# Patient Record
Sex: Female | Born: 1970 | Race: White | Hispanic: No | Smoking: Former smoker
Health system: Southern US, Community
[De-identification: ages and names within clinical notes are randomized; demographics above are authoritative.]

## PROBLEM LIST (undated history)

## (undated) DIAGNOSIS — E785 Hyperlipidemia, unspecified: Secondary | ICD-10-CM

## (undated) DIAGNOSIS — E119 Type 2 diabetes mellitus without complications: Secondary | ICD-10-CM

## (undated) HISTORY — DX: Hyperlipidemia, unspecified: E78.5

## (undated) HISTORY — PX: ABDOMINAL HYSTERECTOMY: SHX81

## (undated) HISTORY — DX: Type 2 diabetes mellitus without complications: E11.9

## (undated) HISTORY — PX: CHOLECYSTECTOMY: SHX55

---

## 1996-10-11 HISTORY — PX: HERNIA REPAIR: SHX51

## 2008-08-02 ENCOUNTER — Emergency Department: Payer: Self-pay | Admitting: Emergency Medicine

## 2010-03-20 ENCOUNTER — Emergency Department: Payer: Self-pay | Admitting: Emergency Medicine

## 2010-05-23 ENCOUNTER — Emergency Department: Payer: Self-pay | Admitting: Emergency Medicine

## 2010-06-12 ENCOUNTER — Emergency Department: Payer: Self-pay | Admitting: Emergency Medicine

## 2013-07-03 ENCOUNTER — Emergency Department: Payer: Self-pay | Admitting: Emergency Medicine

## 2013-07-03 LAB — URINALYSIS, COMPLETE
Bacteria: NONE SEEN
Bilirubin,UR: NEGATIVE
Glucose,UR: NEGATIVE mg/dL (ref 0–75)
Ketone: NEGATIVE
Leukocyte Esterase: NEGATIVE
Nitrite: NEGATIVE
Ph: 6 (ref 4.5–8.0)
Protein: NEGATIVE
RBC,UR: 2 /HPF (ref 0–5)
Specific Gravity: 1.011 (ref 1.003–1.030)
Squamous Epithelial: 1
WBC UR: 2 /HPF (ref 0–5)

## 2013-07-03 LAB — CBC
HCT: 36.2 % (ref 35.0–47.0)
HGB: 12.1 g/dL (ref 12.0–16.0)
MCH: 26.7 pg (ref 26.0–34.0)
MCHC: 33.5 g/dL (ref 32.0–36.0)
MCV: 80 fL (ref 80–100)
Platelet: 347 10*3/uL (ref 150–440)
RBC: 4.53 10*6/uL (ref 3.80–5.20)
RDW: 16.1 % — ABNORMAL HIGH (ref 11.5–14.5)
WBC: 12.5 10*3/uL — ABNORMAL HIGH (ref 3.6–11.0)

## 2013-07-03 LAB — COMPREHENSIVE METABOLIC PANEL
Albumin: 3.4 g/dL (ref 3.4–5.0)
Alkaline Phosphatase: 123 U/L (ref 50–136)
Anion Gap: 6 — ABNORMAL LOW (ref 7–16)
BUN: 11 mg/dL (ref 7–18)
Bilirubin,Total: 0.5 mg/dL (ref 0.2–1.0)
Calcium, Total: 9.2 mg/dL (ref 8.5–10.1)
Chloride: 103 mmol/L (ref 98–107)
Co2: 27 mmol/L (ref 21–32)
Creatinine: 1 mg/dL (ref 0.60–1.30)
EGFR (African American): >60
EGFR (Non-African Amer.): >60
Glucose: 153 mg/dL — ABNORMAL HIGH (ref 65–99)
Osmolality: 274 (ref 275–301)
Potassium: 3.6 mmol/L (ref 3.5–5.1)
SGOT(AST): 17 U/L (ref 15–37)
SGPT (ALT): 21 U/L (ref 12–78)
Sodium: 136 mmol/L (ref 136–145)
Total Protein: 8 g/dL (ref 6.4–8.2)

## 2014-03-03 ENCOUNTER — Emergency Department: Payer: Self-pay | Admitting: Emergency Medicine

## 2014-03-03 LAB — BASIC METABOLIC PANEL
Anion Gap: 8 (ref 7–16)
BUN: 10 mg/dL (ref 7–18)
Calcium, Total: 8.9 mg/dL (ref 8.5–10.1)
Chloride: 104 mmol/L (ref 98–107)
Co2: 27 mmol/L (ref 21–32)
Creatinine: 0.77 mg/dL (ref 0.60–1.30)
EGFR (African American): >60
EGFR (Non-African Amer.): >60
Glucose: 113 mg/dL — ABNORMAL HIGH (ref 65–99)
Osmolality: 277 (ref 275–301)
Potassium: 3.7 mmol/L (ref 3.5–5.1)
Sodium: 139 mmol/L (ref 136–145)

## 2014-03-03 LAB — CBC
HCT: 37.1 % (ref 35.0–47.0)
HGB: 11.9 g/dL — ABNORMAL LOW (ref 12.0–16.0)
MCH: 26.1 pg (ref 26.0–34.0)
MCHC: 32.1 g/dL (ref 32.0–36.0)
MCV: 81 fL (ref 80–100)
Platelet: 310 10*3/uL (ref 150–440)
RBC: 4.56 10*6/uL (ref 3.80–5.20)
RDW: 17 % — ABNORMAL HIGH (ref 11.5–14.5)
WBC: 7.5 10*3/uL (ref 3.6–11.0)

## 2014-03-03 LAB — TROPONIN I: Troponin-I: <0.02 ng/mL

## 2014-05-15 ENCOUNTER — Emergency Department: Payer: Self-pay | Admitting: Emergency Medicine

## 2014-05-15 LAB — URINALYSIS, COMPLETE
Bilirubin,UR: NEGATIVE
Glucose,UR: NEGATIVE mg/dL (ref 0–75)
Ketone: NEGATIVE
Nitrite: POSITIVE
Ph: 5 (ref 4.5–8.0)
Protein: 100
RBC,UR: 31 /HPF (ref 0–5)
Specific Gravity: 1.013 (ref 1.003–1.030)
Squamous Epithelial: 10
WBC UR: 632 /HPF (ref 0–5)

## 2014-05-18 LAB — URINE CULTURE

## 2015-02-25 DIAGNOSIS — R519 Headache, unspecified: Secondary | ICD-10-CM | POA: Insufficient documentation

## 2015-09-18 DIAGNOSIS — G44229 Chronic tension-type headache, not intractable: Secondary | ICD-10-CM | POA: Insufficient documentation

## 2016-10-17 ENCOUNTER — Emergency Department
Admission: EM | Admit: 2016-10-17 | Discharge: 2016-10-17 | Disposition: A | Discharge status: Home / Self Care | Payer: Self-pay | Attending: Emergency Medicine | Admitting: Emergency Medicine

## 2016-10-17 DIAGNOSIS — B9789 Other viral agents as the cause of diseases classified elsewhere: Secondary | ICD-10-CM

## 2016-10-17 DIAGNOSIS — J069 Acute upper respiratory infection, unspecified: Secondary | ICD-10-CM | POA: Insufficient documentation

## 2016-10-17 MED ORDER — FEXOFENADINE-PSEUDOEPHED ER 60-120 MG PO TB12: 1.0000 | ORAL_TABLET | Freq: Two times a day (BID) | ORAL | 0 refills | Status: DC

## 2016-10-17 MED ORDER — BENZONATATE 100 MG PO CAPS: 200.0000 mg | ORAL_CAPSULE | Freq: Three times a day (TID) | ORAL | 0 refills | Status: DC

## 2016-10-17 NOTE — ED Notes (Signed)
See triage note states she has been taking care of grandchild which was dx'd with URI this week  Now has been having some sinus pressure congestion and cough

## 2016-10-17 NOTE — ED Triage Notes (Signed)
Pt reports having nasal congestion and drainage since Thursday - non-productive cough - pt reports that both her grandchildren have had the same illness - appears in no acute distress at this time

## 2016-10-17 NOTE — ED Provider Notes (Signed)
[facility] Emergency Department Provider Note   ____________________________________________   First MD Initiated Contact with Patient 10/17/16 1403     (approximate)  I have reviewed the triage vital signs and the nursing notes.   HISTORY  Chief Complaint Cough and Nasal Congestion    HPI Lisa Howell is a 46 y.o. female patient complain of nasal congestion, postnasal drainage, and cough for 3 days. Patient denies nausea vomiting diarrhea. Patient states she's been exposed to her grandchildren who had the same illness. No palliative measures taken for complaint. Patient denies pain with this complaint.  History reviewed. No pertinent past medical history.  There are no active problems to display for this patient.   Past Surgical History:  Procedure Laterality Date  . CHOLECYSTECTOMY      Prior to Admission medications   Medication Sig Start Date End Date Taking? Authorizing Provider  benzonatate (TESSALON PERLES) 100 MG capsule Take 2 capsules (200 mg total) by mouth 3 (three) times daily. 10/17/16   Sable Feil, PA-C  fexofenadine-pseudoephedrine (ALLEGRA-D) 60-120 MG 12 hr tablet Take 1 tablet by mouth 2 (two) times daily. 10/17/16   Sable Feil, PA-C    Allergies Patient has no known allergies.  No family history on file.  Social History Social History  Substance Use Topics  . Smoking status: Never Smoker  . Smokeless tobacco: Never Used  . Alcohol use No    Review of Systems Constitutional: No fever/chills Eyes: No visual changes. ENT: Nasal congestion  Cardiovascular: Denies chest pain. Respiratory: Denies shortness of breath. Nonproductive cough Gastrointestinal: No abdominal pain.  No nausea, no vomiting.  No diarrhea.  No constipation. Genitourinary: Negative for dysuria. Musculoskeletal: Negative for back pain. Skin: Negative for rash. Neurological: Negative for headaches, focal weakness or  numbness.   ____________________________________________   PHYSICAL EXAM:  VITAL SIGNS: ED Triage Vitals [10/17/16 1335]  Enc Vitals Group     BP (!) 146/91     Pulse Rate 93     Resp 16     Temp 98.1 F (36.7 C)     Temp Source Oral     SpO2 97 %     Weight 235 lb (106.6 kg)     Height 5\' 1"  (1.549 m)     Head Circumference      Peak Flow      Pain Score 0     Pain Loc      Pain Edu?      Excl. in [place]?     Constitutional: Alert and oriented. Well appearing and in no acute distress. Eyes: Conjunctivae are normal. PERRL. EOMI. Head: Atraumatic. Nose:Bilateral maxillary guarding. Edematous nasal turbinates. Clear rhinorrhea. Mouth/Throat: Mucous membranes are moist.  Oropharynx non-erythematous. Postnasal drainage. Neck: No stridor.  No cervical spine tenderness to palpation. Hematological/Lymphatic/Immunilogical: No cervical lymphadenopathy. Cardiovascular: Normal rate, regular rhythm. Grossly normal heart sounds.  Good peripheral circulation. Respiratory: Normal respiratory effort.  No retractions. Lungs CTAB. Nonproductive cough with deep inspirations. Gastrointestinal: Soft and nontender. No distention. No abdominal bruits. No CVA tenderness. Musculoskeletal: No lower extremity tenderness nor edema.  No joint effusions. Neurologic:  Normal speech and language. No gross focal neurologic deficits are appreciated. No gait instability. Skin:  Skin is warm, dry and intact. No rash noted. Psychiatric: Mood and affect are normal. Speech and behavior are normal.  ____________________________________________   LABS (all labs ordered are listed, but only abnormal results are displayed)  Labs Reviewed - No data to display ____________________________________________  EKG   ____________________________________________  RADIOLOGY   ____________________________________________   PROCEDURES  Procedure(s) performed: None  Procedures  Critical Care performed:  No  ____________________________________________   INITIAL IMPRESSION / ASSESSMENT AND PLAN /  Patient given a work note. [facility].ED COURSE  Pertinent labs & imaging results that were available during my care of the patient were reviewed by me and considered in my medical decision making (see chart for details).  Upper respiratory infection with cough. Patient given discharge care instructions. Patient given a prescription for Sudafed and Tessalon Perles.  Clinical Course      ____________________________________________   FINAL CLINICAL IMPRESSION(S) / ED DIAGNOSES  Final diagnoses:  Viral URI with cough      NEW MEDICATIONS STARTED DURING THIS VISIT:  New Prescriptions   BENZONATATE (TESSALON PERLES) 100 MG CAPSULE    Take 2 capsules (200 mg total) by mouth 3 (three) times daily.   FEXOFENADINE-PSEUDOEPHEDRINE (ALLEGRA-D) 60-120 MG 12 HR TABLET    Take 1 tablet by mouth 2 (two) times daily.     Note:  This document was prepared using Dragon voice recognition software and may include unintentional dictation errors.    Sable Feil, PA-C 10/17/16 1415    Eula Listen, MD 10/17/16 [phone]

## 2016-11-07 DIAGNOSIS — X501XXA Overexertion from prolonged static or awkward postures, initial encounter: Secondary | ICD-10-CM | POA: Insufficient documentation

## 2016-11-07 DIAGNOSIS — Y999 Unspecified external cause status: Secondary | ICD-10-CM | POA: Insufficient documentation

## 2016-11-07 DIAGNOSIS — J069 Acute upper respiratory infection, unspecified: Secondary | ICD-10-CM | POA: Insufficient documentation

## 2016-11-07 DIAGNOSIS — S29012A Strain of muscle and tendon of back wall of thorax, initial encounter: Secondary | ICD-10-CM | POA: Insufficient documentation

## 2016-11-07 DIAGNOSIS — Y929 Unspecified place or not applicable: Secondary | ICD-10-CM | POA: Insufficient documentation

## 2016-11-07 DIAGNOSIS — Y9389 Activity, other specified: Secondary | ICD-10-CM | POA: Insufficient documentation

## 2016-11-08 ENCOUNTER — Encounter: Payer: Self-pay | Admitting: Emergency Medicine

## 2016-11-08 ENCOUNTER — Emergency Department: Payer: Self-pay

## 2016-11-08 ENCOUNTER — Emergency Department
Admission: EM | Admit: 2016-11-08 | Discharge: 2016-11-08 | Disposition: A | Discharge status: Home / Self Care | Payer: Self-pay | Attending: Emergency Medicine | Admitting: Emergency Medicine

## 2016-11-08 DIAGNOSIS — B9789 Other viral agents as the cause of diseases classified elsewhere: Secondary | ICD-10-CM

## 2016-11-08 DIAGNOSIS — J069 Acute upper respiratory infection, unspecified: Secondary | ICD-10-CM

## 2016-11-08 DIAGNOSIS — S29019A Strain of muscle and tendon of unspecified wall of thorax, initial encounter: Secondary | ICD-10-CM

## 2016-11-08 MED ORDER — GUAIFENESIN-CODEINE 100-10 MG/5ML PO SOLN: 5.0000 mL | ORAL | 0 refills | Status: DC | PRN

## 2016-11-08 MED ORDER — IBUPROFEN 600 MG PO TABS: 600.0000 mg | ORAL_TABLET | Freq: Three times a day (TID) | ORAL | 0 refills | Status: DC | PRN

## 2016-11-08 MED ORDER — DIAZEPAM 2 MG PO TABS: 2.0000 mg | ORAL_TABLET | Freq: Three times a day (TID) | ORAL | 0 refills | Status: DC | PRN

## 2016-11-08 NOTE — ED Triage Notes (Addendum)
Pt c/o sinus congestion/cough for 4 weeks; denies fever; several days ago she felt popping sensation to right mid back during coughing spell; now painful to move; short of breath with activity; lungs clear in triage; pt seen here 3 weeks ago and prescribed tessalon pearles and allegra D; has taken with no relief; has not followed up with her doctor

## 2016-11-08 NOTE — Discharge Instructions (Signed)
Follow-up with [facility] or your primary care if any continued problems. Use ice or heat to your back as needed for comfort. Begin taking diazepam 2 mg 1 tablet 3 times a day for the next 3 days. Ibuprofen with food as needed for pain and inflammation. Robitussin-AC for cough. This medication has a narcotic in it and should not be taken while driving or operating machinery.

## 2016-11-08 NOTE — ED Provider Notes (Signed)
[facility] Emergency Department Provider Note   ____________________________________________   First MD Initiated Contact with Patient 11/08/16 [phone]     (approximate)  I have reviewed the triage vital signs and the nursing notes.   HISTORY  Chief Complaint Cough; Shortness of Breath; Back Pain; and Nasal Congestion   HPI Lisa Howell is a 46 y.o. female is here complaining of cough and congestion for approximately 4 weeks. Patient states that she also felt a popping sensation in her right mid back during a coughing spell recently and now it is painful for her to move. Patient states she has some shortness of breath with activity. She was seen here 3 weeks ago for upper respiratory infection at which time she was given Allegra-D and Tessalon Perles. Patient states that the Allegra-D help with her symptoms. She cannot see any improvement with the Tessalon as far as controlling her cough. She denies any continued fever or chills. Patient is a former smoker that discontinued smoking approximately 19 years ago. She denies any previous injury to her back and states that it only started with coughing. Patient rates her pain as a 10 over 10.   History reviewed. No pertinent past medical history.  There are no active problems to display for this patient.   Past Surgical History:  Procedure Laterality Date  . ABDOMINAL HYSTERECTOMY    . CESAREAN SECTION    . CHOLECYSTECTOMY      Prior to Admission medications   Medication Sig Start Date End Date Taking? Authorizing Provider  diazepam (VALIUM) 2 MG tablet Take 1 tablet (2 mg total) by mouth every 8 (eight) hours as needed for muscle spasms. 11/08/16   Johnn Hai, PA-C  guaiFENesin-codeine 100-10 MG/5ML syrup Take 5 mLs by mouth every 4 (four) hours as needed. 11/08/16   Johnn Hai, PA-C  ibuprofen (ADVIL,MOTRIN) 600 MG tablet Take 1 tablet (600 mg total) by mouth every 8 (eight) hours as needed.  11/08/16   Johnn Hai, PA-C    Allergies Patient has no known allergies.  History reviewed. No pertinent family history.  Social History Social History  Substance Use Topics  . Smoking status: Never Smoker  . Smokeless tobacco: Never Used  . Alcohol use No    Review of Systems Constitutional: No fever/chills Eyes: No visual changes. ENT: No sore throat. Cardiovascular: Denies chest pain. Respiratory: Denies shortness of breath. Positive for cough. Gastrointestinal: No abdominal pain.  No nausea, no vomiting.   Genitourinary: Negative for dysuria. Musculoskeletal: Positive for  back pain. Skin: Negative for rash. Neurological: Negative for headaches, focal weakness or numbness.  10-point ROS otherwise negative.  ____________________________________________   PHYSICAL EXAM:  VITAL SIGNS: ED Triage Vitals  Enc Vitals Group     BP 11/08/16 0008 (!) 145/89     Pulse Rate 11/08/16 0008 91     Resp 11/08/16 0008 17     Temp 11/08/16 0008 98.3 F (36.8 C)     Temp Source 11/08/16 0008 Oral     SpO2 11/08/16 0008 97 %     Weight 11/08/16 0008 235 lb (106.6 kg)     Height 11/08/16 0008 5\' 1"  (1.549 m)     Head Circumference --      Peak Flow --      Pain Score 11/08/16 0019 10     Pain Loc --      Pain Edu? --      Excl. in [place]? --  Constitutional: Alert and oriented. Well appearing and in no acute distress. Eyes: Conjunctivae are normal. PERRL. EOMI. Head: Atraumatic. Nose: No congestion/rhinnorhea. Mouth/Throat: Mucous membranes are moist.  Oropharynx non-erythematous. Neck: No stridor.   Hematological/Lymphatic/Immunilogical: No cervical lymphadenopathy. Cardiovascular: Normal rate, regular rhythm. Grossly normal heart sounds.  Good peripheral circulation. Respiratory: Normal respiratory effort.  No retractions. Lungs CTAB.Case COUGH is heard. Gastrointestinal: Soft and nontender. No distention.  Musculoskeletal: On examination of the back there is no  gross deformity noted. There is some tenderness of the paravertebral muscles bilaterally. There is no active muscle spasm seen. There is some difficulty with range of motion secondary to pain and patient does look uncomfortable sitting in the exam room. Patient is still Able to move upper and lower extremities without any difficulty and normal gait was noted. Neurologic:  Normal speech and language. No gross focal neurologic deficits are appreciated. No gait instability. Skin:  Skin is warm, dry and intact. No rash noted. Psychiatric: Mood and affect are normal. Speech and behavior are normal.  ____________________________________________   LABS (all labs ordered are listed, but only abnormal results are displayed)  Labs Reviewed - No data to display  RADIOLOGY X-ray per radiologist shows no active cardiopulmonary disease. I, Johnn Hai, personally viewed and evaluated these images (plain radiographs) as part of my medical decision making, as well as reviewing the written report by the radiologist.  ____________________________________________   PROCEDURES  Procedure(s) performed: None  Procedures  Critical Care performed: No  ____________________________________________   INITIAL IMPRESSION / ASSESSMENT AND PLAN / ED COURSE  Pertinent labs & imaging results that were available during my care of the patient were reviewed by me and considered in my medical decision making (see chart for details).  Patient was reassured that her chest x-ray did not show any signs of pneumonia. We discussed the probability that she did pull muscles with all the forceful coughing that she has been doing. Patient was given a prescription for Valium 2 mg one every 8 hours as needed for muscle spasms for the next 3 days. She is also given a prescription for guaifenesin with codeine as needed for cough and congestion. She will also take over-the-counter ibuprofen as needed for pain and inflammation.  She is to follow-up with [facility] or her primary care doctor if any continued problems. She is encouraged to use ice or heat to her back as needed for comfort.      ____________________________________________   FINAL CLINICAL IMPRESSION(S) / ED DIAGNOSES  Final diagnoses:  Acute thoracic myofascial strain, initial encounter  Viral URI with cough      NEW MEDICATIONS STARTED DURING THIS VISIT:  Discharge Medication List as of 11/08/2016  7:25 AM    START taking these medications   Details  diazepam (VALIUM) 2 MG tablet Take 1 tablet (2 mg total) by mouth every 8 (eight) hours as needed for muscle spasms., Starting Mon 11/08/2016, Print    guaiFENesin-codeine 100-10 MG/5ML syrup Take 5 mLs by mouth every 4 (four) hours as needed., Starting Mon 11/08/2016, Print    ibuprofen (ADVIL,MOTRIN) 600 MG tablet Take 1 tablet (600 mg total) by mouth every 8 (eight) hours as needed., Starting Mon 11/08/2016, Print         Note:  This document was prepared using Dragon voice recognition software and may include unintentional dictation errors.    Johnn Hai, PA-C 11/08/16 1355    Lavonia Drafts, MD 11/08/16 [phone]

## 2016-11-08 NOTE — ED Notes (Signed)
See triage note  States she was seen couple of weeks ago states she has not had any fever or other sx's   But is not able to get rid of cough  States cough is non prod  Felt a pop to mid back  Increased pain with movement  Lungs clear

## 2017-08-16 ENCOUNTER — Encounter: Payer: Self-pay | Admitting: Emergency Medicine

## 2017-08-16 ENCOUNTER — Emergency Department
Admission: EM | Admit: 2017-08-16 | Discharge: 2017-08-16 | Disposition: A | Discharge status: Home / Self Care | Payer: Self-pay | Attending: Emergency Medicine | Admitting: Emergency Medicine

## 2017-08-16 ENCOUNTER — Emergency Department: Payer: Self-pay

## 2017-08-16 DIAGNOSIS — J069 Acute upper respiratory infection, unspecified: Secondary | ICD-10-CM | POA: Insufficient documentation

## 2017-08-16 MED ORDER — GUAIFENESIN-CODEINE 100-10 MG/5ML PO SOLN: 10.0000 mL | Freq: Four times a day (QID) | ORAL | 0 refills | Status: AC | PRN

## 2017-08-16 NOTE — ED Triage Notes (Signed)
Patient presents to ED via POV from home with c/o cough, nasal drainage and headache since Sunday. Even and non labored respirations noted.

## 2017-08-16 NOTE — ED Provider Notes (Signed)
[facility] Emergency Department Provider Note  ____________________________________________  Time seen: Approximately 6:44 PM  I have reviewed the triage vital signs and the nursing notes.   HISTORY  Chief Complaint Cough   HPI Lisa Howell is a 46 y.o. female who presents to the emergency department for treatment and evaluation ofcough, congestion, headache, and low-grade intermittent fever for the past 2 days. 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 She denies a significant social history of smoking and denies medical history of diabetes or other chronic illness. She takes ibuprofen on a routine basis secondary to frequent headaches but otherwise has no daily medications. She reports no known drug allergies.   History reviewed. No pertinent past medical history.  There are no active problems to display for this patient.   Past Surgical History:  Procedure Laterality Date  . ABDOMINAL HYSTERECTOMY    . CESAREAN SECTION    . CHOLECYSTECTOMY      Prior to Admission medications   Medication Sig Start Date End Date Taking? Authorizing Provider  diazepam (VALIUM) 2 MG tablet Take 1 tablet (2 mg total) by mouth every 8 (eight) hours as needed for muscle spasms. 11/08/16   Johnn Hai, PA-C  guaiFENesin-codeine 100-10 MG/5ML syrup Take 10 mLs every 6 (six) hours as needed for up to 3 days by mouth for cough. 08/16/17 08/19/17  Lonzo Saulter, Dessa Phi, FNP  ibuprofen (ADVIL,MOTRIN) 600 MG tablet Take 1 tablet (600 mg total) by mouth every 8 (eight) hours as needed. 11/08/16   Johnn Hai, PA-C    Allergies Patient has no known allergies.  No family history on file.  Social History Social History   Tobacco Use  . Smoking status: Never Smoker  . Smokeless tobacco: Never Used  Substance Use Topics  . Alcohol use: No  . Drug use: No     Review of Systems Constitutional: Positive for fever/chills ENT: Negative for sore throat. Cardiovascular: Denies chest pain. Respiratory: Negative for shortness of breath. Positive for cough. Gastrointestinal: Negative for nausea,  no vomiting.  No diarrhea.  Musculoskeletal: Positive for body aches Skin: Negative for rash. Neurological: Positive for headaches ____________________________________________   PHYSICAL EXAM:  VITAL SIGNS: ED Triage Vitals  Enc Vitals Group     BP 08/16/17 1730 98/72     Pulse Rate 08/16/17 1730 (!) 106     Resp 08/16/17 1730 16     Temp 08/16/17 1730 98.3 F (36.8 C)     Temp Source 08/16/17 1730 Oral     SpO2 08/16/17 1730 96 %     Weight 08/16/17 1732 225 lb (102.1 kg)     Height 08/16/17 1732 5\' 2"  (1.575 m)     Head Circumference --      Peak Flow --      Pain Score --      Pain Loc --      Pain Edu? --      Excl. in [place]? --     Constitutional: Alert and oriented. Acutely ill appearing and in no acute distress. Eyes: Conjunctivae are normal. EOMI. Ears: Bilateral tympanic membranes. Normal Nose: Pansinus congestion noted; clear rhinnorhea. Mouth/Throat: Mucous membranes are moist.  Oropharynx mildly erythematous. Tonsils not visualized. Neck: No stridor.  Lymphatic: No cervical lymphadenopathy. Cardiovascular: Normal rate, regular rhythm. Good peripheral circulation. Respiratory: Normal respiratory effort.  No retractions. Rhonchi noted in the  right upper lobe with faint expiratory wheezes, otherwise clear. Gastrointestinal: Soft and nontender.  Musculoskeletal: FROM x 4 extremities.  Neurologic:  Normal speech and language.  Skin:  Skin is warm, dry and intact. No rash noted. Psychiatric: Mood and affect are normal. Speech and behavior are normal.  ____________________________________________   LABS (all labs ordered are listed, but only abnormal results are displayed)  Labs Reviewed - No data to  display ____________________________________________  EKG  Not indicated ____________________________________________  RADIOLOGY  Chest x-ray negative for acute cardiopulmonary abnormality per radiology. ____________________________________________   PROCEDURES  Procedure(s) performed: None  Critical Care performed: No ____________________________________________   INITIAL IMPRESSION / ASSESSMENT AND PLAN / ED COURSE  46 year old female presenting to the emergency department for evaluation and treatment of symptoms consistent with a viral upper respiratory infection for the past 2-3 days. She has had significant exposure to the same. Chest x-ray does not show any concern for pneumonia. She'll be given a prescription for guaifenesin with codeine and encouraged to continue taking her ibuprofen for headache. She was instructed to follow-up with the primary care provider of her choice for symptoms that are not improving over the next few days or return to the emergency department for symptoms that change or worsen.  Pertinent labs & imaging results that were available during my care of the patient were reviewed by me and considered in my medical decision making (see chart for details).  If controlled substance prescribed during this visit, 12 month history viewed on the [place] prior to issuing an initial prescription for Schedule II or III opiod. ____________________________________________   FINAL CLINICAL IMPRESSION(S) / ED DIAGNOSES  Final diagnoses:  Acute upper respiratory infection    Note:  This document was prepared using Dragon voice recognition software and may include unintentional dictation errors.     Victorino Dike, FNP 08/16/17 2025    Eula Listen, MD 08/16/17 [phone]

## 2017-08-16 NOTE — ED Notes (Signed)
Cough, congestion, headache xfew days, pt works at daycare center. Pt low grade fevers intermit.

## 2017-09-16 ENCOUNTER — Emergency Department: Payer: Self-pay

## 2017-09-16 ENCOUNTER — Emergency Department
Admission: EM | Admit: 2017-09-16 | Discharge: 2017-09-16 | Disposition: A | Discharge status: Home / Self Care | Payer: Self-pay | Attending: Emergency Medicine | Admitting: Emergency Medicine

## 2017-09-16 DIAGNOSIS — R059 Cough, unspecified: Secondary | ICD-10-CM

## 2017-09-16 DIAGNOSIS — R0602 Shortness of breath: Secondary | ICD-10-CM

## 2017-09-16 DIAGNOSIS — J988 Other specified respiratory disorders: Secondary | ICD-10-CM | POA: Insufficient documentation

## 2017-09-16 DIAGNOSIS — R05 Cough: Secondary | ICD-10-CM | POA: Insufficient documentation

## 2017-09-16 DIAGNOSIS — J3489 Other specified disorders of nose and nasal sinuses: Secondary | ICD-10-CM | POA: Insufficient documentation

## 2017-09-16 MED ORDER — PREDNISONE 20 MG PO TABS: 60.0000 mg | ORAL_TABLET | Freq: Every day | ORAL | 0 refills | Status: DC

## 2017-09-16 MED ORDER — GUAIFENESIN ER 600 MG PO TB12: 600.0000 mg | ORAL_TABLET | Freq: Two times a day (BID) | ORAL | 0 refills | Status: DC | PRN

## 2017-09-16 MED ORDER — BENZONATATE 100 MG PO CAPS: 100.0000 mg | ORAL_CAPSULE | Freq: Four times a day (QID) | ORAL | 0 refills | Status: DC | PRN

## 2017-09-16 MED ORDER — AZITHROMYCIN 250 MG PO TABS: ORAL_TABLET | ORAL | 0 refills | Status: AC

## 2017-09-16 MED ORDER — ALBUTEROL SULFATE HFA 108 (90 BASE) MCG/ACT IN AERS: 2.0000 | INHALATION_SPRAY | Freq: Four times a day (QID) | RESPIRATORY_TRACT | 0 refills | Status: DC | PRN

## 2017-09-16 NOTE — Discharge Instructions (Signed)
Please return to the emergency department for severe pain, shortness of breath, lightheadedness or fainting, fever, or any other symptoms concerning to you.

## 2017-09-16 NOTE — ED Provider Notes (Signed)
[name] Emergency Department Provider Note  ____________________________________________  Time seen: Approximately 9:55 PM  I have reviewed the triage vital signs and the nursing notes.   HISTORY  Chief Complaint Shortness of Breath    HPI Lisa Howell is a 46 y.o. female , nonsmoker, presenting w/ 1 month of cough, now w/ congestion, rhinorrhea and positional sob.  The she reports that she has had dry nonproductive cough for the past month.  She has been treated with cough medicine with codeine, which did not help.  Over the last 2 or 3 days, she has noted congestion and clear rhinorrhea.  At night, when she lays down, her congestion pulls and she feels short of breath but if she sits up and bangs on her chest, the symptoms resolve completely.  She has not had any fever or chills, sore throat, ear pain, lightheadedness or syncope.  She has no hx of seasonal allergies or GERD.  No past medical history on file.  There are no active problems to display for this patient.   Past Surgical History:  Procedure Laterality Date  . ABDOMINAL HYSTERECTOMY    . CESAREAN SECTION    . CHOLECYSTECTOMY      Current Outpatient Rx  . Order #: [id number] Class: Print  . Order #: [id number] Class: Print  . Order #: [id number] Class: Print  . Order #: [id number] Class: Print  . Order #: [id number] Class: Print  . Order #: [id number] Class: Print  . Order #: [id number] Class: Print    Allergies Patient has no known allergies.  No family history on file.  Social History Social History   Tobacco Use  . Smoking status: Never Smoker  . Smokeless tobacco: Never Used  Substance Use Topics  . Alcohol use: No  . Drug use: No    Review of Systems Constitutional: No fever/chills. No lightheadedness or syncope. Eyes: No visual changes. No eye discharge. ENT: No sore throat. +congestion and rhinorrhea. No ear pain. Cardiovascular: Denies chest pain. Denies  palpitations. Respiratory: + positional shortness of breath.  No cough. Gastrointestinal: No abdominal pain.  No nausea, no vomiting.  No diarrhea.  No constipation. Genitourinary: Negative for dysuria. Musculoskeletal: Negative for back pain. Skin: Negative for rash. Neurological: Negative for headaches. No focal numbness, tingling or weakness.     ____________________________________________   PHYSICAL EXAM:  VITAL SIGNS: ED Triage Vitals  Enc Vitals Group     BP 09/16/17 2054 137/73     Pulse Rate 09/16/17 2054 100     Resp 09/16/17 2054 (!) 22     Temp 09/16/17 2054 99.6 F (37.6 C)     Temp Source 09/16/17 2054 Oral     SpO2 09/16/17 2054 96 %     Weight 09/16/17 2059 227 lb (103 kg)     Height 09/16/17 2059 5\' 2"  (1.575 m)     Head Circumference --      Peak Flow --      Pain Score --      Pain Loc --      Pain Edu? --      Excl. in [place]? --     Constitutional: Alert and oriented.  The uncomfortable appearing but in no acute distress. Answers questions appropriately. Eyes: Conjunctivae are normal.  EOMI. No scleral icterus.  No eye discharge. Head: Atraumatic. Nose: Old congestion/rhinnorhea. Mouth/Throat: Mucous membranes are moist.  Posterior pharyngeal erythema, tonsillar swelling or exudate.  The posterior palate is symmetric and the uvula is midline.  No drooling peer  Neck: No stridor.  Supple.  No meningismus. Cardiovascular: Normal rate, regular rhythm. No murmurs, rubs or gallops.  Respiratory: Normal respiratory effort.  No accessory muscle use or retractions. Lungs CTAB.  No wheezes, rales or ronchi. Gastrointestinal: Obese.  Soft, nontender and nondistended.  No guarding or rebound.  No peritoneal signs. Musculoskeletal: No LE edema.  Neurologic:  A&Ox3.  Speech is clear.  Face and smile are symmetric.  EOMI.  Moves all extremities well. Skin:  Skin is warm, dry and intact. No rash noted. Psychiatric: Mood and affect are normal. Speech and behavior are  normal.  Normal judgement.  ____________________________________________   LABS (all labs ordered are listed, but only abnormal results are displayed)  Labs Reviewed - No data to display ____________________________________________  EKG  ED ECG REPORT I, Eula Listen, the attending physician, personally viewed and interpreted this ECG.   Date: 09/16/2017  EKG Time: 2048  Rate: 105  Rhythm: sinus tachycardia  Axis: leftward  Intervals:none  ST&T Change: No STEMI    ____________________________________________  RADIOLOGY  Dg Chest 2 View  Result Date: 09/16/2017 CLINICAL DATA:  Cough and shortness of breath for 1 month. EXAM: CHEST  2 VIEW COMPARISON:  Chest radiograph August 16, 2017 FINDINGS: Cardiomediastinal silhouette is normal. No pleural effusions or focal consolidations. Mild bronchitic changes. Low inspiratory examination. Trachea projects midline and there is no pneumothorax. Soft tissue planes and included osseous structures are non-suspicious. Surgical clips in the included right abdomen compatible with cholecystectomy. IMPRESSION: Mild bronchitic changes without focal consolidation. Electronically Signed   By: Elon Alas M.D.   On: 09/16/2017 21:24    ____________________________________________   PROCEDURES  Procedure(s) performed: None  Procedures  Critical Care performed: No ____________________________________________   INITIAL IMPRESSION / ASSESSMENT AND PLAN / ED COURSE  Pertinent labs & imaging results that were available during my care of the patient were reviewed by me and considered in my medical decision making (see chart for details).  46 y.o. F, nonsmoker, with 1 month of coughing, now with congestion and rhinorrhea and positional shortness of breath.  Overall, the patient is hemodynamically stable and afebrile.  She has no abnormal cardiopulmonary findings on my examination, and her chest x-ray does not show a focal  consolidation consistent with pneumonia.  She does have some bronchitic changes, both clinically and on her x-ray.  She is not hypoxic or have any respiratory compromise today.  She likely has a viral URI.  Other causes for a persistent cough like this includes seasonal allergies and reflux disease.  At this time, the patient is stable for discharge home.  I will give her symptomatic treatment, as well as steroids and a Z-Pak for her bronchitis.  She understands return precautions as well as follow-up instructions.  ____________________________________________  FINAL CLINICAL IMPRESSION(S) / ED DIAGNOSES  Final diagnoses:  Cough  Congestion of upper airway  Rhinorrhea  Shortness of breath         NEW MEDICATIONS STARTED DURING THIS VISIT:  This SmartLink is deprecated. Use AVSMEDLIST instead to display the medication list for a patient.    Eula Listen, MD 09/16/17 2202

## 2017-09-16 NOTE — ED Triage Notes (Signed)
Pt states she has been coughing and felt shob for over one month. Pt states she was here previously and diagnosed with "walking pneumonia", took cough medicine without improvement in symptoms. Pt appears in no acute distress.

## 2018-02-23 ENCOUNTER — Other Ambulatory Visit: Payer: Self-pay

## 2018-02-23 ENCOUNTER — Encounter: Payer: Self-pay | Admitting: Emergency Medicine

## 2018-02-23 ENCOUNTER — Emergency Department
Admission: EM | Admit: 2018-02-23 | Discharge: 2018-02-23 | Disposition: A | Discharge status: Home / Self Care | Payer: Self-pay | Attending: Emergency Medicine | Admitting: Emergency Medicine

## 2018-02-23 DIAGNOSIS — Y999 Unspecified external cause status: Secondary | ICD-10-CM | POA: Insufficient documentation

## 2018-02-23 DIAGNOSIS — M542 Cervicalgia: Secondary | ICD-10-CM | POA: Insufficient documentation

## 2018-02-23 DIAGNOSIS — Y929 Unspecified place or not applicable: Secondary | ICD-10-CM | POA: Insufficient documentation

## 2018-02-23 DIAGNOSIS — X58XXXA Exposure to other specified factors, initial encounter: Secondary | ICD-10-CM | POA: Insufficient documentation

## 2018-02-23 DIAGNOSIS — M7918 Myalgia, other site: Secondary | ICD-10-CM

## 2018-02-23 DIAGNOSIS — M25511 Pain in right shoulder: Secondary | ICD-10-CM | POA: Insufficient documentation

## 2018-02-23 DIAGNOSIS — S239XXA Sprain of unspecified parts of thorax, initial encounter: Secondary | ICD-10-CM | POA: Insufficient documentation

## 2018-02-23 DIAGNOSIS — Y939 Activity, unspecified: Secondary | ICD-10-CM | POA: Insufficient documentation

## 2018-02-23 MED ORDER — NABUMETONE 750 MG PO TABS: 750.0000 mg | ORAL_TABLET | Freq: Two times a day (BID) | ORAL | 0 refills | Status: DC

## 2018-02-23 MED ORDER — CYCLOBENZAPRINE HCL 5 MG PO TABS: 5.0000 mg | ORAL_TABLET | Freq: Three times a day (TID) | ORAL | 0 refills | Status: DC | PRN

## 2018-02-23 NOTE — ED Triage Notes (Signed)
Pt states that she has been having neck pain since Saturday. Pt states that she is also having pain in her right arm and elbow. Pt states that she strained on Thursday but the pain has gotten worse since Saturday pt states that her pain increases with movement.. Pt is in NAD at this time.

## 2018-02-23 NOTE — ED Provider Notes (Signed)
[facility] Emergency Department Provider Note ____________________________________________  Time seen: 1625  I have reviewed the triage vital signs and the nursing notes.  HISTORY  Chief Complaint  Neck Pain  HPI Lisa Howell is a 47 y.o. female presents herself to the ED for evaluation of right greater than left neck and upper back pain.  Patient describes onset was Saturday morning upon awakening.  She denies any preceding injury, accident, trauma, or fall.  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  She describes spasm to the right upper back and neck pain that refers down the upper extremity to the elbow.  She denies any grip changes or distal hand swelling.  She has been taking Tylenol and an over-the-counter migraine medication without significant benefit.  She denies any ongoing chronic neck or back pain or any underlying degenerative disc disease.  Patient is right-hand dominant and describes pain to the right neck and trapezius region.  No chest pain, no shortness of breath, and no syncope is reported.  History reviewed. No pertinent past medical history.  There are no active problems to display for this patient.   Past Surgical History:  Procedure Laterality Date  . ABDOMINAL HYSTERECTOMY    . CESAREAN SECTION    . CHOLECYSTECTOMY      Prior to Admission medications   Medication Sig Start Date End Date Taking? Authorizing Provider  albuterol (PROVENTIL HFA;VENTOLIN HFA) 108 (90 Base) MCG/ACT inhaler Inhale 2 puffs into the lungs every 6 (six) hours as needed for wheezing or shortness of breath. 09/16/17   Eula Listen, MD  cyclobenzaprine (FLEXERIL) 5 MG tablet Take 1 tablet (5 mg total) by mouth 3 (three) times daily as needed for muscle spasms. 02/23/18   Leah Thornberry, Dannielle Karvonen, PA-C  nabumetone (RELAFEN) 750 MG tablet Take 1 tablet (750 mg total)  by mouth 2 (two) times daily. 02/23/18   Evany Schecter, Dannielle Karvonen, PA-C    Allergies Patient has no known allergies.  No family history on file.  Social History Social History   Tobacco Use  . Smoking status: Never Smoker  . Smokeless tobacco: Never Used  Substance Use Topics  . Alcohol use: No  . Drug use: No    Review of Systems  Constitutional: Negative for fever. Eyes: Negative for visual changes. ENT: Negative for sore throat. Cardiovascular: Negative for chest pain. Respiratory: Negative for shortness of breath. Gastrointestinal: Negative for abdominal pain, vomiting and diarrhea. Genitourinary: Negative for dysuria. Musculoskeletal: Positive for neck and upper back pain. Skin: Negative for rash. Neurological: Negative for headaches, focal weakness or numbness. ____________________________________________  PHYSICAL EXAM:  VITAL SIGNS: ED Triage Vitals [02/23/18 1542]  Enc Vitals Group     BP (!) 156/84     Pulse Rate 83     Resp 18     Temp 98.2 F (36.8 C)     Temp Source Oral     SpO2 96 %     Weight 215 lb (97.5 kg)     Height 5\' 1"  (1.549 m)     Head Circumference      Peak Flow      Pain Score 7     Pain Loc      Pain Edu?      Excl. in [place]?     Constitutional: Alert and oriented. Well appearing and in no distress. Head: Normocephalic and atraumatic. Neck: Supple. No  thyromegaly.  Palpable spasm or distracting midline tenderness is appreciated.  Patient with self-limited right lateral bending and right rotation. Cardiovascular: Normal rate, regular rhythm. Normal distal pulses. Respiratory: Normal respiratory effort. No wheezes/rales/rhonchi. Musculoskeletal: Normal spinal alignment without midline tenderness, spasm, deformity, or step-off.  Normal resistance testing to the rotator cuff bilaterally.  Decreased right upper extremity range of motion secondary to subjective complaints of pain.  Patient is tender to palpation to the right upper  trapezius and rhomboid musculature.  Nontender with normal range of motion in all extremities.  Neurologic: Cranial nerves II through XII grossly intact.  Normal UE DTRs bilaterally.  Normal gait without ataxia. Normal speech and language. No gross focal neurologic deficits are appreciated. Skin:  Skin is warm, dry and intact. No rash noted. ____________________________________________  INITIAL IMPRESSION / ASSESSMENT AND PLAN / ED COURSE  Patient with ED evaluation of right thoracic muscle strain and spasms. Her exam does not reveal any neuromuscular deficits or signs of shoulder derangement. She likely will respond to NSAIDs and muscle relaxants. She is discharged with activities as tolerated and follow-up instructions.  ____________________________________________  FINAL CLINICAL IMPRESSION(S) / ED DIAGNOSES  Final diagnoses:  Musculoskeletal pain  Thoracic back sprain, initial encounter      Melvenia Needles, PA-C 02/23/18 [place], [place], MD 02/25/18 [phone]

## 2018-02-23 NOTE — ED Notes (Addendum)
See triage note  States she developed some pain to neck and into arm  Denies any injury  Min relief with po meds

## 2018-02-23 NOTE — Discharge Instructions (Addendum)
Your exam is consistent with shoulder and midback muscle strain. Take the prescription meds as directed. Apply ice or moist heat to reduce pain and spasms. Follow-up with your provider for ongoing symptoms.

## 2018-09-17 ENCOUNTER — Emergency Department
Admission: EM | Admit: 2018-09-17 | Discharge: 2018-09-17 | Disposition: A | Discharge status: Home / Self Care | Payer: Self-pay | Attending: Emergency Medicine | Admitting: Emergency Medicine

## 2018-09-17 ENCOUNTER — Other Ambulatory Visit: Payer: Self-pay

## 2018-09-17 ENCOUNTER — Emergency Department: Payer: Self-pay

## 2018-09-17 DIAGNOSIS — M5412 Radiculopathy, cervical region: Secondary | ICD-10-CM | POA: Insufficient documentation

## 2018-09-17 DIAGNOSIS — M62838 Other muscle spasm: Secondary | ICD-10-CM | POA: Insufficient documentation

## 2018-09-17 LAB — BASIC METABOLIC PANEL
Anion gap: 8 (ref 5–15)
BUN: 12 mg/dL (ref 6–20)
CO2: 27 mmol/L (ref 22–32)
Calcium: 9.1 mg/dL (ref 8.9–10.3)
Chloride: 104 mmol/L (ref 98–111)
Creatinine, Ser: 1.11 mg/dL — ABNORMAL HIGH (ref 0.44–1.00)
GFR calc Af Amer: >60 mL/min (ref >60)
GFR calc non Af Amer: 58 mL/min — ABNORMAL LOW (ref >60)
Glucose, Bld: 134 mg/dL — ABNORMAL HIGH (ref 70–99)
Potassium: 3.6 mmol/L (ref 3.5–5.1)
Sodium: 139 mmol/L (ref 135–145)

## 2018-09-17 LAB — CBC WITH DIFFERENTIAL/PLATELET
Abs Immature Granulocytes: 0.06 10*3/uL (ref 0.00–0.07)
Basophils Absolute: 0 10*3/uL (ref 0.0–0.1)
Basophils Relative: 0 %
Eosinophils Absolute: 0.1 10*3/uL (ref 0.0–0.5)
Eosinophils Relative: 1 %
HCT: 38.9 % (ref 36.0–46.0)
Hemoglobin: 12.3 g/dL (ref 12.0–15.0)
Immature Granulocytes: 1 %
Lymphocytes Relative: 28 %
Lymphs Abs: 2.8 10*3/uL (ref 0.7–4.0)
MCH: 25.4 pg — ABNORMAL LOW (ref 26.0–34.0)
MCHC: 31.6 g/dL (ref 30.0–36.0)
MCV: 80.4 fL (ref 80.0–100.0)
Monocytes Absolute: 0.5 10*3/uL (ref 0.1–1.0)
Monocytes Relative: 5 %
Neutro Abs: 6.6 10*3/uL (ref 1.7–7.7)
Neutrophils Relative %: 65 %
Platelets: 356 10*3/uL (ref 150–400)
RBC: 4.84 MIL/uL (ref 3.87–5.11)
RDW: 14.8 % (ref 11.5–15.5)
WBC: 10.1 10*3/uL (ref 4.0–10.5)
nRBC: 0 % (ref 0.0–0.2)

## 2018-09-17 MED ORDER — BACLOFEN 10 MG PO TABS: 10.0000 mg | ORAL_TABLET | Freq: Three times a day (TID) | ORAL | 1 refills | Status: AC

## 2018-09-17 MED ORDER — PREDNISONE 10 MG (21) PO TBPK: ORAL_TABLET | ORAL | 0 refills | Status: DC

## 2018-09-17 NOTE — Discharge Instructions (Addendum)
Follow-up with your regular doctor if not better in 5 to 7 days.  Return emergency department worsening.  Take medication as prescribed.  You may also call Dr. Harlow Mares who is an orthopedic doctor for follow-up.

## 2018-09-17 NOTE — ED Provider Notes (Signed)
[facility] Emergency Department Provider Note  ____________________________________________   First MD Initiated Contact with Patient 09/17/18 1404     (approximate)  I have reviewed the triage vital signs and the nursing notes.   HISTORY  Chief Complaint Generalized Body Aches    HPI Lisa Howell is a 47 y.o. female presents emergency department complaining of pulled muscles and muscle spasms.  She states the symptoms started in her legs and now have moved up into her right arm.  She is also complaining of neck pain that radiates into the shoulders.  She had been seen by chiropractor and told that she needed to have an MRI done.  She states she was seen here and had x-rays which I cannot find on the chart.    History reviewed. No pertinent past medical history.  There are no active problems to display for this patient.   Past Surgical History:  Procedure Laterality Date  . ABDOMINAL HYSTERECTOMY    . CESAREAN SECTION    . CHOLECYSTECTOMY      Prior to Admission medications   Medication Sig Start Date End Date Taking? Authorizing Provider  albuterol (PROVENTIL HFA;VENTOLIN HFA) 108 (90 Base) MCG/ACT inhaler Inhale 2 puffs into the lungs every 6 (six) hours as needed for wheezing or shortness of breath. 09/16/17   Eula Listen, MD  baclofen (LIORESAL) 10 MG tablet Take 1 tablet (10 mg total) by mouth 3 (three) times daily. 09/17/18 09/17/19  Parth Mccormac, Linden Dolin, PA-C  predniSONE (STERAPRED UNI-PAK 21 TAB) 10 MG (21) TBPK tablet Take 6 pills on day one then decrease by 1 pill each day 09/17/18   Versie Starks, PA-C    Allergies Patient has no known allergies.  History reviewed. No pertinent family history.  Social History Social History   Tobacco Use  . Smoking status: Never Smoker  . Smokeless tobacco: Never Used  Substance Use Topics  . Alcohol use: No  . Drug use: No    Review of Systems  Constitutional: No  fever/chills Eyes: No visual changes. ENT: No sore throat. Respiratory: Denies cough Genitourinary: Negative for dysuria. Musculoskeletal: Negative for back pain.  Positive for neck pain and muscle aches Skin: Negative for rash.    ____________________________________________   PHYSICAL EXAM:  VITAL SIGNS: ED Triage Vitals [09/17/18 1226]  Enc Vitals Group     BP (!) 144/100     Pulse Rate 94     Resp 18     Temp 98.6 F (37 C)     Temp Source Oral     SpO2 94 %     Weight 220 lb (99.8 kg)     Height 5' 1"  (1.549 m)     Head Circumference      Peak Flow      Pain Score 10     Pain Loc      Pain Edu?      Excl. in [place]?     Constitutional: Alert and oriented. Well appearing and in no acute distress. Eyes: Conjunctivae are normal.  Head: Atraumatic. Nose: No congestion/rhinnorhea. Mouth/Throat: Mucous membranes are moist.   Neck:  supple no lymphadenopathy noted Cardiovascular: Normal rate, regular rhythm. Heart sounds are normal Respiratory: Normal respiratory effort.  No retractions, lungs c t a  GU: deferred Musculoskeletal: FROM all extremities, warm and well perfused, pain is reproduced with movement of the C-spine.  Right shoulder has a spasm along the trapezius.  Grips are equal bilaterally. Neurologic:  Normal  speech and language.  Skin:  Skin is warm, dry and intact. No rash noted. Psychiatric: Mood and affect are normal. Speech and behavior are normal.  ____________________________________________   LABS (all labs ordered are listed, but only abnormal results are displayed)  Labs Reviewed  CBC WITH DIFFERENTIAL/PLATELET - Abnormal; Notable for the following components:      Result Value   MCH 25.4 (*)    All other components within normal limits  BASIC METABOLIC PANEL - Abnormal; Notable for the following components:   Glucose, Bld 134 (*)    Creatinine, Ser 1.11 (*)    GFR calc non Af Amer 58 (*)    All other components within normal limits    ____________________________________________   ____________________________________________  RADIOLOGY  X-rays C-spine is negative  ____________________________________________   PROCEDURES  Procedure(s) performed: No  Procedures    ____________________________________________   INITIAL IMPRESSION / ASSESSMENT AND PLAN / ED COURSE  Pertinent labs & imaging results that were available during my care of the patient were reviewed by me and considered in my medical decision making (see chart for details).   Patient is a 47 year old female complaining of muscle spasms and pain of the neck radiate to the shoulder.  Physical exam shows tenderness along the shoulder and C-spine.  Labs for CBC and met B are normal.  X-rays C-spine is negative  Patient was given a prescription for Sterapred and baclofen.  She is to follow-up with Dr. Harlow Mares if not better in 5 to 7 days.  Or she may see her regular doctor and be referred to [facility] for the MRI he had suggested she get months ago.  She states she understands will comply.  She was discharged in stable condition.     As part of my medical decision making, I reviewed the following data within the [place] notes reviewed and incorporated, Labs reviewed CBC/met behavior basically normal, Old chart reviewed, Radiograph reviewed x-rays C-spine is negative, Notes from prior ED visits and [place] Controlled Substance Database  ____________________________________________   FINAL CLINICAL IMPRESSION(S) / ED DIAGNOSES  Final diagnoses:  Muscle spasm  Cervical radiculopathy      NEW MEDICATIONS STARTED DURING THIS VISIT:  Discharge Medication List as of 09/17/2018  3:39 PM    START taking these medications   Details  baclofen (LIORESAL) 10 MG tablet Take 1 tablet (10 mg total) by mouth 3 (three) times daily., Starting Sun 09/17/2018, Until Mon 09/17/2019, Normal    predniSONE (STERAPRED UNI-PAK 21 TAB) 10 MG  (21) TBPK tablet Take 6 pills on day one then decrease by 1 pill each day, Normal         Note:  This document was prepared using Dragon voice recognition software and may include unintentional dictation errors.    Versie Starks, PA-C 09/17/18 1617    Harvest Dark, MD 09/18/18 [phone]

## 2018-09-17 NOTE — ED Triage Notes (Signed)
Pt c/o "body pains". States pulled muscle and muscle spasms. States started in legs and states now in R arm. Also c/o of it in back. States works 2 jobs. Thought it was from over working. Symptoms since thanksgiving week.   A&O, ambulatory, no distress noted.

## 2018-09-17 NOTE — ED Notes (Signed)
See triage note. Pt resting in bed.

## 2019-01-16 IMAGING — CR DG CHEST 2V
1 series · 2 of 2 positions shown · non-contrast
Comparison: Chest radiograph August 16, 2017

CLINICAL DATA: Cough and shortness of breath for 1 month.

EXAM:
CHEST  2 VIEW

[Series 1: dg chest 2 view · 0.14mm/px · 2 of 2 slices shown]
[im 1/2]
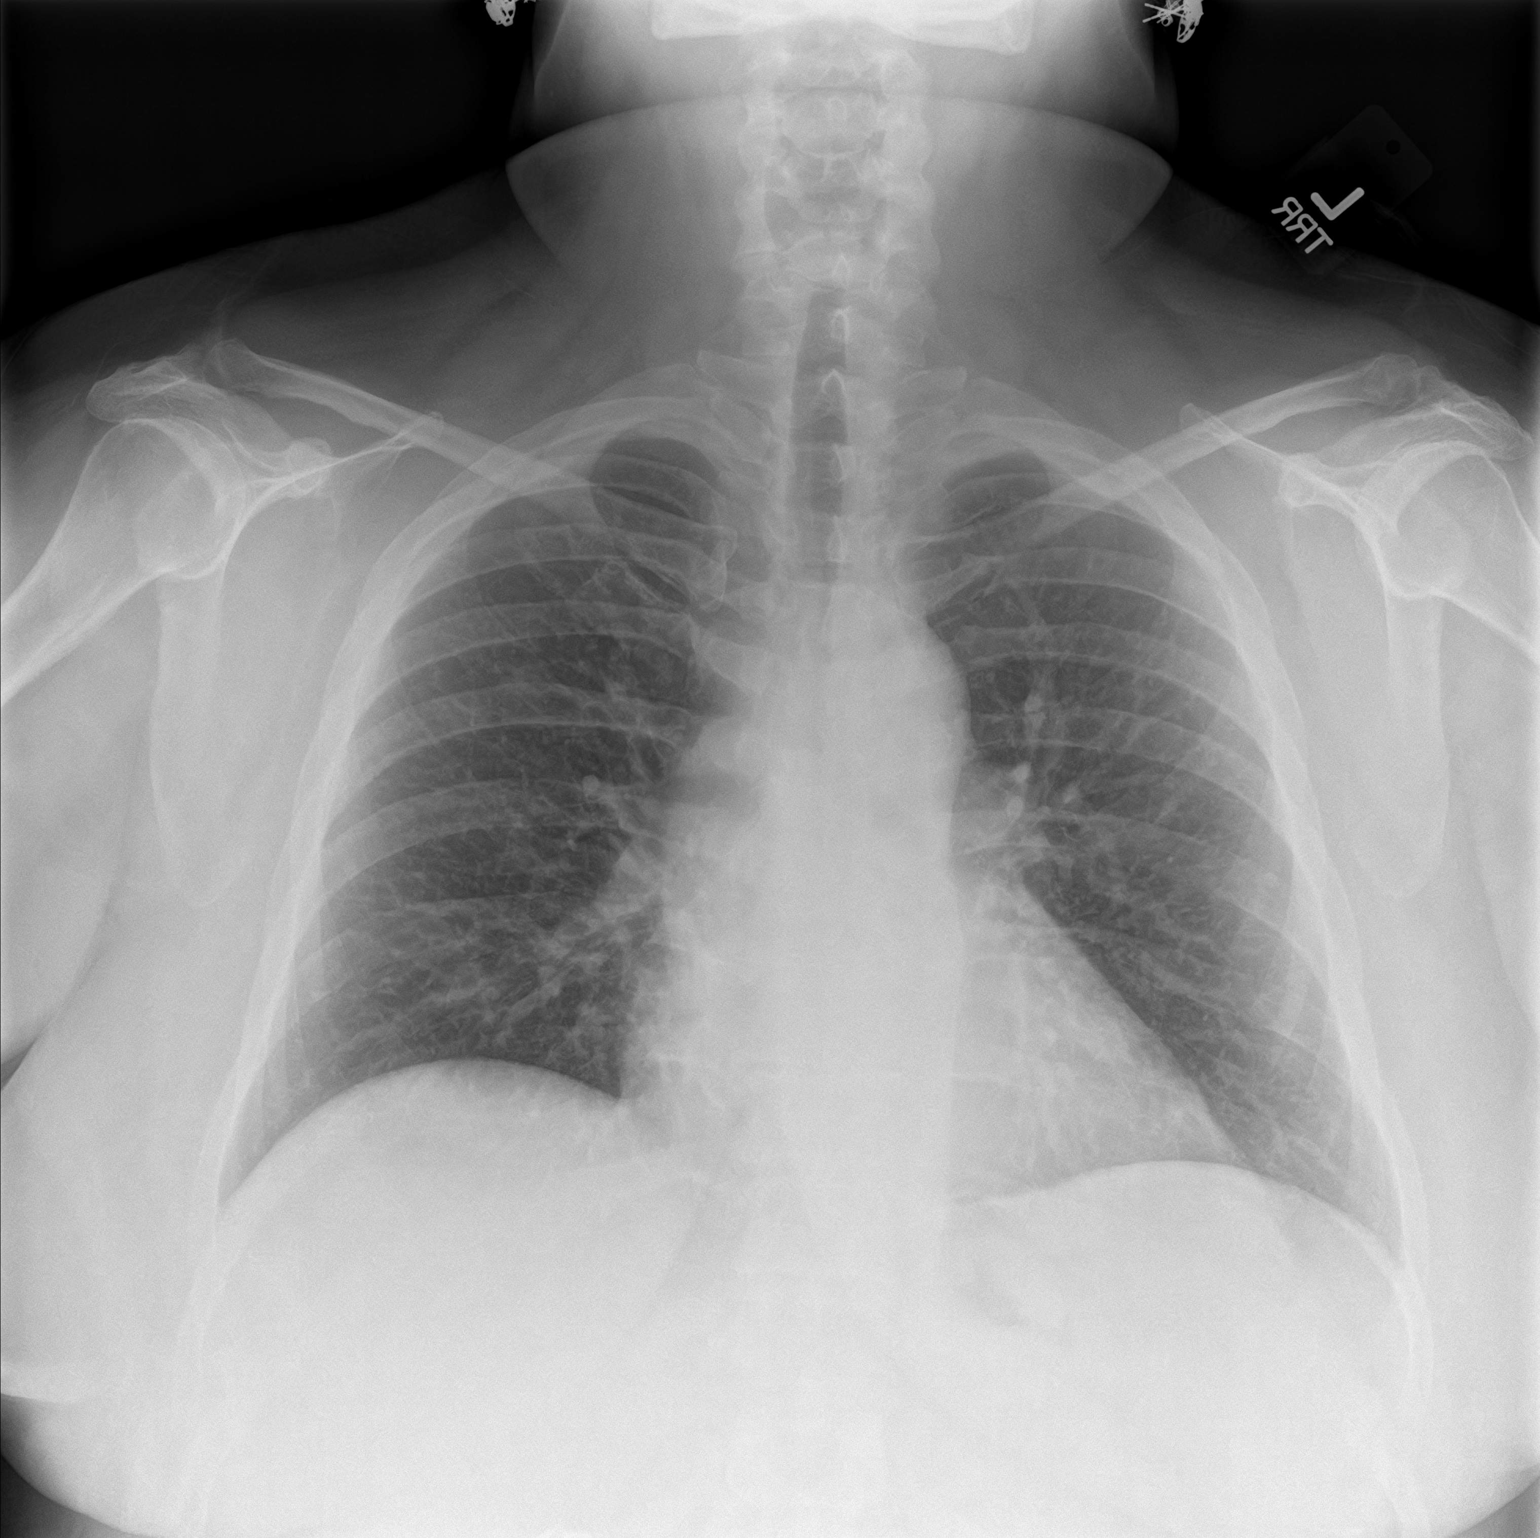
[im 2/2]
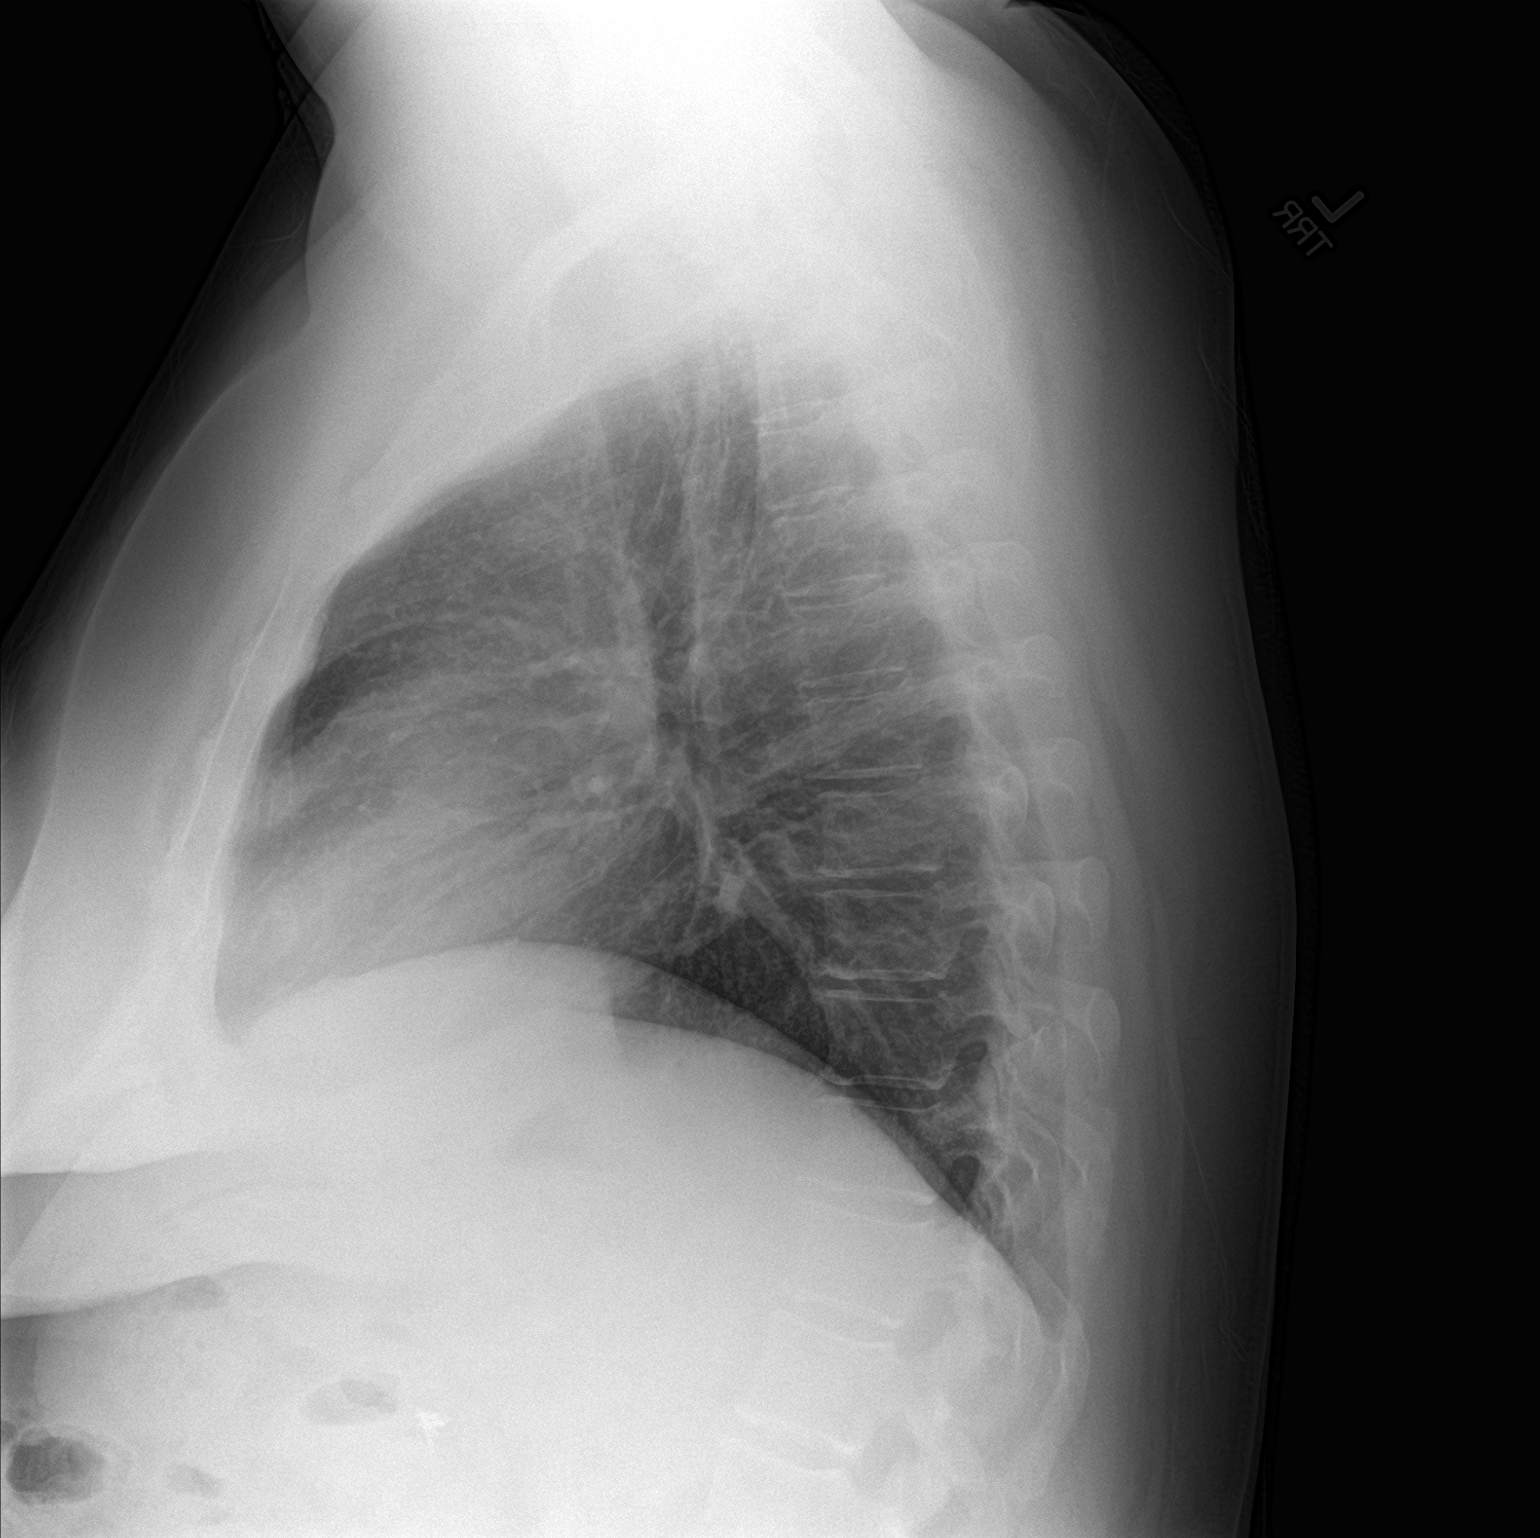

[2 of 2 positions shown; findings below may reference images not displayed]

FINDINGS: Cardiomediastinal silhouette is normal. No pleural effusions or
focal consolidations. Mild bronchitic changes. Low inspiratory
examination. Trachea projects midline and there is no pneumothorax.
Soft tissue planes and included osseous structures are
non-suspicious. Surgical clips in the included right abdomen
compatible with cholecystectomy.
IMPRESSION: Mild bronchitic changes without focal consolidation.

## 2020-01-17 IMAGING — CR DG CERVICAL SPINE 2 OR 3 VIEWS
1 series · 4 of 4 positions shown · non-contrast
Comparison: None.

CLINICAL DATA: Posterior neck pain x 10 days

EXAM:
CERVICAL SPINE - 2-3 VIEW

[Series 1: dg cervical spine 2 or 3 views · 0.14mm/px · 4 of 4 slices shown]
[im 1/4]
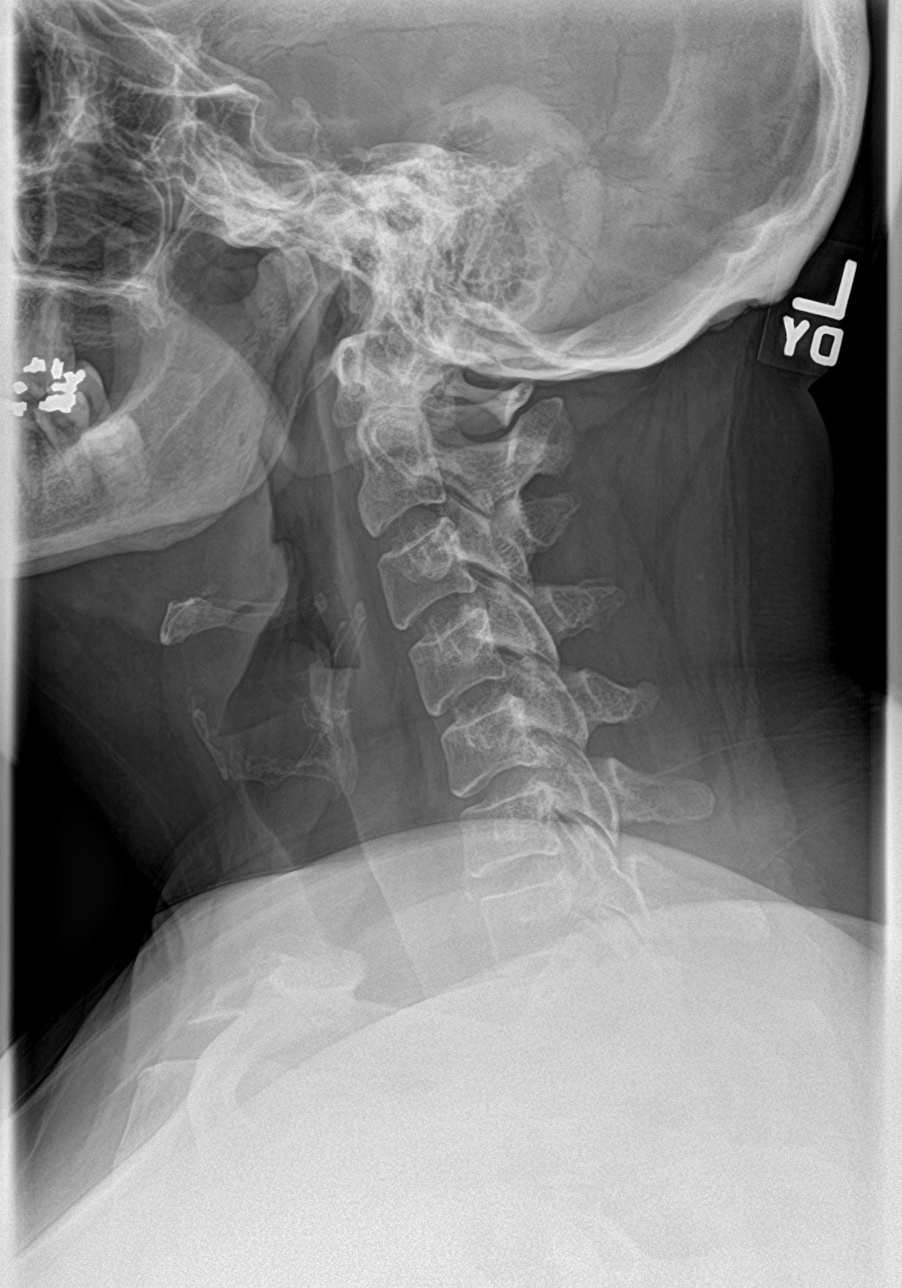
[im 2/4]
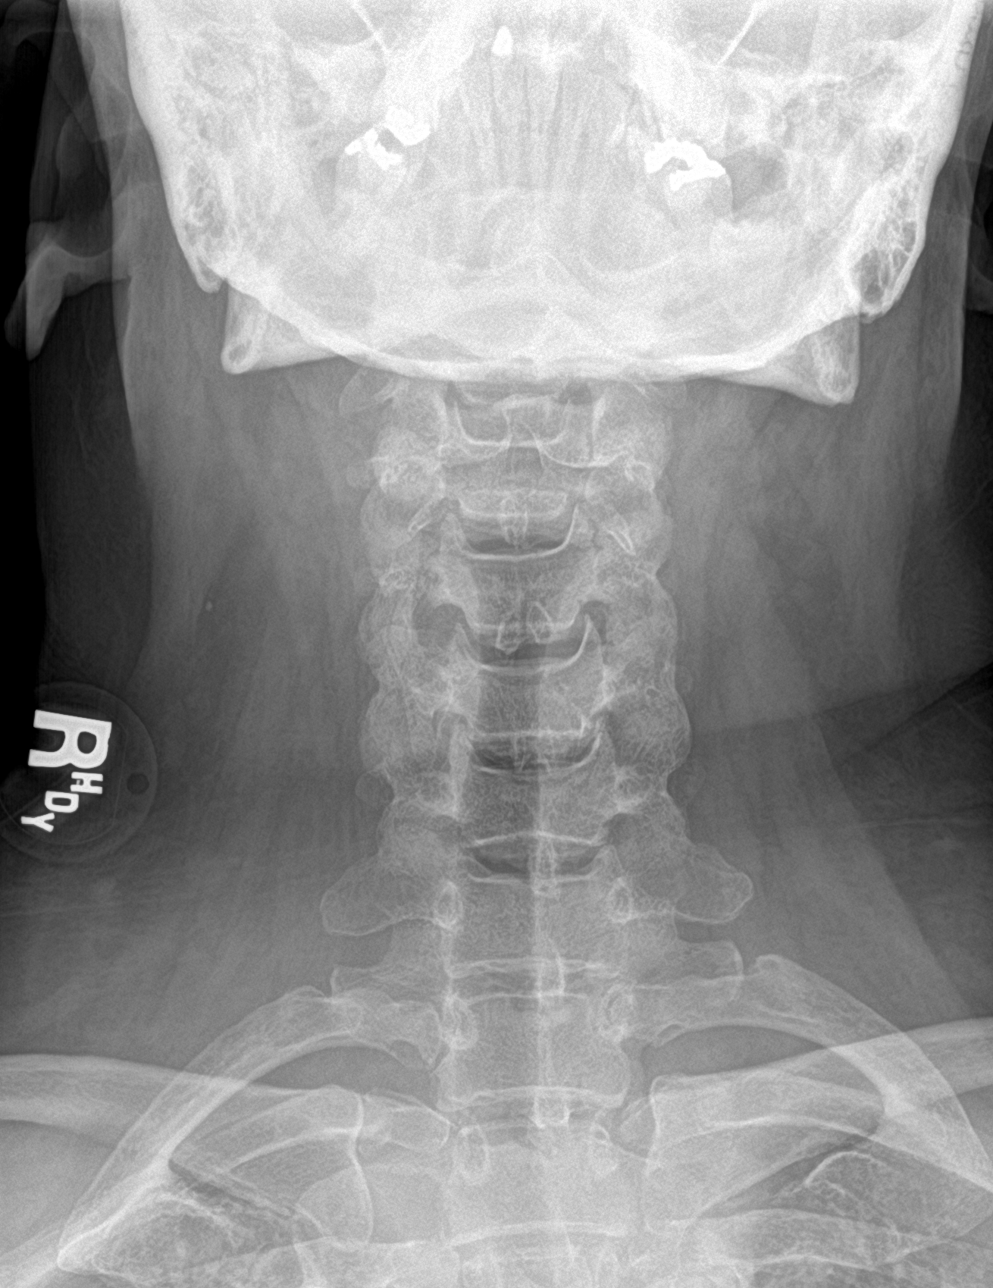
[im 3/4]
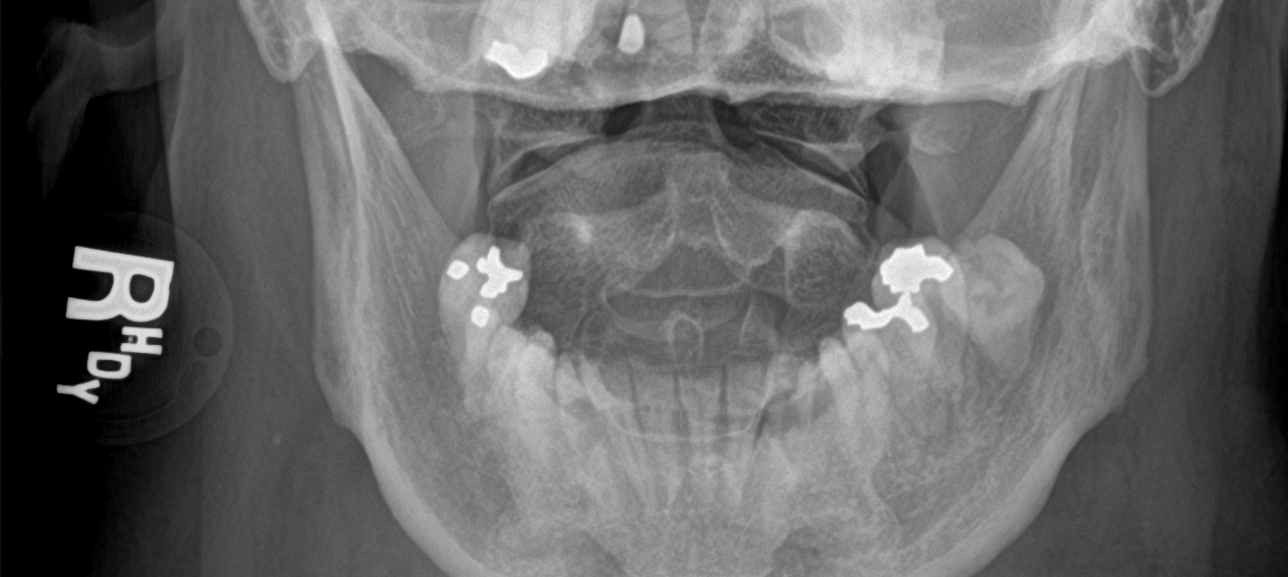
[im 4/4]
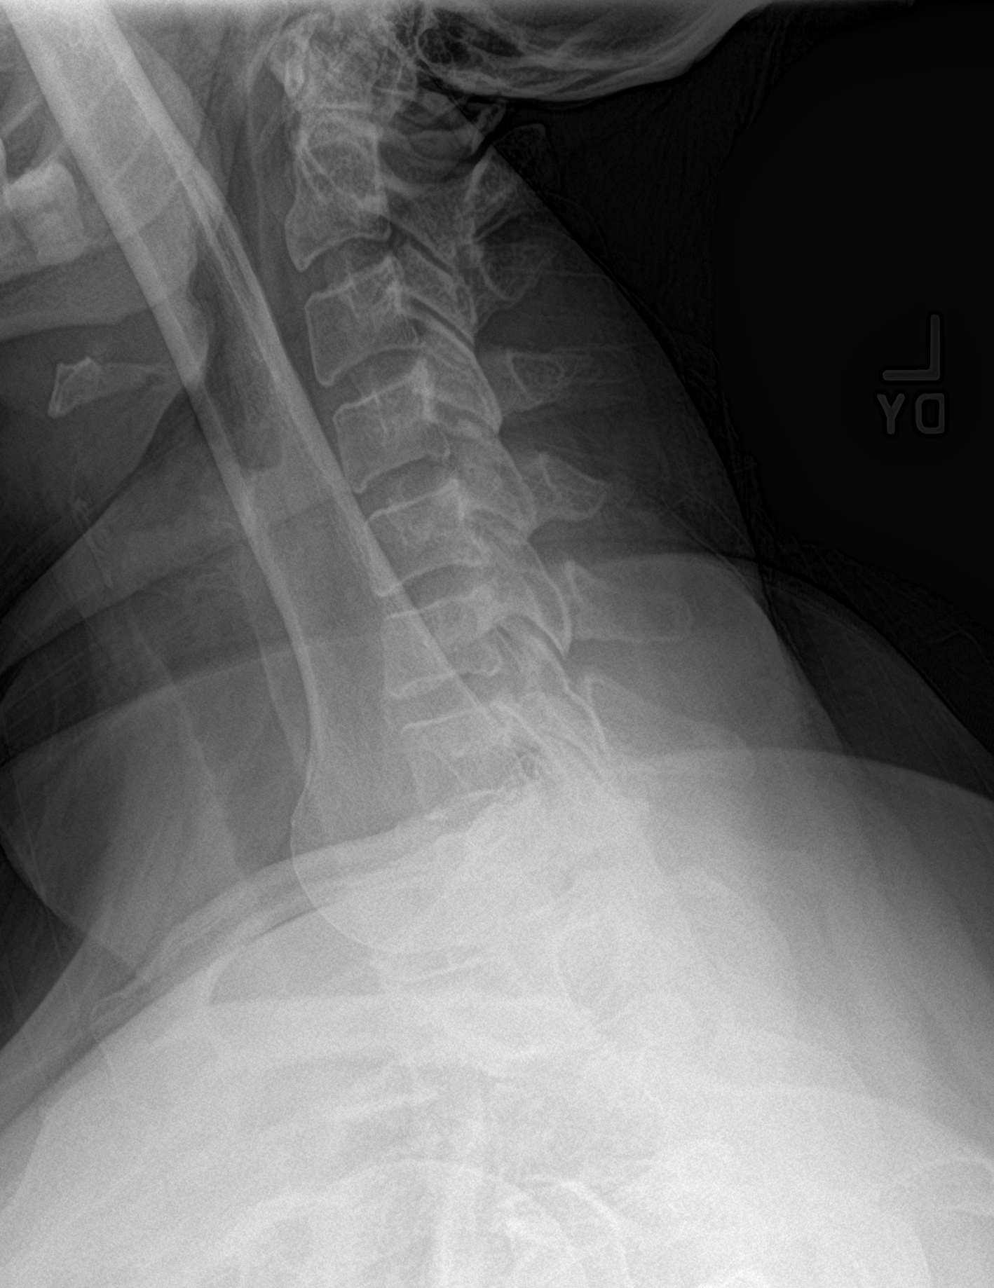

[4 of 4 positions shown; findings below may reference images not displayed]

FINDINGS: Mild straightening of cervical spine, likely positional.

No evidence of fracture or dislocation. Vertebral body heights and
intervertebral disc spaces are maintained. Dens appears intact.
Lateral masses C1 are symmetric.

No prevertebral soft tissue swelling.

Mild degenerative changes at C6-7.

Visualized lung apices are clear.
IMPRESSION: Negative cervical spine radiographs.

## 2020-05-27 ENCOUNTER — Other Ambulatory Visit: Payer: Self-pay

## 2020-05-27 ENCOUNTER — Emergency Department: Payer: HRSA Program

## 2020-05-27 ENCOUNTER — Inpatient Hospital Stay
Admission: EM | Admit: 2020-05-27 | Discharge: 2020-05-31 | DRG: 177 | Disposition: A | Discharge status: Home / Self Care | Payer: HRSA Program | Attending: Family Medicine | Admitting: Family Medicine

## 2020-05-27 DIAGNOSIS — E876 Hypokalemia: Secondary | ICD-10-CM | POA: Diagnosis present

## 2020-05-27 DIAGNOSIS — R197 Diarrhea, unspecified: Secondary | ICD-10-CM | POA: Diagnosis present

## 2020-05-27 DIAGNOSIS — J1282 Pneumonia due to coronavirus disease 2019: Secondary | ICD-10-CM | POA: Diagnosis present

## 2020-05-27 DIAGNOSIS — U071 COVID-19: Secondary | ICD-10-CM | POA: Diagnosis present

## 2020-05-27 DIAGNOSIS — Z6841 Body Mass Index (BMI) 40.0 and over, adult: Secondary | ICD-10-CM | POA: Diagnosis not present

## 2020-05-27 DIAGNOSIS — E119 Type 2 diabetes mellitus without complications: Secondary | ICD-10-CM | POA: Diagnosis present

## 2020-05-27 DIAGNOSIS — E8809 Other disorders of plasma-protein metabolism, not elsewhere classified: Secondary | ICD-10-CM | POA: Diagnosis present

## 2020-05-27 DIAGNOSIS — R112 Nausea with vomiting, unspecified: Secondary | ICD-10-CM | POA: Diagnosis present

## 2020-05-27 DIAGNOSIS — R0902 Hypoxemia: Secondary | ICD-10-CM

## 2020-05-27 DIAGNOSIS — J9601 Acute respiratory failure with hypoxia: Secondary | ICD-10-CM | POA: Diagnosis present

## 2020-05-27 DIAGNOSIS — L02213 Cutaneous abscess of chest wall: Secondary | ICD-10-CM | POA: Diagnosis present

## 2020-05-27 DIAGNOSIS — Z79899 Other long term (current) drug therapy: Secondary | ICD-10-CM | POA: Diagnosis not present

## 2020-05-27 DIAGNOSIS — N611 Abscess of the breast and nipple: Secondary | ICD-10-CM | POA: Diagnosis present

## 2020-05-27 LAB — ABO/RH: ABO/RH(D): O POS

## 2020-05-27 LAB — CBC
HCT: 38.5 % (ref 36.0–46.0)
Hemoglobin: 12.3 g/dL (ref 12.0–15.0)
MCH: 26 pg (ref 26.0–34.0)
MCHC: 31.9 g/dL (ref 30.0–36.0)
MCV: 81.4 fL (ref 80.0–100.0)
Platelets: 211 10*3/uL (ref 150–400)
RBC: 4.73 MIL/uL (ref 3.87–5.11)
RDW: 15.7 % — ABNORMAL HIGH (ref 11.5–15.5)
WBC: 5.5 10*3/uL (ref 4.0–10.5)
nRBC: 0 % (ref 0.0–0.2)

## 2020-05-27 LAB — SARS CORONAVIRUS 2 BY RT PCR (HOSPITAL ORDER, PERFORMED IN ~~LOC~~ HOSPITAL LAB): SARS Coronavirus 2: POSITIVE — AB

## 2020-05-27 LAB — BRAIN NATRIURETIC PEPTIDE: B Natriuretic Peptide: 17.5 pg/mL (ref 0.0–100.0)

## 2020-05-27 LAB — FIBRINOGEN: Fibrinogen: 528 mg/dL — ABNORMAL HIGH (ref 210–475)

## 2020-05-27 MED ORDER — DEXAMETHASONE SODIUM PHOSPHATE 10 MG/ML IJ SOLN
6.0000 mg | INTRAMUSCULAR | Status: DC
  Administered 2020-05-27 – 2020-05-30 (×3): 6 mg via INTRAVENOUS
  Filled 2020-05-27 (×4): qty 1

## 2020-05-27 MED ORDER — BARICITINIB 2 MG PO TABS
4.0000 mg | ORAL_TABLET | Freq: Every day | ORAL | Status: DC
  Administered 2020-05-28 – 2020-05-31 (×4): 4 mg via ORAL
  Filled 2020-05-27 (×4): qty 2

## 2020-05-27 MED ORDER — MAGNESIUM HYDROXIDE 400 MG/5ML PO SUSP
30.0000 mL | Freq: Every day | ORAL | Status: DC | PRN
  Filled 2020-05-27: qty 30

## 2020-05-27 MED ORDER — SODIUM CHLORIDE 0.9 % IV SOLN
200.0000 mg | Freq: Once | INTRAVENOUS | Status: AC
  Administered 2020-05-28: 200 mg via INTRAVENOUS
  Filled 2020-05-27: qty 200

## 2020-05-27 MED ORDER — TRAZODONE HCL 50 MG PO TABS
25.0000 mg | ORAL_TABLET | Freq: Every evening | ORAL | Status: DC | PRN
  Filled 2020-05-27: qty 1

## 2020-05-27 MED ORDER — FAMOTIDINE 20 MG PO TABS
20.0000 mg | ORAL_TABLET | Freq: Two times a day (BID) | ORAL | Status: DC
  Administered 2020-05-27 – 2020-05-31 (×8): 20 mg via ORAL
  Filled 2020-05-27 (×8): qty 1

## 2020-05-27 MED ORDER — HYDROCOD POLST-CPM POLST ER 10-8 MG/5ML PO SUER: 5.0000 mL | Freq: Two times a day (BID) | ORAL | Status: DC | PRN

## 2020-05-27 MED ORDER — GUAIFENESIN ER 600 MG PO TB12
600.0000 mg | ORAL_TABLET | Freq: Two times a day (BID) | ORAL | Status: DC
  Administered 2020-05-27 – 2020-05-31 (×7): 600 mg via ORAL
  Filled 2020-05-27 (×9): qty 1

## 2020-05-27 MED ORDER — ZINC SULFATE 220 (50 ZN) MG PO CAPS
220.0000 mg | ORAL_CAPSULE | Freq: Every day | ORAL | Status: DC
  Administered 2020-05-27 – 2020-05-31 (×5): 220 mg via ORAL
  Filled 2020-05-27 (×5): qty 1

## 2020-05-27 MED ORDER — VITAMIN D 25 MCG (1000 UNIT) PO TABS
1000.0000 [IU] | ORAL_TABLET | Freq: Every day | ORAL | Status: DC
  Administered 2020-05-27 – 2020-05-31 (×5): 1000 [IU] via ORAL
  Filled 2020-05-27 (×5): qty 1

## 2020-05-27 MED ORDER — ONDANSETRON HCL 4 MG/2ML IJ SOLN
4.0000 mg | Freq: Once | INTRAMUSCULAR | Status: AC
  Administered 2020-05-27: 4 mg via INTRAVENOUS
  Filled 2020-05-27: qty 2

## 2020-05-27 MED ORDER — GUAIFENESIN-DM 100-10 MG/5ML PO SYRP
10.0000 mL | ORAL_SOLUTION | ORAL | Status: DC | PRN
  Administered 2020-05-28 – 2020-05-30 (×4): 10 mL via ORAL
  Filled 2020-05-27 (×5): qty 10

## 2020-05-27 MED ORDER — ONDANSETRON HCL 4 MG/2ML IJ SOLN: 4.0000 mg | Freq: Four times a day (QID) | INTRAMUSCULAR | Status: DC | PRN

## 2020-05-27 MED ORDER — ENOXAPARIN SODIUM 40 MG/0.4ML ~~LOC~~ SOLN: 40.0000 mg | SUBCUTANEOUS | Status: DC

## 2020-05-27 MED ORDER — ACETAMINOPHEN 325 MG PO TABS
650.0000 mg | ORAL_TABLET | Freq: Once | ORAL | Status: AC
  Administered 2020-05-27: 650 mg via ORAL
  Filled 2020-05-27: qty 2

## 2020-05-27 MED ORDER — SODIUM CHLORIDE 0.9 % IV SOLN
100.0000 mg | Freq: Every day | INTRAVENOUS | Status: AC
  Administered 2020-05-28 – 2020-05-31 (×4): 100 mg via INTRAVENOUS
  Filled 2020-05-27 (×4): qty 20

## 2020-05-27 MED ORDER — SODIUM CHLORIDE 0.9 % IV SOLN: INTRAVENOUS | Status: DC

## 2020-05-27 MED ORDER — ONDANSETRON HCL 4 MG PO TABS: 4.0000 mg | ORAL_TABLET | Freq: Four times a day (QID) | ORAL | Status: DC | PRN

## 2020-05-27 MED ORDER — ACETAMINOPHEN 325 MG PO TABS
650.0000 mg | ORAL_TABLET | Freq: Four times a day (QID) | ORAL | Status: DC | PRN
  Administered 2020-05-29 – 2020-05-31 (×4): 650 mg via ORAL
  Filled 2020-05-27 (×4): qty 2

## 2020-05-27 MED ORDER — ASCORBIC ACID 500 MG PO TABS
500.0000 mg | ORAL_TABLET | Freq: Every day | ORAL | Status: DC
  Administered 2020-05-27 – 2020-05-31 (×5): 500 mg via ORAL
  Filled 2020-05-27 (×6): qty 1

## 2020-05-27 NOTE — ED Triage Notes (Signed)
See first nurse note. When asking pt the reason for her visit she states "I dont feel good". Pt reports vomiting, diarrhea and cough. Pt tested positive for covid on 05/18/20. Pt reports vomiting x 2 days. Pt speaking with this RN in complete sentences without shob, no cough at this time, skin warm and dry, NAD.

## 2020-05-27 NOTE — ED Provider Notes (Signed)
ED ECG REPORT I, Blake Divine, the attending physician, personally viewed and interpreted this ECG.   Date: 05/27/2020  EKG Time: 21:21  Rate: 92  Rhythm: normal sinus rhythm  Axis: Normal  Intervals:none  ST&T Change: None    Blake Divine, MD 05/27/20 2134

## 2020-05-27 NOTE — ED Provider Notes (Signed)
[facility] Emergency Department Provider Note  ____________________________________________  Time seen: Approximately 6:32 PM  I have reviewed the triage vital signs and the nursing notes.   HISTORY  Chief Complaint Emesis and Diarrhea    HPI Lisa Howell is a 49 y.o. female that is Covid positive 8 days ago that presents to the emergency department for evaluation of nausea, vomiting, diarrhea with for 3 days. Patient states that she has been eating jello and nausea for 3 days because other food makes her nausous and vomits.  She has had 3 episodes of vomiting and 3 episodes of diarrhea today.  She has a non productive cough but it is "not bad." She thought she was supposed to be feeling better by now. She thought she had a fever when she arrived but it was normal. No SOB, CP, abdominal pain.   History reviewed. No pertinent past medical history.  Patient Active Problem List   Diagnosis Date Noted  . Pneumonia due to 2019-nCoV 05/27/2020    Past Surgical History:  Procedure Laterality Date  . ABDOMINAL HYSTERECTOMY    . CESAREAN SECTION    . CHOLECYSTECTOMY      Prior to Admission medications   Medication Sig Start Date End Date Taking? Authorizing Provider  albuterol (PROVENTIL HFA;VENTOLIN HFA) 108 (90 Base) MCG/ACT inhaler Inhale 2 puffs into the lungs every 6 (six) hours as needed for wheezing or shortness of breath. 09/16/17   Eula Listen, MD  predniSONE (STERAPRED UNI-PAK 21 TAB) 10 MG (21) TBPK tablet Take 6 pills on day one then decrease by 1 pill each day 09/17/18   Versie Starks, PA-C    Allergies Patient has no known allergies.  History reviewed. No pertinent family history.  Social History Social History   Tobacco Use  . Smoking status: Never Smoker  . Smokeless tobacco: Never Used  Substance Use Topics  . Alcohol use: No  . Drug use: No     Review of Systems  Constitutional: No chills ENT: No upper  respiratory complaints. Cardiovascular: No chest pain. Respiratory: Positive for dry cough. No SOB. Gastrointestinal: No abdominal pain.  Positive for nausea and vomiting. Positive for diarrhea. Musculoskeletal: Negative for musculoskeletal pain. Skin: Negative for rash, abrasions, lacerations, ecchymosis. Neurological: Negative for numbness or tingling. Positive for headache.   ____________________________________________   PHYSICAL EXAM:  VITAL SIGNS: ED Triage Vitals [05/27/20 1615]  Enc Vitals Group     BP (!) 117/93     Pulse Rate 89     Resp 18     Temp 98.7 F (37.1 C)     Temp Source Oral     SpO2 98 %     Weight 230 lb (104.3 kg)     Height 5\' 1"  (1.549 m)     Head Circumference      Peak Flow      Pain Score 0     Pain Loc      Pain Edu?      Excl. in [place]?      Constitutional: Alert and oriented. Well appearing and in no acute distress. Eyes: Conjunctivae are normal. PERRL. EOMI. Head: Atraumatic. ENT:      Ears:      Nose: No congestion/rhinnorhea.      Mouth/Throat: Mucous membranes are moist.  Neck: No stridor. Cardiovascular: Normal rate, regular rhythm.  Good peripheral circulation. Respiratory: Normal respiratory effort without tachypnea or retractions. Lungs CTAB. Good air entry to the bases with no decreased  or absent breath sounds. Gastrointestinal: Bowel sounds 4 quadrants. Soft and nontender to palpation. No guarding or rigidity. No palpable masses. No distention. Musculoskeletal: Full range of motion to all extremities. No gross deformities appreciated. Neurologic:  Normal speech and language. No gross focal neurologic deficits are appreciated.  Skin:  Skin is warm, dry and intact. No rash noted. Psychiatric: Mood and affect are normal. Speech and behavior are normal. Patient exhibits appropriate insight and judgement.   ____________________________________________   LABS (all labs ordered are listed, but only abnormal results are  displayed)  Labs Reviewed  SARS CORONAVIRUS 2 BY RT PCR ([facility]) - Abnormal; Notable for the following components:      Result Value   SARS Coronavirus 2 POSITIVE (*)    All other components within normal limits  CBC - Abnormal; Notable for the following components:   RDW 15.7 (*)    All other components within normal limits  COMPREHENSIVE METABOLIC PANEL  FIBRIN DERIVATIVES D-DIMER ([facility] ONLY)  HIV ANTIBODY (ROUTINE TESTING W REFLEX)  BRAIN NATRIURETIC PEPTIDE  C-REACTIVE PROTEIN  FIBRIN DERIVATIVES D-DIMER ([facility] ONLY)  FERRITIN  FIBRINOGEN  LACTATE DEHYDROGENASE  PROCALCITONIN  CBC WITH DIFFERENTIAL/PLATELET  COMPREHENSIVE METABOLIC PANEL  C-REACTIVE PROTEIN  FIBRIN DERIVATIVES D-DIMER ([facility] ONLY)  FERRITIN  ABO/RH  TROPONIN I (HIGH SENSITIVITY)  TROPONIN I (HIGH SENSITIVITY)  TROPONIN I (HIGH SENSITIVITY)   ____________________________________________  EKG   ____________________________________________  RADIOLOGY Robinette Haines, personally viewed and evaluated these images (plain radiographs) as part of my medical decision making, as well as reviewing the written report by the radiologist.  DG Chest 1 View  Result Date: 05/27/2020 CLINICAL DATA:  Vomiting, diarrhea, COVID-19 positive 05/18/2020 EXAM: CHEST  1 VIEW COMPARISON:  09/16/2017 FINDINGS: 2 frontal views of the chest demonstrate an unremarkable cardiac silhouette. There is patchy multifocal bilateral airspace disease greatest in the left mid and lower lung zones and right lung base. No effusion or pneumothorax. No acute bony abnormalities. IMPRESSION: 1. Multifocal bilateral pneumonia compatible with COVID 19. Electronically Signed   By: Randa Ngo M.D.   On: 05/27/2020 19:24    ____________________________________________    PROCEDURES  Procedure(s) performed:    Procedures    ____________________________________________   INITIAL IMPRESSION  / ASSESSMENT AND PLAN / ED COURSE  Pertinent labs & imaging results that were available during my care of the patient were reviewed by me and considered in my medical decision making (see chart for details).  Review of the [place] CSRS was performed in accordance of the [place] prior to dispensing any controlled drugs.   Patient's diagnosis is consistent with Covid pneumonia and hypoxia.  Chest x-ray concerning for multifocal pneumonia.  Patient's oxygen dropped to 83 and 84% in the emergency department.  Patient will be admitted for hypoxia with Covid pneumonia.  Lab work hemolyzed and there was difficulty obtaining an additional IV.  Lab work is in process at this time.  Patient will be admitted to the hospital and Dr. Sidney Ace is agreeable with admission.    Lisa Howell was evaluated in Emergency Department on 05/27/2020 for the symptoms described in the history of present illness. She was evaluated in the context of the global COVID-19 pandemic, which necessitated consideration that the patient might be at risk for infection with the SARS-CoV-2 virus that causes COVID-19. Institutional protocols and algorithms that pertain to the evaluation of patients at risk for COVID-19 are in a state of rapid change based on  information released by regulatory bodies including the CDC and federal and state organizations. These policies and algorithms were followed during the patient's care in the ED.  ____________________________________________  FINAL CLINICAL IMPRESSION(S) / ED DIAGNOSES  Final diagnoses:  None      NEW MEDICATIONS STARTED DURING THIS VISIT:  ED Discharge Orders    None          This chart was dictated using voice recognition software/Dragon. Despite best efforts to proofread, errors can occur which can change the meaning. Any change was purely unintentional.    Laban Emperor, PA-C 05/27/20 2256    Harvest Dark, MD 05/27/20 [phone]

## 2020-05-27 NOTE — H&P (Addendum)
[place]   PATIENT NAME: Lisa Howell    MR#:  [id number]  DATE OF BIRTH:  May 25, 1971  DATE OF ADMISSION:  05/27/2020  PRIMARY CARE PHYSICIAN: [facility] [place]   REQUESTING/REFERRING PHYSICIAN: Laban Emperor, PA-C CHIEF COMPLAINT:   Chief Complaint  Patient presents with  . Emesis  . Diarrhea    HISTORY OF PRESENT ILLNESS:  Lisa Howell  is a 49 y.o. obese Caucasian female with a known history of headaches, presented to the emergency room with onset of vomiting with 3 episodes of diarrhea today as well as associated cough which has been mainly dry with mild dyspnea since Thursday.  She admitted to nausea with mild loss of taste and smell.  She started having significant fatigue and tiredness on Sunday night and on Monday she felt bad per her report.  She has been exposed to TB to people with COVID-19 and she has not been vaccinated.  No chest pain or palpitations.  No fever or chills.  Upon position to the emergency room, temperature was 98.7 and later 100 with otherwise normal vital signs.  Pulse oximetry later on dropped to 85-89% on room air and she was placed on O2 at 2 L/min but with pulse oximetry up to 95%.  Labs revealed mild hypokalemia and mild hypoalbuminemia.  BNP was only 17.5.  LDH was 223 and ferritin 417.  CBC was unremarkable.  Her fibrinogen was 528 and D-dimer 769.95.  COVID-19 PCR came back positive.  Portable chest ray showed multifocal bilateral pneumonia compatible with COVID-19.  The patient was given p.o. Tylenol and Zofran.  She will be admitted to a medical monitored isolation bed for further evaluation and management. PAST MEDICAL HISTORY:  Headaches  PAST SURGICAL HISTORY:   Past Surgical History:  Procedure Laterality Date  . ABDOMINAL HYSTERECTOMY    . CESAREAN SECTION    . CHOLECYSTECTOMY      SOCIAL HISTORY:   Social History   Tobacco Use  . Smoking status: Never Smoker  . Smokeless tobacco: Never Used    Substance Use Topics  . Alcohol use: No    FAMILY HISTORY:  History reviewed. No pertinent family history.  DRUG ALLERGIES:  No Known Allergies  REVIEW OF SYSTEMS:   ROS As per history of present illness. All pertinent systems were reviewed above. Constitutional, HEENT, cardiovascular, respiratory, GI, GU, musculoskeletal, neuro, psychiatric, endocrine, integumentary and hematologic systems were reviewed and are otherwise negative/unremarkable except for positive findings mentioned above in the HPI.   MEDICATIONS AT HOME:   Prior to Admission medications   Medication Sig Start Date End Date Taking? Authorizing Provider  albuterol (PROVENTIL HFA;VENTOLIN HFA) 108 (90 Base) MCG/ACT inhaler Inhale 2 puffs into the lungs every 6 (six) hours as needed for wheezing or shortness of breath. 09/16/17   Eula Listen, MD  predniSONE (STERAPRED UNI-PAK 21 TAB) 10 MG (21) TBPK tablet Take 6 pills on day one then decrease by 1 pill each day 09/17/18   Versie Starks, PA-C      VITAL SIGNS:  Blood pressure 109/69, pulse 95, temperature 100 F (37.8 C), temperature source Oral, resp. rate 18, height 5\' 1"  (1.549 m), weight 104.3 kg, SpO2 95 %.  PHYSICAL EXAMINATION:  Physical Exam  GENERAL:  49 y.o.-year-old obese Caucasian female patient lying in the bed with conversational dyspnea. EYES: Pupils equal, round, reactive to light and accommodation. No scleral icterus. Extraocular muscles intact.  HEENT: Head atraumatic, normocephalic. Oropharynx and nasopharynx clear.  NECK:  Supple, no jugular venous distention. No thyroid enlargement, no tenderness.  LUNGS: Diminished bibasal breath sounds with bibasal crackles. CARDIOVASCULAR: Regular rate and rhythm, S1, S2 normal. No murmurs, rubs, or gallops.  ABDOMEN: Soft, nondistended, nontender. Bowel sounds present. No organomegaly or mass.  EXTREMITIES: No pedal edema, cyanosis, or clubbing.  NEUROLOGIC: Cranial nerves II through XII are  intact. Muscle strength 5/5 in all extremities. Sensation intact. Gait not checked.  PSYCHIATRIC: The patient is alert and oriented x 3.  Normal affect and good eye contact. SKIN: No obvious rash, lesion, or ulcer.   LABORATORY PANEL:   CBC Recent Labs  Lab 05/27/20 1929  WBC 5.5  HGB 12.3  HCT 38.5  PLT 211   ------------------------------------------------------------------------------------------------------------------  Chemistries  No results for input(s): NA, K, CL, CO2, GLUCOSE, BUN, CREATININE, CALCIUM, MG, AST, ALT, ALKPHOS, BILITOT in the last 168 hours.  Invalid input(s): GFRCGP ------------------------------------------------------------------------------------------------------------------  Cardiac Enzymes No results for input(s): TROPONINI in the last 168 hours. ------------------------------------------------------------------------------------------------------------------  RADIOLOGY:  DG Chest 1 View  Result Date: 05/27/2020 CLINICAL DATA:  Vomiting, diarrhea, COVID-19 positive 05/18/2020 EXAM: CHEST  1 VIEW COMPARISON:  09/16/2017 FINDINGS: 2 frontal views of the chest demonstrate an unremarkable cardiac silhouette. There is patchy multifocal bilateral airspace disease greatest in the left mid and lower lung zones and right lung base. No effusion or pneumothorax. No acute bony abnormalities. IMPRESSION: 1. Multifocal bilateral pneumonia compatible with COVID 19. Electronically Signed   By: Randa Ngo M.D.   On: 05/27/2020 19:24      IMPRESSION AND PLAN:   1.  Acute hypoxemic respiratory failure secondary to COVID-19. -The patient will be admitted to a medically monitored isolation bed. -O2 protocol will be followed to keep O2 saturation above 93.   2.  Multifocal pneumonia secondary to COVID-19. -The patient will be admitted to an isolation monitored bed with droplet and contact precautions. -Given multifocal pneumonia we will empirically place the  patient on IV Rocephin and Zithromax for possible bacterial superinfection only with elevated Procalcitonin. -The patient will be placed on scheduled Mucinex and as needed Tussionex. -We will avoid nebulization as much as we can, give bronchodilator MDI if needed, and with deterioration of oxygenation try to avoid BiPAP/CPAP if possible.    -Will obtain sputum Gram stain culture and sensitivity and follow blood cultures. -O2 protocol will be followed. -We will follow CRP, ferritin, LDH and D-dimer. -Will follow manual differential for ANC/ALC ratio as well as follow troponin I and daily CBC with manual differential and CMP. - Will place the patient on IV Remdesivir and IV steroid therapy with Decadron with elevated inflammatory markers. -I discussed Baricitinib with her and she agreed to proceed with it. -The patient will be placed on vitamin D3, vitamin C, zinc sulfate and p.o. Pepcid. -Actemra can be considered for CRP more than 7 with associated hypoxemia.  3.  Hypokalemia. -Potassium will be replaced and magnesium level will be checked.  4.  History of headaches. -She does not have any current exacerbation.  5.  DVT prophylaxis. -Subcutaneous Lovenox.     All the records are reviewed and case discussed with ED provider. The plan of care was discussed in details with the patient (and family). I answered all questions. The patient agreed to proceed with the above mentioned plan. Further management will depend upon hospital course.   CODE STATUS: Full code  Status is: Inpatient  Remains inpatient appropriate because:Ongoing diagnostic testing needed not appropriate for outpatient work up, Unsafe  d/c plan, IV treatments appropriate due to intensity of illness or inability to take PO and Inpatient level of care appropriate due to severity of illness   Dispo: The patient is from: Home              Anticipated d/c is to: Home              Anticipated d/c date is: 3 days               Patient currently is not medically stable to d/c.   TOTAL TIME TAKING CARE OF THIS PATIENT: 55 minutes.    Christel Mormon M.D on 05/27/2020 at 9:43 PM  [facility]   From 7 PM-7 AM, contact night-coverage [email or web address]  CC: Primary care physician; [employer]   Note: This dictation was prepared with Diplomatic Services operational officer dictation along with smaller Company secretary. Any transcriptional typo errors that result from this process are unintentional.

## 2020-05-27 NOTE — ED Triage Notes (Signed)
First nurse note- EMS states patient called  "because doesn't feel well".  covid +. VSS, NAD

## 2020-05-28 ENCOUNTER — Other Ambulatory Visit: Payer: Self-pay

## 2020-05-28 LAB — COMPREHENSIVE METABOLIC PANEL
ALT: 27 U/L (ref 0–44)
ALT: 28 U/L (ref 0–44)
AST: 48 U/L — ABNORMAL HIGH (ref 15–41)
AST: 40 U/L (ref 15–41)
Albumin: 3.4 g/dL — ABNORMAL LOW (ref 3.5–5.0)
Albumin: 3 g/dL — ABNORMAL LOW (ref 3.5–5.0)
Alkaline Phosphatase: 71 U/L (ref 38–126)
Alkaline Phosphatase: 69 U/L (ref 38–126)
Anion gap: 12 (ref 5–15)
Anion gap: 8 (ref 5–15)
BUN: 23 mg/dL — ABNORMAL HIGH (ref 6–20)
BUN: 24 mg/dL — ABNORMAL HIGH (ref 6–20)
CO2: 27 mmol/L (ref 22–32)
CO2: 27 mmol/L (ref 22–32)
Calcium: 8.5 mg/dL — ABNORMAL LOW (ref 8.9–10.3)
Calcium: 8 mg/dL — ABNORMAL LOW (ref 8.9–10.3)
Chloride: 97 mmol/L — ABNORMAL LOW (ref 98–111)
Chloride: 101 mmol/L (ref 98–111)
Creatinine, Ser: 1.18 mg/dL — ABNORMAL HIGH (ref 0.44–1.00)
Creatinine, Ser: 0.78 mg/dL (ref 0.44–1.00)
GFR calc Af Amer: >60 mL/min (ref >60)
GFR calc Af Amer: >60 mL/min (ref >60)
GFR calc non Af Amer: 53 mL/min — ABNORMAL LOW (ref >60)
GFR calc non Af Amer: >60 mL/min (ref >60)
Glucose, Bld: 140 mg/dL — ABNORMAL HIGH (ref 70–99)
Glucose, Bld: 243 mg/dL — ABNORMAL HIGH (ref 70–99)
Potassium: 3.4 mmol/L — ABNORMAL LOW (ref 3.5–5.1)
Potassium: 3.7 mmol/L (ref 3.5–5.1)
Sodium: 136 mmol/L (ref 135–145)
Sodium: 136 mmol/L (ref 135–145)
Total Bilirubin: 0.7 mg/dL (ref 0.3–1.2)
Total Bilirubin: 0.7 mg/dL (ref 0.3–1.2)
Total Protein: 7.4 g/dL (ref 6.5–8.1)
Total Protein: 7 g/dL (ref 6.5–8.1)

## 2020-05-28 LAB — GLUCOSE, CAPILLARY
Glucose-Capillary: 232 mg/dL — ABNORMAL HIGH (ref 70–99)
Glucose-Capillary: 236 mg/dL — ABNORMAL HIGH (ref 70–99)
Glucose-Capillary: 131 mg/dL — ABNORMAL HIGH (ref 70–99)
Glucose-Capillary: 174 mg/dL — ABNORMAL HIGH (ref 70–99)

## 2020-05-28 LAB — CBC WITH DIFFERENTIAL/PLATELET
Abs Immature Granulocytes: 0.01 10*3/uL (ref 0.00–0.07)
Basophils Absolute: 0 10*3/uL (ref 0.0–0.1)
Basophils Relative: 0 %
Eosinophils Absolute: 0 10*3/uL (ref 0.0–0.5)
Eosinophils Relative: 0 %
HCT: 35.3 % — ABNORMAL LOW (ref 36.0–46.0)
Hemoglobin: 11.4 g/dL — ABNORMAL LOW (ref 12.0–15.0)
Immature Granulocytes: 0 %
Lymphocytes Relative: 26 %
Lymphs Abs: 0.9 10*3/uL (ref 0.7–4.0)
MCH: 26 pg (ref 26.0–34.0)
MCHC: 32.3 g/dL (ref 30.0–36.0)
MCV: 80.4 fL (ref 80.0–100.0)
Monocytes Absolute: 0.1 10*3/uL (ref 0.1–1.0)
Monocytes Relative: 2 %
Neutro Abs: 2.7 10*3/uL (ref 1.7–7.7)
Neutrophils Relative %: 72 %
Platelets: 175 10*3/uL (ref 150–400)
RBC: 4.39 MIL/uL (ref 3.87–5.11)
RDW: 15.9 % — ABNORMAL HIGH (ref 11.5–15.5)
WBC: 3.7 10*3/uL — ABNORMAL LOW (ref 4.0–10.5)
nRBC: 0 % (ref 0.0–0.2)

## 2020-05-28 LAB — MAGNESIUM: Magnesium: 2 mg/dL (ref 1.7–2.4)

## 2020-05-28 LAB — LACTATE DEHYDROGENASE: LDH: 223 U/L — ABNORMAL HIGH (ref 98–192)

## 2020-05-28 LAB — TROPONIN I (HIGH SENSITIVITY): Troponin I (High Sensitivity): 7 ng/L (ref <18)

## 2020-05-28 LAB — HEMOGLOBIN A1C
Hgb A1c MFr Bld: 6.9 % — ABNORMAL HIGH (ref 4.8–5.6)
Mean Plasma Glucose: 151.33 mg/dL

## 2020-05-28 LAB — C-REACTIVE PROTEIN
CRP: 3.8 mg/dL — ABNORMAL HIGH (ref <1.0)
CRP: 3.4 mg/dL — ABNORMAL HIGH (ref <1.0)

## 2020-05-28 LAB — HIV ANTIBODY (ROUTINE TESTING W REFLEX): HIV Screen 4th Generation wRfx: NONREACTIVE

## 2020-05-28 LAB — FERRITIN
Ferritin: 417 ng/mL — ABNORMAL HIGH (ref 11–307)
Ferritin: 410 ng/mL — ABNORMAL HIGH (ref 11–307)

## 2020-05-28 LAB — PROCALCITONIN: Procalcitonin: <0.1 ng/mL

## 2020-05-28 LAB — FIBRIN DERIVATIVES D-DIMER (ARMC ONLY)
Fibrin derivatives D-dimer (ARMC): 769.95 ng/mL (FEU) — ABNORMAL HIGH (ref 0.00–499.00)
Fibrin derivatives D-dimer (ARMC): 702.58 ng/mL (FEU) — ABNORMAL HIGH (ref 0.00–499.00)

## 2020-05-28 MED ORDER — ENOXAPARIN SODIUM 40 MG/0.4ML ~~LOC~~ SOLN
40.0000 mg | Freq: Two times a day (BID) | SUBCUTANEOUS | Status: DC
  Administered 2020-05-28 – 2020-05-31 (×7): 40 mg via SUBCUTANEOUS
  Filled 2020-05-28 (×7): qty 0.4

## 2020-05-28 MED ORDER — AMOXICILLIN-POT CLAVULANATE 875-125 MG PO TABS
1.0000 | ORAL_TABLET | Freq: Two times a day (BID) | ORAL | Status: DC
  Administered 2020-05-28 – 2020-05-31 (×7): 1 via ORAL
  Filled 2020-05-28 (×7): qty 1

## 2020-05-28 MED ORDER — POTASSIUM CHLORIDE 20 MEQ PO PACK
40.0000 meq | PACK | Freq: Once | ORAL | Status: AC
  Administered 2020-05-28: 40 meq via ORAL
  Filled 2020-05-28: qty 2

## 2020-05-28 MED ORDER — INSULIN ASPART 100 UNIT/ML ~~LOC~~ SOLN
0.0000 [IU] | Freq: Three times a day (TID) | SUBCUTANEOUS | Status: DC
  Administered 2020-05-28: 12:00:00 3 [IU] via SUBCUTANEOUS
  Administered 2020-05-28 – 2020-05-29 (×2): 1 [IU] via SUBCUTANEOUS
  Administered 2020-05-29: 13:00:00 3 [IU] via SUBCUTANEOUS
  Administered 2020-05-29 – 2020-05-30 (×3): 2 [IU] via SUBCUTANEOUS
  Administered 2020-05-30: 14:00:00 5 [IU] via SUBCUTANEOUS
  Administered 2020-05-31: 3 [IU] via SUBCUTANEOUS
  Filled 2020-05-28 (×9): qty 1

## 2020-05-28 NOTE — ED Notes (Signed)
Admitting MD in room to assess patient.  Will continue to monitor.   

## 2020-05-28 NOTE — Progress Notes (Signed)
Inpatient Diabetes Program Recommendations  AACE/ADA: New Consensus Statement on Inpatient Glycemic Control (2015)  Target Ranges:  Prepandial:   less than 140 mg/dL      Peak postprandial:   less than 180 mg/dL (1-2 hours)      Critically ill patients:  140 - 180 mg/dL   Lab Results  Component Value Date   GLUCAP 236 (H) 05/28/2020   HGBA1C 6.9 (H) 05/28/2020    Review of Glycemic Control Results for TAMRA, KOOS (MRN [id number]) as of 05/28/2020 13:44  Ref. Range 05/28/2020 12:16 05/28/2020 12:17  Glucose-Capillary Latest Ref Range: 70 - 99 mg/dL 232 (H) 236 (H)   Diabetes history: None Outpatient Diabetes medications:  None Current orders for Inpatient glycemic control:  Decadron 6 mg daily Novolog sensitive tid with meals Inpatient Diabetes Program Recommendations:   Note that A1C=6.9% (Is this new diagnosis of DM?).  May consider increasing Novolog correction to moderate and add Tradjenta 5 mg daily (COVID Glycemic control order set).   Thanks  Adah Perl, RN, BC-ADM Inpatient Diabetes Coordinator Pager [phone] (8a-5p)

## 2020-05-28 NOTE — Progress Notes (Signed)
PHARMACIST - PHYSICIAN COMMUNICATION  CONCERNING:  Enoxaparin (Lovenox) for DVT Prophylaxis    RECOMMENDATION: Patient was prescribed enoxaparin 40mg  q24 hours for VTE prophylaxis.   Filed Weights   05/27/20 1615  Weight: 104.3 kg (230 lb)    Body mass index is 43.46 kg/m.  Estimated Creatinine Clearance: 92.5 mL/min (by C-G formula based on SCr of 0.78 mg/dL).   Based on [place] patient is candidate for enoxaparin 40mg  every 12 hour dosing due to BMI being >40.  DESCRIPTION: Pharmacy has adjusted enoxaparin dose per [facility]/[place] policy.  Patient is now receiving enoxaparin 40mg  every 12 hours.   Benita Gutter 05/28/2020 7:51 AM

## 2020-05-28 NOTE — Progress Notes (Signed)
Remdesivir - Pharmacy Brief Note    A/P:  Remdesivir 200 mg IVPB once followed by 100 mg IVPB daily x 4 days.   Hart Robinsons, PharmD Clinical Pharmacist   05/28/2020 2:19 AM

## 2020-05-28 NOTE — Progress Notes (Signed)
[facility]  PROGRESS NOTE  TYRONZA HAPPE LXB:[id number] DOB: [birth year] DOA: 05/27/2020 PCP: [facility]   Brief HPI:   49 year old female with known history of headaches, came to ED with onset of vomiting with 3 episodes of diarrhea.  Patient was found to have COVID-19 infection.  Chest x-ray showed multifocal pneumonia.  Inflammatory markers were elevated.    Subjective   Patient seen and examined, complains of discharge from right chest wall.   Assessment/Plan:     1. Acute hypoxemic respiratory failure-chest x-ray shows multifocal pneumonia, patient started on remdesivir, Decadron, baricitinib.  CRP is 3.4.  Follow inflammatory markers in a.m. 2. Anterior chest wall abscess-drained in the shower as per patient.  Patient was examined with RN at bedside.  Will start Augmentin 1 tablet p.o. twice daily for 5 days. 3. Hypokalemia-potassium was repleted.     COVID-19 Labs  Recent Labs    05/27/20 2219 05/28/20 0415  FERRITIN 417* 410*  LDH 223*  --   CRP 3.8* 3.4*    Lab Results  Component Value Date   SARSCOV2NAA POSITIVE (A) 05/27/2020     Scheduled medications:   . amoxicillin-clavulanate  1 tablet Oral Q12H  . vitamin C  500 mg Oral Daily  . baricitinib  4 mg Oral Daily  . cholecalciferol  1,000 Units Oral Daily  . dexamethasone (DECADRON) injection  6 mg Intravenous Q24H  . enoxaparin (LOVENOX) injection  40 mg Subcutaneous Q12H  . famotidine  20 mg Oral BID  . guaiFENesin  600 mg Oral BID  . insulin aspart  0-9 Units Subcutaneous TID WC  . zinc sulfate  220 mg Oral Daily         CBG: Recent Labs  Lab 05/28/20 1216 05/28/20 1217 05/28/20 1700  GLUCAP 232* 236* 131*    SpO2: 92 % O2 Flow Rate (L/min): 2 L/min    CBC: Recent Labs  Lab 05/27/20 1929 05/28/20 0415  WBC 5.5 3.7*  NEUTROABS  --  2.7  HGB 12.3 11.4*  HCT 38.5 35.3*  MCV 81.4 80.4  PLT 211 416    Basic Metabolic Panel: Recent Labs  Lab  05/27/20 2219 05/28/20 0415  NA 136 136  K 3.4* 3.7  CL 97* 101  CO2 27 27  GLUCOSE 140* 243*  BUN 23* 24*  CREATININE 1.18* 0.78  CALCIUM 8.5* 8.0*  MG  --  2.0     Liver Function Tests: Recent Labs  Lab 05/27/20 2219 05/28/20 0415  AST 48* 40  ALT 27 28  ALKPHOS 71 69  BILITOT 0.7 0.7  PROT 7.4 7.0  ALBUMIN 3.4* 3.0*     Antibiotics: Anti-infectives (From admission, onward)   Start     Dose/Rate Route Frequency Ordered Stop   05/28/20 1400  amoxicillin-clavulanate (AUGMENTIN) 875-125 MG per tablet 1 tablet     Discontinue     1 tablet Oral Every 12 hours 05/28/20 1353     05/28/20 1000  remdesivir 100 mg in sodium chloride 0.9 % 100 mL IVPB     Discontinue    "Followed by" Linked Group Details   100 mg 200 mL/hr over 30 Minutes Intravenous Daily 05/27/20 2149 06/01/20 0959   05/28/20 0000  remdesivir 200 mg in sodium chloride 0.9% 250 mL IVPB       "Followed by" Linked Group Details   200 mg 580 mL/hr over 30 Minutes Intravenous Once 05/27/20 2149 05/28/20 0119       DVT prophylaxis: Lovenox  Code Status: Full code  Family Communication: No family at bedside    Status is: Inpatient  Dispo: The patient is from: Home              Anticipated d/c is to: Home              Anticipated d/c date is: 05/30/2020              Patient currently not medically stable for discharge  Barrier to discharge-COVID-19 pneumonia       Consultants:    Procedures:     Objective   Vitals:   05/28/20 0458 05/28/20 0832 05/28/20 0943 05/28/20 1217  BP: 106/68 107/86  (!) 112/94  Pulse: 69 75 74 72  Resp: 17 20 20 20   Temp:    98.5 F (36.9 C)  TempSrc:      SpO2: 97% 98% 96% 92%  Weight:      Height:        Intake/Output Summary (Last 24 hours) at 05/28/2020 1955 Last data filed at 05/28/2020 1529 Gross per 24 hour  Intake 580 ml  Output --  Net 580 ml    08/17 0701 - 08/18 1900 In: 580 [P.O.:480] Out: -   Filed Weights   05/27/20 1615   Weight: 104.3 kg    Physical Examination:    General: Appears in no acute distress  Cardiovascular: S1-S2, regular  Respiratory: Clear to auscultation bilaterally  Abdomen: Abdomen is soft, nontender, no organomegaly  Extremities: No edema in the lower extremities  Neurologic: Alert, oriented x3, intact insight and judgment  Skin-small erythematous lesion noted on the anterior chest wall, minimal discharge noted.    Data Reviewed:   Recent Results (from the past 240 hour(s))  SARS Coronavirus 2 by RT PCR (hospital order, performed in [facility] hospital lab) Nasopharyngeal Nasopharyngeal Swab     Status: Abnormal   Collection Time: 05/27/20  9:20 PM   Specimen: Nasopharyngeal Swab  Result Value Ref Range Status   SARS Coronavirus 2 POSITIVE (A) NEGATIVE Final    Comment: RESULT CALLED TO, READ BACK BY AND VERIFIED WITH: NOAH GRIFFITH 05/27/20 AT 2221 BY ACR (NOTE) SARS-CoV-2 target nucleic acids are DETECTED  SARS-CoV-2 RNA is generally detectable in upper respiratory specimens  during the acute phase of infection.  Positive results are indicative  of the presence of the identified virus, but do not rule out bacterial infection or co-infection with other pathogens not detected by the test.  Clinical correlation with patient history and  other diagnostic information is necessary to determine patient infection status.  The expected result is negative.  Fact Sheet for Patients:   StrictlyIdeas.no   Fact Sheet for Healthcare Providers:   BankingDealers.co.za    This test is not yet approved or cleared by the [place] FDA and  has been authorized for detection and/or diagnosis of SARS-CoV-2 by FDA under an Emergency Use Authorization (EUA).  This EUA will remain in effect (meaning this  test can be used) for the duration of  the COVID-19 declaration under Section 564(b)(1) of the Act, 21 U.S.C. section  360-bbb-3(b)(1), unless the authorization is terminated or revoked sooner.  Performed at [facility], [address] [place], [place] [address]     No results for input(s): LIPASE, AMYLASE in the last 168 hours. No results for input(s): AMMONIA in the last 168 hours.  Cardiac Enzymes: No results for input(s): CKTOTAL, CKMB, CKMBINDEX, TROPONINI in the last 168 hours. BNP (last  3 results) Recent Labs    05/27/20 2219  BNP 17.5    ProBNP (last 3 results) No results for input(s): PROBNP in the last 8760 hours.  Studies:  DG Chest 1 View  Result Date: 05/27/2020 CLINICAL DATA:  Vomiting, diarrhea, COVID-19 positive 05/18/2020 EXAM: CHEST  1 VIEW COMPARISON:  09/16/2017 FINDINGS: 2 frontal views of the chest demonstrate an unremarkable cardiac silhouette. There is patchy multifocal bilateral airspace disease greatest in the left mid and lower lung zones and right lung base. No effusion or pneumothorax. No acute bony abnormalities. IMPRESSION: 1. Multifocal bilateral pneumonia compatible with COVID 19. Electronically Signed   By: Randa Ngo M.D.   On: 05/27/2020 19:24       [place]   [facility] If 7PM-7AM, please contact night-coverage at [email or web address], Office  [phone]   05/28/2020, 7:55 PM  LOS: 1 day

## 2020-05-29 DIAGNOSIS — R0902 Hypoxemia: Secondary | ICD-10-CM

## 2020-05-29 DIAGNOSIS — L02213 Cutaneous abscess of chest wall: Secondary | ICD-10-CM

## 2020-05-29 LAB — CBC WITH DIFFERENTIAL/PLATELET
Abs Immature Granulocytes: 0.02 10*3/uL (ref 0.00–0.07)
Basophils Absolute: 0 10*3/uL (ref 0.0–0.1)
Basophils Relative: 0 %
Eosinophils Absolute: 0 10*3/uL (ref 0.0–0.5)
Eosinophils Relative: 0 %
HCT: 35.6 % — ABNORMAL LOW (ref 36.0–46.0)
Hemoglobin: 11.2 g/dL — ABNORMAL LOW (ref 12.0–15.0)
Immature Granulocytes: 1 %
Lymphocytes Relative: 40 %
Lymphs Abs: 1.4 10*3/uL (ref 0.7–4.0)
MCH: 26.1 pg (ref 26.0–34.0)
MCHC: 31.5 g/dL (ref 30.0–36.0)
MCV: 83 fL (ref 80.0–100.0)
Monocytes Absolute: 0.1 10*3/uL (ref 0.1–1.0)
Monocytes Relative: 3 %
Neutro Abs: 2.1 10*3/uL (ref 1.7–7.7)
Neutrophils Relative %: 56 %
Platelets: 195 10*3/uL (ref 150–400)
RBC: 4.29 MIL/uL (ref 3.87–5.11)
RDW: 15.5 % (ref 11.5–15.5)
WBC: 3.6 10*3/uL — ABNORMAL LOW (ref 4.0–10.5)
nRBC: 0 % (ref 0.0–0.2)

## 2020-05-29 LAB — COMPREHENSIVE METABOLIC PANEL
ALT: 24 U/L (ref 0–44)
AST: 32 U/L (ref 15–41)
Albumin: 3 g/dL — ABNORMAL LOW (ref 3.5–5.0)
Alkaline Phosphatase: 63 U/L (ref 38–126)
Anion gap: 7 (ref 5–15)
BUN: 22 mg/dL — ABNORMAL HIGH (ref 6–20)
CO2: 30 mmol/L (ref 22–32)
Calcium: 8.5 mg/dL — ABNORMAL LOW (ref 8.9–10.3)
Chloride: 103 mmol/L (ref 98–111)
Creatinine, Ser: 0.73 mg/dL (ref 0.44–1.00)
GFR calc Af Amer: >60 mL/min (ref >60)
GFR calc non Af Amer: >60 mL/min (ref >60)
Glucose, Bld: 216 mg/dL — ABNORMAL HIGH (ref 70–99)
Potassium: 4 mmol/L (ref 3.5–5.1)
Sodium: 140 mmol/L (ref 135–145)
Total Bilirubin: 0.5 mg/dL (ref 0.3–1.2)
Total Protein: 6.7 g/dL (ref 6.5–8.1)

## 2020-05-29 LAB — GLUCOSE, CAPILLARY
Glucose-Capillary: 183 mg/dL — ABNORMAL HIGH (ref 70–99)
Glucose-Capillary: 205 mg/dL — ABNORMAL HIGH (ref 70–99)
Glucose-Capillary: 149 mg/dL — ABNORMAL HIGH (ref 70–99)
Glucose-Capillary: 237 mg/dL — ABNORMAL HIGH (ref 70–99)

## 2020-05-29 LAB — FIBRIN DERIVATIVES D-DIMER (ARMC ONLY): Fibrin derivatives D-dimer (ARMC): 618.74 ng/mL (FEU) — ABNORMAL HIGH (ref 0.00–499.00)

## 2020-05-29 LAB — C-REACTIVE PROTEIN: CRP: 1.1 mg/dL — ABNORMAL HIGH (ref <1.0)

## 2020-05-29 LAB — FERRITIN: Ferritin: 702 ng/mL — ABNORMAL HIGH (ref 11–307)

## 2020-05-29 MED ORDER — ENSURE ENLIVE PO LIQD
237.0000 mL | Freq: Three times a day (TID) | ORAL | Status: DC
  Administered 2020-05-29 – 2020-05-31 (×4): 237 mL via ORAL

## 2020-05-29 MED ORDER — ADULT MULTIVITAMIN W/MINERALS CH
1.0000 | ORAL_TABLET | Freq: Every day | ORAL | Status: DC
  Administered 2020-05-29 – 2020-05-31 (×3): 1 via ORAL
  Filled 2020-05-29 (×3): qty 1

## 2020-05-29 NOTE — Progress Notes (Signed)
[facility]  PROGRESS NOTE  Lisa Howell ZJI:[id number] DOB: 1971/01/24 DOA: 05/27/2020 PCP: [facility]   Brief HPI:   49 year old female with known history of headaches, came to ED with onset of vomiting with 3 episodes of diarrhea.  Patient was found to have COVID-19 infection.  Chest x-ray showed multifocal pneumonia.  Inflammatory markers were elevated.    Subjective   Patient seen and examined, breathing has significantly improved.  Denies discharge from the chest lesion.  She was started on Augmentin yesterday.   Assessment/Plan:     1. Acute hypoxemic respiratory failure-chest x-ray shows multifocal pneumonia, patient started on remdesivir, Decadron, baricitinib.  CRP was 3.4.  CRP has come down to 1.1. 2. Anterior chest wall abscess-drained in the shower as per patient.  Started on Augmentin 1 tablet p.o. twice daily for 5 days. 3. Hypokalemia-potassium was repleted.   COVID-19 Labs  Recent Labs    05/27/20 2219 05/28/20 0415 05/29/20 0452  FERRITIN 417* 410* 702*  LDH 223*  --   --   CRP 3.8* 3.4* 1.1*    Lab Results  Component Value Date   SARSCOV2NAA POSITIVE (A) 05/27/2020     Scheduled medications:   . amoxicillin-clavulanate  1 tablet Oral Q12H  . vitamin C  500 mg Oral Daily  . baricitinib  4 mg Oral Daily  . cholecalciferol  1,000 Units Oral Daily  . dexamethasone (DECADRON) injection  6 mg Intravenous Q24H  . enoxaparin (LOVENOX) injection  40 mg Subcutaneous Q12H  . famotidine  20 mg Oral BID  . feeding supplement (ENSURE ENLIVE)  237 mL Oral TID BM  . guaiFENesin  600 mg Oral BID  . insulin aspart  0-9 Units Subcutaneous TID WC  . multivitamin with minerals  1 tablet Oral Daily  . zinc sulfate  220 mg Oral Daily         CBG: Recent Labs  Lab 05/28/20 1700 05/28/20 2152 05/29/20 0836 05/29/20 1227 05/29/20 1623  GLUCAP 131* 174* 183* 205* 149*    SpO2: 94 % O2 Flow Rate (L/min): 1 L/min     CBC: Recent Labs  Lab 05/27/20 1929 05/28/20 0415 05/29/20 0452  WBC 5.5 3.7* 3.6*  NEUTROABS  --  2.7 2.1  HGB 12.3 11.4* 11.2*  HCT 38.5 35.3* 35.6*  MCV 81.4 80.4 83.0  PLT 211 175 175    Basic Metabolic Panel: Recent Labs  Lab 05/27/20 2219 05/28/20 0415 05/29/20 0452  NA 136 136 140  K 3.4* 3.7 4.0  CL 97* 101 103  CO2 27 27 30   GLUCOSE 140* 243* 216*  BUN 23* 24* 22*  CREATININE 1.18* 0.78 0.73  CALCIUM 8.5* 8.0* 8.5*  MG  --  2.0  --      Liver Function Tests: Recent Labs  Lab 05/27/20 2219 05/28/20 0415 05/29/20 0452  AST 48* 40 32  ALT 27 28 24   ALKPHOS 71 69 63  BILITOT 0.7 0.7 0.5  PROT 7.4 7.0 6.7  ALBUMIN 3.4* 3.0* 3.0*     Antibiotics: Anti-infectives (From admission, onward)   Start     Dose/Rate Route Frequency Ordered Stop   05/28/20 1400  amoxicillin-clavulanate (AUGMENTIN) 875-125 MG per tablet 1 tablet        1 tablet Oral Every 12 hours 05/28/20 1353     05/28/20 1000  remdesivir 100 mg in sodium chloride 0.9 % 100 mL IVPB       "Followed by" Linked Group Details   100  mg 200 mL/hr over 30 Minutes Intravenous Daily 05/27/20 2149 06/01/20 0959   05/28/20 0000  remdesivir 200 mg in sodium chloride 0.9% 250 mL IVPB       "Followed by" Linked Group Details   200 mg 580 mL/hr over 30 Minutes Intravenous Once 05/27/20 2149 05/28/20 0119       DVT prophylaxis: Lovenox  Code Status: Full code  Family Communication: No family at bedside    Status is: Inpatient  Dispo: The patient is from: Home              Anticipated d/c is to: Home              Anticipated d/c date is: 05/30/2020              Patient currently not medically stable for discharge  Barrier to discharge-COVID-19 pneumonia       Consultants:    Procedures:     Objective   Vitals:   05/29/20 0603 05/29/20 0841 05/29/20 1225 05/29/20 1625  BP: 112/78 116/68 116/79 124/79  Pulse: 75 71 74 67  Resp: 16 (!) 22  16  Temp: 98.5 F (36.9 C) 98  F (36.7 C) 98 F (36.7 C) 98.4 F (36.9 C)  TempSrc: Oral Oral Oral Oral  SpO2: 92% 91% 96% 94%  Weight:      Height:        Intake/Output Summary (Last 24 hours) at 05/29/2020 1712 Last data filed at 05/29/2020 0300 Gross per 24 hour  Intake 0 ml  Output --  Net 0 ml    08/17 1901 - 08/19 0700 In: 580 [P.O.:480] Out: -   Filed Weights   05/27/20 1615  Weight: 104.3 kg    Physical Examination:   General-appears in no acute distress  Heart-S1-S2, regular, no murmur auscultated  Lungs-clear to auscultation bilaterally, no wheezing or crackles auscultated  Abdomen-soft, nontender, no organomegaly  Extremities-no edema in the lower extremities  Neuro-alert, oriented x3, no focal deficit noted    Data Reviewed:   Recent Results (from the past 240 hour(s))  SARS Coronavirus 2 by RT PCR (hospital order, performed in [place] hospital lab) Nasopharyngeal Nasopharyngeal Swab     Status: Abnormal   Collection Time: 05/27/20  9:20 PM   Specimen: Nasopharyngeal Swab  Result Value Ref Range Status   SARS Coronavirus 2 POSITIVE (A) NEGATIVE Final    Comment: RESULT CALLED TO, READ BACK BY AND VERIFIED WITH: NOAH GRIFFITH 05/27/20 AT 2221 BY ACR (NOTE) SARS-CoV-2 target nucleic acids are DETECTED  SARS-CoV-2 RNA is generally detectable in upper respiratory specimens  during the acute phase of infection.  Positive results are indicative  of the presence of the identified virus, but do not rule out bacterial infection or co-infection with other pathogens not detected by the test.  Clinical correlation with patient history and  other diagnostic information is necessary to determine patient infection status.  The expected result is negative.  Fact Sheet for Patients:   StrictlyIdeas.no   Fact Sheet for Healthcare Providers:   BankingDealers.co.za    This test is not yet approved or cleared by the [place] FDA and   has been authorized for detection and/or diagnosis of SARS-CoV-2 by FDA under an Emergency Use Authorization (EUA).  This EUA will remain in effect (meaning this  test can be used) for the duration of  the COVID-19 declaration under Section 564(b)(1) of the Act, 21 U.S.C. section 360-bbb-3(b)(1), unless the authorization is terminated or  revoked sooner.  Performed at [facility], [address] [place], [place] [address]     No results for input(s): LIPASE, AMYLASE in the last 168 hours. No results for input(s): AMMONIA in the last 168 hours.  Cardiac Enzymes: No results for input(s): CKTOTAL, CKMB, CKMBINDEX, TROPONINI in the last 168 hours. BNP (last 3 results) Recent Labs    05/27/20 2219  BNP 17.5     Studies:  DG Chest 1 View  Result Date: 05/27/2020 CLINICAL DATA:  Vomiting, diarrhea, COVID-19 positive 05/18/2020 EXAM: CHEST  1 VIEW COMPARISON:  09/16/2017 FINDINGS: 2 frontal views of the chest demonstrate an unremarkable cardiac silhouette. There is patchy multifocal bilateral airspace disease greatest in the left mid and lower lung zones and right lung base. No effusion or pneumothorax. No acute bony abnormalities. IMPRESSION: 1. Multifocal bilateral pneumonia compatible with COVID 19. Electronically Signed   By: Randa Ngo M.D.   On: 05/27/2020 19:24       [place]   [facility] If 7PM-7AM, please contact night-coverage at [email or web address], Office  [phone]   05/29/2020, 5:12 PM  LOS: 2 days

## 2020-05-29 NOTE — Progress Notes (Signed)
Initial Nutrition Assessment  DOCUMENTATION CODES:   Morbid obesity  INTERVENTION:  Ensure Enlive po TID, each supplement provides 350 kcal and 20 grams of protein  MVI with minerals daily  Liberalize Diet  Encourage po intake of meals/supplements   NUTRITION DIAGNOSIS:   Inadequate oral intake related to acute illness, poor appetite (COVID PNA) as evidenced by per patient/family report, meal completion < 50%.  GOAL:   Patient will meet greater than or equal to 90% of their needs   MONITOR:   Labs, PO intake, Supplement acceptance, Weight trends, I & O's, Skin  REASON FOR ASSESSMENT:   Malnutrition Screening Tool    ASSESSMENT:  RD working remotely.  49 year old female admitted with pneumonia secondary to COVID-19 virus infection with past medical history of headaches presented with onset of vomiting with 3 episodes of diarrhea, cough and mild dyspnea over the last week.  Able to speak with pt via phone this afternoon. Reports eating about half of a chef salad for lunch and ate a few bites of eggs for breakfast (10% per flowsheets) Patient states she is working her way back to more solid foods, endorses decreased appetite and poor po intake over the past week. Denies nausea, vomiting, diarrhea today. RD educated on the importance of adequate nutrition, encouraged po intake of meals/supplements. Currently, pt on [facility] diet which restricts protein and limits menu options. Will liberalize diet to regular and order Ensure as well as daily MVI to aid with meeting increased needs.   Current wt 229.46 lb, mild pitting BLE edema per RN assessment. No recent weight history for review, last wt per chart - 99.8 kg (219.56 lb) on 09/17/18  Medications reviewed and include: Augmentin, Vit C, Vit D3, Decadron, Pepcid, SSI, Zinc sulfate Remdesivir in NaCl 100 mg daily  Labs: CBGs 205,183,174,131 BUN 22 (H) Lab Results  Component Value Date   HGBA1C 6.9 (H) 05/28/2020    NUTRITION -  FOCUSED PHYSICAL EXAM: Unable to complete at this time, RD working remotely.  Diet Order:   Diet Order            Diet regular Room service appropriate? Yes; Fluid consistency: Thin  Diet effective now                 EDUCATION NEEDS:   Education needs have been addressed  Skin:  Skin Assessment: Skin Integrity Issues: Skin Integrity Issues:: Other (Comment) Other: non pressure wound; right lower breast;lightly draining;red;warm  Last BM:  8/18  Height:   Ht Readings from Last 1 Encounters:  05/27/20 5\' 1"  (1.549 m)    Weight:   Wt Readings from Last 1 Encounters:  05/27/20 104.3 kg    Ideal Body Weight:  47.7 kg  BMI:  Body mass index is 43.46 kg/m.  Estimated Nutritional Needs:   Kcal:  5035-4656  Protein:  120-135  Fluid:  >/= 2.4 L/day   Lajuan Lines, RD, LDN Clinical Nutrition After Hours/Weekend Pager # in [place]

## 2020-05-29 NOTE — TOC Initial Note (Signed)
Transition of Care [facility]) - Initial/Assessment Note    Patient Details  Name: Lisa Howell MRN: [id number] Date of Birth: 12/09/1970  Transition of Care [facility]) CM/SW Contact:    Shelbie Hutching, RN Phone Number: 05/29/2020, 12:54 PM  Clinical Narrative:                 Patient admitted to the hospital with COVID requiring acute O2.  Patient reports that she is from home and lives alone, she is independent at baseline and drives.  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  Patient confirms that she does not currently have insurance or a PCP.  Patient referred to [facility]- application given to patient for her to fill out and get back to either [facility] or MM.  Patient also provided with [place] of low and free health care options in [facility].   No other needs identified at this time.   Expected Discharge Plan: Home/Self Care Barriers to Discharge: Continued Medical Work up   Patient Goals and CMS Choice        Expected Discharge Plan and Services Expected Discharge Plan: Home/Self Care   Discharge Planning Services: CM Consult, [facility], Medication Assistance   Living arrangements for the past 2 months: Mobile Home                                      Prior Living Arrangements/Services Living arrangements for the past 2 months: Mobile Home Lives with:: Self Patient language and need for interpreter reviewed:: Yes Do you feel safe going back to the place where you live?: Yes      Need for Family Participation in Patient Care: No (Comment)     Criminal Activity/Legal Involvement Pertinent to Current Situation/Hospitalization: No - Comment as needed  Activities of Daily Living Home Assistive Devices/Equipment: None ADL Screening (condition at time of admission) Patient's cognitive ability adequate to safely complete daily activities?: Yes Is the patient deaf or have difficulty hearing?:  No Does the patient have difficulty seeing, even when wearing glasses/contacts?: No Does the patient have difficulty concentrating, remembering, or making decisions?: No Patient able to express need for assistance with ADLs?: Yes Does the patient have difficulty dressing or bathing?: No Independently performs ADLs?: Yes (appropriate for developmental age) Does the patient have difficulty walking or climbing stairs?: No Weakness of Legs: None Weakness of Arms/Hands: None  Permission Sought/Granted                  Emotional Assessment   Attitude/Demeanor/Rapport: Engaged Affect (typically observed): Accepting Orientation: : Oriented to Self, Oriented to Place, Oriented to  Time, Oriented to Situation Alcohol / Substance Use: Not Applicable Psych Involvement: No (comment)  Admission diagnosis:  Hypoxia [R09.02] Pneumonia due to 2019-nCoV [U07.1, J12.82] Pneumonia due to COVID-19 virus [U07.1, J12.82] Patient Active Problem List   Diagnosis Date Noted  . Pneumonia due to 2019-nCoV 05/27/2020   PCP:  [facility], [place]:   [id number] [place], [place] - [address] [place] [address] Phone: [phone] Fax: [phone]     Social Determinants of Health (SDOH) Interventions    Readmission Risk Interventions No flowsheet data found.

## 2020-05-30 DIAGNOSIS — L02213 Cutaneous abscess of chest wall: Secondary | ICD-10-CM

## 2020-05-30 DIAGNOSIS — R0902 Hypoxemia: Secondary | ICD-10-CM

## 2020-05-30 DIAGNOSIS — E119 Type 2 diabetes mellitus without complications: Secondary | ICD-10-CM

## 2020-05-30 LAB — GLUCOSE, CAPILLARY
Glucose-Capillary: 197 mg/dL — ABNORMAL HIGH (ref 70–99)
Glucose-Capillary: 260 mg/dL — ABNORMAL HIGH (ref 70–99)
Glucose-Capillary: 163 mg/dL — ABNORMAL HIGH (ref 70–99)
Glucose-Capillary: 185 mg/dL — ABNORMAL HIGH (ref 70–99)

## 2020-05-30 LAB — CBC WITH DIFFERENTIAL/PLATELET
Abs Immature Granulocytes: 0.03 10*3/uL (ref 0.00–0.07)
Basophils Absolute: 0 10*3/uL (ref 0.0–0.1)
Basophils Relative: 0 %
Eosinophils Absolute: 0 10*3/uL (ref 0.0–0.5)
Eosinophils Relative: 0 %
HCT: 34.8 % — ABNORMAL LOW (ref 36.0–46.0)
Hemoglobin: 11 g/dL — ABNORMAL LOW (ref 12.0–15.0)
Immature Granulocytes: 1 %
Lymphocytes Relative: 34 %
Lymphs Abs: 1.5 10*3/uL (ref 0.7–4.0)
MCH: 25.9 pg — ABNORMAL LOW (ref 26.0–34.0)
MCHC: 31.6 g/dL (ref 30.0–36.0)
MCV: 81.9 fL (ref 80.0–100.0)
Monocytes Absolute: 0.2 10*3/uL (ref 0.1–1.0)
Monocytes Relative: 3 %
Neutro Abs: 2.7 10*3/uL (ref 1.7–7.7)
Neutrophils Relative %: 62 %
Platelets: 201 10*3/uL (ref 150–400)
RBC: 4.25 MIL/uL (ref 3.87–5.11)
RDW: 15.4 % (ref 11.5–15.5)
WBC: 4.4 10*3/uL (ref 4.0–10.5)
nRBC: 0 % (ref 0.0–0.2)

## 2020-05-30 LAB — COMPREHENSIVE METABOLIC PANEL
ALT: 22 U/L (ref 0–44)
AST: 25 U/L (ref 15–41)
Albumin: 3 g/dL — ABNORMAL LOW (ref 3.5–5.0)
Alkaline Phosphatase: 62 U/L (ref 38–126)
Anion gap: 6 (ref 5–15)
BUN: 24 mg/dL — ABNORMAL HIGH (ref 6–20)
CO2: 29 mmol/L (ref 22–32)
Calcium: 8.6 mg/dL — ABNORMAL LOW (ref 8.9–10.3)
Chloride: 103 mmol/L (ref 98–111)
Creatinine, Ser: 0.82 mg/dL (ref 0.44–1.00)
GFR calc Af Amer: >60 mL/min (ref >60)
GFR calc non Af Amer: >60 mL/min (ref >60)
Glucose, Bld: 234 mg/dL — ABNORMAL HIGH (ref 70–99)
Potassium: 4.3 mmol/L (ref 3.5–5.1)
Sodium: 138 mmol/L (ref 135–145)
Total Bilirubin: 0.5 mg/dL (ref 0.3–1.2)
Total Protein: 6.5 g/dL (ref 6.5–8.1)

## 2020-05-30 LAB — FIBRIN DERIVATIVES D-DIMER (ARMC ONLY): Fibrin derivatives D-dimer (ARMC): 440.51 ng/mL (FEU) (ref 0.00–499.00)

## 2020-05-30 LAB — C-REACTIVE PROTEIN: CRP: 0.6 mg/dL (ref <1.0)

## 2020-05-30 LAB — FERRITIN: Ferritin: 641 ng/mL — ABNORMAL HIGH (ref 11–307)

## 2020-05-30 MED ORDER — LIVING WELL WITH DIABETES BOOK
Freq: Once | Status: AC
  Filled 2020-05-30: qty 1

## 2020-05-30 NOTE — TOC Progression Note (Addendum)
Transition of Care [facility]) - Progression Note    Patient Details  Name: Lisa Howell MRN: [id number] Date of Birth: January 18, 1971  Transition of Care [facility]) CM/SW Contact  Shade Flood, LCSW Phone Number: 05/30/2020, 11:22 AM  Clinical Narrative:     TOC following. Discussed pt status with MD this AM. MD states he is anticipating pt will be stable for dc tomorrow. Per MD, only new med will be Decadron which is approximately $4 at [facility]. Discussed with pt who stated she could afford this. Updated MD. Pt should not need anything ordered from the [facility].  Weekend TOC will be available if needed.  1530: Receive message from Diabetes RN Educator stating pt would need assistance with glucometer, test strips, and lancets for dc. Items obtained from [facility] supply donated by [facility]. Provided to RN to provide to pt.  Expected Discharge Plan: Home/Self Care Barriers to Discharge: Continued Medical Work up  Expected Discharge Plan and Services Expected Discharge Plan: Home/Self Care   Discharge Planning Services: CM Consult, [facility], Medication Assistance   Living arrangements for the past 2 months: Mobile Home                                       Social Determinants of Health (SDOH) Interventions    Readmission Risk Interventions No flowsheet data found.

## 2020-05-30 NOTE — Progress Notes (Signed)
[facility]  PROGRESS NOTE  Lisa Howell SHF:[id number] DOB: 1970-12-23 DOA: 05/27/2020 PCP: [facility]   Brief HPI:   49 year old female with known history of headaches, came to ED with onset of vomiting with 3 episodes of diarrhea.  Patient was found to have COVID-19 infection.  Chest x-ray showed multifocal pneumonia.  Inflammatory markers were elevated.  Subjective   Patient seen and examined, breathing has improved.  CRP down to 0.6.  She is currently off oxygen.   Assessment/Plan:     1. Acute hypoxemic respiratory failure-chest x-ray shows multifocal pneumonia, patient started on remdesivir, Decadron, baricitinib.  CRP was 3.4.  CRP has come down to 0.6.  Today is day #4 of remdesivir. 2. Anterior chest wall /breast abscess-drained in the shower as per patient.  Started on Augmentin 1 tablet p.o. twice daily for 5 days.  Reexamined with RN at bedside, lesion has almost healed. 3. Hypokalemia-potassium was repleted. 4. Diabetes mellitus type 2-hemoglobin A1c is 6.9.  Blood glucose is now elevated likely from Decadron.  Continue sliding scale insulin with NovoLog.   [place]    05/27/20 2219 05/27/20 2219 05/28/20 0415 05/29/20 0452 05/30/20 0439  FERRITIN 417*   < > 410* 702* 641*  LDH 223*  --   --   --   --   CRP 3.8*   < > 3.4* 1.1* 0.6   < > = values in this interval not displayed.    Lab Results  Component Value Date   SARSCOV2NAA POSITIVE (A) 05/27/2020     Scheduled medications:   . amoxicillin-clavulanate  1 tablet Oral Q12H  . vitamin C  500 mg Oral Daily  . baricitinib  4 mg Oral Daily  . cholecalciferol  1,000 Units Oral Daily  . dexamethasone (DECADRON) injection  6 mg Intravenous Q24H  . enoxaparin (LOVENOX) injection  40 mg Subcutaneous Q12H  . famotidine  20 mg Oral BID  . feeding supplement (ENSURE ENLIVE)  237 mL Oral TID BM  . guaiFENesin  600 mg Oral BID  . insulin aspart  0-9 Units  Subcutaneous TID WC  . multivitamin with minerals  1 tablet Oral Daily  . zinc sulfate  220 mg Oral Daily         CBG: Recent Labs  Lab 05/29/20 1227 05/29/20 1623 05/29/20 2023 05/30/20 0841 05/30/20 1217  GLUCAP 205* 149* 237* 197* 260*    SpO2: 93 % O2 Flow Rate (L/min): 1 L/min    CBC: Recent Labs  Lab 05/27/20 1929 05/28/20 0415 05/29/20 0452 05/30/20 0439  WBC 5.5 3.7* 3.6* 4.4  NEUTROABS  --  2.7 2.1 2.7  HGB 12.3 11.4* 11.2* 11.0*  HCT 38.5 35.3* 35.6* 34.8*  MCV 81.4 80.4 83.0 81.9  PLT 211 175 195 502    Basic Metabolic Panel: Recent Labs  Lab 05/27/20 2219 05/28/20 0415 05/29/20 0452 05/30/20 0439  NA 136 136 140 138  K 3.4* 3.7 4.0 4.3  CL 97* 101 103 103  CO2 27 27 30 29   GLUCOSE 140* 243* 216* 234*  BUN 23* 24* 22* 24*  CREATININE 1.18* 0.78 0.73 0.82  CALCIUM 8.5* 8.0* 8.5* 8.6*  MG  --  2.0  --   --      Liver Function Tests: Recent Labs  Lab 05/27/20 2219 05/28/20 0415 05/29/20 0452 05/30/20 0439  AST 48* 40 32 25  ALT 27 28 24 22   ALKPHOS 71 69 63 62  BILITOT 0.7  0.7 0.5 0.5  PROT 7.4 7.0 6.7 6.5  ALBUMIN 3.4* 3.0* 3.0* 3.0*     Antibiotics: Anti-infectives (From admission, onward)   Start     Dose/Rate Route Frequency Ordered Stop   05/28/20 1400  amoxicillin-clavulanate (AUGMENTIN) 875-125 MG per tablet 1 tablet        1 tablet Oral Every 12 hours 05/28/20 1353     05/28/20 1000  remdesivir 100 mg in sodium chloride 0.9 % 100 mL IVPB       "Followed by" Linked Group Details   100 mg 200 mL/hr over 30 Minutes Intravenous Daily 05/27/20 2149 06/01/20 0959   05/28/20 0000  remdesivir 200 mg in sodium chloride 0.9% 250 mL IVPB       "Followed by" Linked Group Details   200 mg 580 mL/hr over 30 Minutes Intravenous Once 05/27/20 2149 05/28/20 0119       DVT prophylaxis: Lovenox  Code Status: Full code  Family Communication: No family at bedside    Status is: Inpatient  Dispo: The patient is from: Home               Anticipated d/c is to: Home              Anticipated d/c date is: 05/31/2020              Patient currently not medically stable for discharge  Barrier to discharge-COVID-19 pneumonia       Consultants:    Procedures:     Objective   Vitals:   05/29/20 1625 05/29/20 2021 05/30/20 0042 05/30/20 0843  BP: 124/79 117/85 116/81 (!) 144/90  Pulse: 67 76 70 68  Resp: 16 20 20 16   Temp: 98.4 F (36.9 C) 98.2 F (36.8 C) 98.2 F (36.8 C) 97.6 F (36.4 C)  TempSrc: Oral Oral    SpO2: 94% 90% 92% 93%  Weight:      Height:        Intake/Output Summary (Last 24 hours) at 05/30/2020 1304 Last data filed at 05/30/2020 1029 Gross per 24 hour  Intake 340 ml  Output --  Net 340 ml    No intake/output data recorded.  Filed Weights   05/27/20 1615  Weight: 104.3 kg    Physical Examination:   General-appears in no acute distress  Heart-S1-S2, regular, no murmur auscultated  Lungs-clear to auscultation bilaterally, no wheezing or crackles auscultated  Abdomen-soft, nontender, no organomegaly  Extremities-no edema in the lower extremities  Neuro-alert, oriented x3, no focal deficit noted  Skin-anterior chest wall lesion on right breast is almost healed.  Dusky erythematous lesion noted, no discharge.  No tenderness to palpation.    Data Reviewed:   Recent Results (from the past 240 hour(s))  SARS Coronavirus 2 by RT PCR (hospital order, performed in [facility] hospital lab) Nasopharyngeal Nasopharyngeal Swab     Status: Abnormal   Collection Time: 05/27/20  9:20 PM   Specimen: Nasopharyngeal Swab  Result Value Ref Range Status   SARS Coronavirus 2 POSITIVE (A) NEGATIVE Final    Comment: RESULT CALLED TO, READ BACK BY AND VERIFIED WITH: NOAH GRIFFITH 05/27/20 AT 2221 BY ACR (NOTE) SARS-CoV-2 target nucleic acids are DETECTED  SARS-CoV-2 RNA is generally detectable in upper respiratory specimens  during the acute phase of infection.  Positive  results are indicative  of the presence of the identified virus, but do not rule out bacterial infection or co-infection with other pathogens not detected by the test.  Clinical correlation  with patient history and  other diagnostic information is necessary to determine patient infection status.  The expected result is negative.  Fact Sheet for Patients:   StrictlyIdeas.no   Fact Sheet for Healthcare Providers:   BankingDealers.co.za    This test is not yet approved or cleared by the [place] FDA and  has been authorized for detection and/or diagnosis of SARS-CoV-2 by FDA under an Emergency Use Authorization (EUA).  This EUA will remain in effect (meaning this  test can be used) for the duration of  the COVID-19 declaration under Section 564(b)(1) of the Act, 21 U.S.C. section 360-bbb-3(b)(1), unless the authorization is terminated or revoked sooner.  Performed at [facility], [address] [place], [place] [address]      BNP (last 3 results) Recent Labs    05/27/20 2219  BNP 17.5    [place]   [facility] If 7PM-7AM, please contact night-coverage at [email or web address], Office  [phone]   05/30/2020, 1:04 PM  LOS: 3 days

## 2020-05-30 NOTE — Progress Notes (Signed)
Inpatient Diabetes Program Recommendations  AACE/ADA: New Consensus Statement on Inpatient Glycemic Control (2015)  Target Ranges:  Prepandial:   less than 140 mg/dL      Peak postprandial:   less than 180 mg/dL (1-2 hours)      Critically ill patients:  140 - 180 mg/dL   Lab Results  Component Value Date   GLUCAP 260 (H) 05/30/2020   HGBA1C 6.9 (H) 05/28/2020    Review of Glycemic Control Results for SHAKETA, SERAFIN (MRN [id number]) as of 05/30/2020 14:22  Ref. Range 05/29/2020 12:27 05/29/2020 16:23 05/29/2020 20:23 05/30/2020 08:41 05/30/2020 12:17  Glucose-Capillary Latest Ref Range: 70 - 99 mg/dL 205 (H) 149 (H) 237 (H) 197 (H) 260 (H)   Diabetes history: New diagnosis of DM Outpatient Diabetes medications: None Current orders for Inpatient glycemic control:  Decadron 6 mg IV daily Novolog sensitive tid with meals  Inpatient Diabetes Program Recommendations:    Spoke with patient regarding elevated A1C of 6.9%. Reviewed normal A1C level with patient and diagnostic criteria for DM.  She states that she knew blood sugars were up from the steroids.  I recommended to her that she get close f/u with PCP (referred to clinic) regarding blood sugars.  We discussed normal blood sugar values of 80-130 mg/dL.  Requested that she monitor at least bid while on steroids at home. She endorses drinking lots of sweet tea and mello-yellow.  Encouraged her to eliminate sugar from all beverages and reduce CHO intake.  She states that she has family members with DM and is familiar with monitoring.  Ordered LWWD booklet for patient as well for her to read.  Also have asked TOC if patient can get meter from the hospital since she does not have insurance and has new diagnosis of DM.    Patient appreciative of information. Will need close f/u.   Thanks,  Adah Perl, RN, BC-ADM Inpatient Diabetes Coordinator Pager [phone] (8a-5p)

## 2020-05-31 DIAGNOSIS — U071 COVID-19: Principal | ICD-10-CM

## 2020-05-31 DIAGNOSIS — J1282 Pneumonia due to coronavirus disease 2019: Secondary | ICD-10-CM

## 2020-05-31 DIAGNOSIS — E119 Type 2 diabetes mellitus without complications: Secondary | ICD-10-CM

## 2020-05-31 LAB — COMPREHENSIVE METABOLIC PANEL
ALT: 24 U/L (ref 0–44)
AST: 28 U/L (ref 15–41)
Albumin: 3.2 g/dL — ABNORMAL LOW (ref 3.5–5.0)
Alkaline Phosphatase: 59 U/L (ref 38–126)
Anion gap: 12 (ref 5–15)
BUN: 20 mg/dL (ref 6–20)
CO2: 25 mmol/L (ref 22–32)
Calcium: 8.5 mg/dL — ABNORMAL LOW (ref 8.9–10.3)
Chloride: 102 mmol/L (ref 98–111)
Creatinine, Ser: 0.75 mg/dL (ref 0.44–1.00)
GFR calc Af Amer: >60 mL/min (ref >60)
GFR calc non Af Amer: >60 mL/min (ref >60)
Glucose, Bld: 247 mg/dL — ABNORMAL HIGH (ref 70–99)
Potassium: 3.8 mmol/L (ref 3.5–5.1)
Sodium: 139 mmol/L (ref 135–145)
Total Bilirubin: 0.8 mg/dL (ref 0.3–1.2)
Total Protein: 6.5 g/dL (ref 6.5–8.1)

## 2020-05-31 LAB — CBC WITH DIFFERENTIAL/PLATELET
Abs Immature Granulocytes: 0.08 10*3/uL — ABNORMAL HIGH (ref 0.00–0.07)
Basophils Absolute: 0 10*3/uL (ref 0.0–0.1)
Basophils Relative: 0 %
Eosinophils Absolute: 0 10*3/uL (ref 0.0–0.5)
Eosinophils Relative: 0 %
HCT: 35 % — ABNORMAL LOW (ref 36.0–46.0)
Hemoglobin: 11.5 g/dL — ABNORMAL LOW (ref 12.0–15.0)
Immature Granulocytes: 2 %
Lymphocytes Relative: 35 %
Lymphs Abs: 1.3 10*3/uL (ref 0.7–4.0)
MCH: 26.3 pg (ref 26.0–34.0)
MCHC: 32.9 g/dL (ref 30.0–36.0)
MCV: 80.1 fL (ref 80.0–100.0)
Monocytes Absolute: 0.2 10*3/uL (ref 0.1–1.0)
Monocytes Relative: 4 %
Neutro Abs: 2.1 10*3/uL (ref 1.7–7.7)
Neutrophils Relative %: 59 %
Platelets: 221 10*3/uL (ref 150–400)
RBC: 4.37 MIL/uL (ref 3.87–5.11)
RDW: 15 % (ref 11.5–15.5)
WBC: 3.7 10*3/uL — ABNORMAL LOW (ref 4.0–10.5)
nRBC: 0 % (ref 0.0–0.2)

## 2020-05-31 LAB — C-REACTIVE PROTEIN: CRP: <0.5 mg/dL (ref <1.0)

## 2020-05-31 LAB — FERRITIN: Ferritin: 488 ng/mL — ABNORMAL HIGH (ref 11–307)

## 2020-05-31 LAB — FIBRIN DERIVATIVES D-DIMER (ARMC ONLY): Fibrin derivatives D-dimer (ARMC): 358.38 ng/mL (FEU) (ref 0.00–499.00)

## 2020-05-31 LAB — GLUCOSE, CAPILLARY: Glucose-Capillary: 204 mg/dL — ABNORMAL HIGH (ref 70–99)

## 2020-05-31 MED ORDER — DEXAMETHASONE 6 MG PO TABS: 6.0000 mg | ORAL_TABLET | Freq: Every day | ORAL | 0 refills | Status: DC

## 2020-05-31 MED ORDER — GUAIFENESIN ER 600 MG PO TB12: 600.0000 mg | ORAL_TABLET | Freq: Two times a day (BID) | ORAL | 0 refills | Status: AC

## 2020-05-31 MED ORDER — METFORMIN HCL 500 MG PO TABS: 500.0000 mg | ORAL_TABLET | Freq: Two times a day (BID) | ORAL | 11 refills | Status: DC

## 2020-05-31 MED ORDER — BLOOD GLUCOSE MONITOR KIT: PACK | 0 refills | Status: DC

## 2020-05-31 NOTE — TOC Transition Note (Signed)
Transition of Care [facility]) - CM/SW Discharge Note   Patient Details  Name: JAELYNE DEEG MRN: [id number] Date of Birth: January 02, 1971  Transition of Care [facility]) CM/SW Contact:  Shelbie Hutching, RN Phone Number: 05/31/2020, 11:25 AM   Clinical Narrative:    Patient is medically stable for discharge home.  Patient will get her prescriptions from [facility].  Referral was given to [facility] earlier in the week and patient has the application to fill out.   RNCM signed off, no other needs identified.     Final next level of care: Home/Self Care Barriers to Discharge: Barriers Resolved   Patient Goals and CMS Choice        Discharge Placement                       Discharge Plan and Services   Discharge Planning Services: CM Consult, [facility], Medication Assistance                                 Social Determinants of Health (SDOH) Interventions     Readmission Risk Interventions No flowsheet data found.

## 2020-05-31 NOTE — Discharge Summary (Signed)
Physician Discharge Summary  Lisa Howell EGB:[id number] DOB: [birth year] DOA: 05/27/2020  PCP: [facility] [place] date: 05/27/2020 Discharge date: 05/31/2020  Time spent: 50 minutes  Recommendations for Outpatient Follow-up:  1. Follow-up PCP in 3 weeks 2. Continue home isolation till 06/17/2020.  Total 3 weeks starting from 05/27/2020.  Discharge Diagnoses:  Active Problems:   Pneumonia due to 2019-nCoV   Discharge Condition: Stable  Diet recommendation: Heart healthy diet  Filed Weights   05/27/20 1615  Weight: 104.3 kg    History of present illness:  49 year old female with known history of headaches, came to ED with onset of vomiting with 3 episodes of diarrhea.  Patient was found to have COVID-19 infection.  Chest x-ray showed multifocal pneumonia.  Inflammatory markers were elevated.  Hospital Course:   1. Acute hypoxemic respiratory failure-resolved, chest x-ray shows multifocal pneumonia, patient was started on remdesivir, Decadron, baricitinib.  CRP was 3.4.  CRP has come down to 0.6.  She has completed 5 days of remdesivir.  Will discharge on Decadron 6 mg p.o. daily for 5 more days. 2. Anterior chest wall /breast abscess-drained in the shower as per patient.  Started on Augmentin 1 tablet p.o. twice daily for 5 days.  Reexamined with RN at bedside, lesion has almost healed.  Will not discharge on antibiotics.  He received antibiotics for 3 days in the hospital. 3. Hypokalemia-potassium was replaced. 4. Diabetes mellitus type 2-hemoglobin A1c is 6.9.  Blood glucose is now elevated likely from Decadron.  We will discharge her on Metformin 5 mg p.o. twice daily.  Needs to follow-up with PCP for further management of diabetes mellitus.   Procedures:    Consultations:    Discharge Exam: Vitals:   05/30/20 2044 05/31/20 0521  BP: (!) 138/98 138/80  Pulse: 76 67  Resp: 18 18  Temp: 98.4 F (36.9 C) 97.8 F (36.6 C)  SpO2: 94% 93%     General: Appears in no acute distress Cardiovascular: S1-S2, regular Respiratory: Clear to auscultation bilaterally  Discharge Instructions   Discharge Instructions    Diet - low sodium heart healthy   Complete by: As directed    Discharge instructions   Complete by: As directed    Need to isolate at home for 21 days starting from 05/27/20. Till 06/17/20. Maintain Covid 19 isolation precautions   Increase activity slowly   Complete by: As directed    No wound care   Complete by: As directed      Allergies as of 05/31/2020      Reactions   Aspirin Other (See Comments)   Drowsy       Medication List    TAKE these medications   blood glucose meter kit and supplies Kit Dispense based on patient and insurance preference. Use up to four times daily as directed. (FOR ICD-9 250.00, 250.01).   dexamethasone 6 MG tablet Commonly known as: DECADRON Take 1 tablet (6 mg total) by mouth daily.   guaiFENesin 600 MG 12 hr tablet Commonly known as: MUCINEX Take 1 tablet (600 mg total) by mouth 2 (two) times daily for 5 days.   metFORMIN 500 MG tablet Commonly known as: Glucophage Take 1 tablet (500 mg total) by mouth 2 (two) times daily with a meal.      Allergies  Allergen Reactions   Aspirin Other (See Comments)    [place], [employer] Follow up in 3 week(s).  Contact information: [address] [place] [address] [phone]                The results of significant diagnostics from this hospitalization (including imaging, microbiology, ancillary and laboratory) are listed below for reference.    Significant Diagnostic Studies: DG Chest 1 View  Result Date: 05/27/2020 CLINICAL DATA:  Vomiting, diarrhea, COVID-19 positive 05/18/2020 EXAM: CHEST  1 VIEW COMPARISON:  09/16/2017 FINDINGS: 2 frontal views of the chest demonstrate an unremarkable cardiac silhouette. There is patchy multifocal bilateral airspace  disease greatest in the left mid and lower lung zones and right lung base. No effusion or pneumothorax. No acute bony abnormalities. IMPRESSION: 1. Multifocal bilateral pneumonia compatible with COVID 19. Electronically Signed   By: Randa Ngo M.D.   On: 05/27/2020 19:24    Microbiology: Recent Results (from the past 240 hour(s))  SARS Coronavirus 2 by RT PCR (hospital order, performed in [facility] hospital lab) Nasopharyngeal Nasopharyngeal Swab     Status: Abnormal   Collection Time: 05/27/20  9:20 PM   Specimen: Nasopharyngeal Swab  Result Value Ref Range Status   SARS Coronavirus 2 POSITIVE (A) NEGATIVE Final    Comment: RESULT CALLED TO, READ BACK BY AND VERIFIED WITH: NOAH GRIFFITH 05/27/20 AT 2221 BY ACR (NOTE) SARS-CoV-2 target nucleic acids are DETECTED  SARS-CoV-2 RNA is generally detectable in upper respiratory specimens  during the acute phase of infection.  Positive results are indicative  of the presence of the identified virus, but do not rule out bacterial infection or co-infection with other pathogens not detected by the test.  Clinical correlation with patient history and  other diagnostic information is necessary to determine patient infection status.  The expected result is negative.  Fact Sheet for Patients:   StrictlyIdeas.no   Fact Sheet for Healthcare Providers:   BankingDealers.co.za    This test is not yet approved or cleared by the [place] FDA and  has been authorized for detection and/or diagnosis of SARS-CoV-2 by FDA under an Emergency Use Authorization (EUA).  This EUA will remain in effect (meaning this  test can be used) for the duration of  the COVID-19 declaration under Section 564(b)(1) of the Act, 21 U.S.C. section 360-bbb-3(b)(1), unless the authorization is terminated or revoked sooner.  Performed at [facility], [address] [place],  [address]       Labs: Basic Metabolic Panel: Recent Labs  Lab 05/27/20 2219 05/28/20 0415 05/29/20 0452 05/30/20 0439 05/31/20 0530  NA 136 136 140 138 139  K 3.4* 3.7 4.0 4.3 3.8  CL 97* 101 103 103 102  CO2 27 27 30 29 25   GLUCOSE 140* 243* 216* 234* 247*  BUN 23* 24* 22* 24* 20  CREATININE 1.18* 0.78 0.73 0.82 0.75  CALCIUM 8.5* 8.0* 8.5* 8.6* 8.5*  MG  --  2.0  --   --   --    Liver Function Tests: Recent Labs  Lab 05/27/20 2219 05/28/20 0415 05/29/20 0452 05/30/20 0439 05/31/20 0530  AST 48* 40 32 25 28  ALT 27 28 24 22 24   ALKPHOS 71 69 63 62 59  BILITOT 0.7 0.7 0.5 0.5 0.8  PROT 7.4 7.0 6.7 6.5 6.5  ALBUMIN 3.4* 3.0* 3.0* 3.0* 3.2*   No results for input(s): LIPASE, AMYLASE in the last 168 hours. No results for input(s): AMMONIA in the last 168 hours. CBC: Recent Labs  Lab 05/27/20 1929 05/28/20 0415 05/29/20 0452 05/30/20 0439 05/31/20 0530  WBC 5.5 3.7* 3.6* 4.4 3.7*  NEUTROABS  --  2.7 2.1 2.7 2.1  HGB 12.3 11.4* 11.2* 11.0* 11.5*  HCT 38.5 35.3* 35.6* 34.8* 35.0*  MCV 81.4 80.4 83.0 81.9 80.1  PLT 211 175 195 201 221   Cardiac Enzymes: No results for input(s): CKTOTAL, CKMB, CKMBINDEX, TROPONINI in the last 168 hours. BNP: BNP (last 3 results) Recent Labs    05/27/20 2219  BNP 17.5    ProBNP (last 3 results) No results for input(s): PROBNP in the last 8760 hours.  CBG: Recent Labs  Lab 05/30/20 0841 05/30/20 1217 05/30/20 1747 05/30/20 2042 05/31/20 0821  GLUCAP 197* 260* 163* 185* 204*       Signed:  Oswald Hillock MD.  [facility] 05/31/2020, 11:14 AM

## 2020-05-31 NOTE — Discharge Instructions (Signed)
Prevent the Spread of COVID-19 if You Are Sick If you are sick with COVID-19 or think you might have COVID-19, follow the steps below to care for yourself and to help protect other people in your home and community. Stay home except to get medical care.  Stay home. Most people with COVID-19 have mild illness and are able to recover at home without medical care. Do not leave your home, except to get medical care. Do not visit public areas.  Take care of yourself. Get rest and stay hydrated. Take over-the-counter medicines, such as acetaminophen, to help you feel better.  Stay in touch with your doctor. Call before you get medical care. Be sure to get care if you have trouble breathing, or have any other emergency warning signs, or if you think it is an emergency.  Avoid public transportation, ride-sharing, or taxis. Separate yourself from other people and pets in your home.  As much as possible, stay in a specific room and away from other people and pets in your home. Also, you should use a separate bathroom, if available. If you need to be around other people or animals in or outside of the home, wear a mask. ? See COVID-19 and Animals if you have questions about pets:www.cdc.gov/coronavirus/2019-ncov/faq.html#COVID19animals. ? Additional guidance is available for those living in close quarters. (https://www.cdc.gov/coronavirus/2019-ncov/daily-life-coping/living-in-close-quarters.html) and shared housing (https://www.cdc.gov/coronavirus/2019-ncov/daily-life-coping/shared-housing/index.html). Monitor your symptoms.  Symptoms of COVID-19 include fever, cough, and shortness of breath but other symptoms may be present as well.  Follow care instructions from your healthcare provider and local health department. Your local health authorities will give instructions on checking your symptoms and reporting information. When to Seek Emergency Medical Attention Look for emergency warning signs* for  COVID-19. If someone is showing any of these signs, seek emergency medical care immediately:  Trouble breathing  Persistent pain or pressure in the chest  New confusion  Bluish lips or face  Inability to wake or stay awake *This list is not all possible symptoms. Please call your medical provider for any other symptoms that are severe or concerning to you. Call 911 or call ahead to your local emergency facility: Notify the operator that you are seeking care for someone who has or may have COVID-19. Call ahead before visiting your doctor.  Call ahead. Many medical visits for routine care are being postponed or done by phone or telemedicine.  If you have a medical appointment that cannot be postponed, call your doctor's office, and tell them you have or may have COVID-19. If you are sick, wear a mask over your nose and mouth.  You should wear a mask over your nose and mouth if you must be around other people or animals, including pets (even at home).  You don't need to wear the mask if you are alone. If you can't put on a mask (because of trouble breathing for example), cover your coughs and sneezes in some other way. Try to stay at least 6 feet away from other people. This will help protect the people around you.  Masks should not be placed on young children under age 2 years, anyone who has trouble breathing, or anyone who is not able to remove the mask without help. Note: During the COVID-19 pandemic, medical grade facemasks are reserved for healthcare workers and some first responders. You may need to make a mask using a scarf or bandana. Cover your coughs and sneezes.  Cover your mouth and nose with a tissue when you cough or sneeze.    Throw used tissues in a lined trash can.  Immediately wash your hands with soap and water for at least 20 seconds. If soap and water are not available, clean your hands with an alcohol-based hand sanitizer that contains at least 60% alcohol. Clean  your hands often.  Wash your hands often with soap and water for at least 20 seconds. This is especially important after blowing your nose, coughing, or sneezing; going to the bathroom; and before eating or preparing food.  Use hand sanitizer if soap and water are not available. Use an alcohol-based hand sanitizer with at least 60% alcohol, covering all surfaces of your hands and rubbing them together until they feel dry.  Soap and water are the best option, especially if your hands are visibly dirty.  Avoid touching your eyes, nose, and mouth with unwashed hands. Avoid sharing personal household items.  Do not share dishes, drinking glasses, cups, eating utensils, towels, or bedding with other people in your home.  Wash these items thoroughly after using them with soap and water or put them in the dishwasher. Clean all "high-touch" surfaces everyday.  Clean and disinfect high-touch surfaces in your "sick room" and bathroom. Let someone else clean and disinfect surfaces in common areas, but not your bedroom and bathroom.  If a caregiver or other person needs to clean and disinfect a sick person's bedroom or bathroom, they should do so on an as-needed basis. The caregiver/other person should wear a mask and wait as long as possible after the sick person has used the bathroom. High-touch surfaces include phones, remote controls, counters, tabletops, doorknobs, bathroom fixtures, toilets, keyboards, tablets, and bedside tables.  Clean and disinfect areas that may have blood, stool, or body fluids on them.  Use household cleaners and disinfectants. Clean the area or item with soap and water or another detergent if it is dirty. Then use a household disinfectant. ? Be sure to follow the instructions on the label to ensure safe and effective use of the product. Many products recommend keeping the surface wet for several minutes to ensure germs are killed. Many also recommend precautions such as  wearing gloves and making sure you have good ventilation during use of the product. ? Most EPA-registered household disinfectants should be effective. When you can be around others after you had or likely had COVID-19 When you can be around others (end home isolation) depends on different factors for different situations.  I think or know I had COVID-19, and I had symptoms ? You can be with others after  24 hours with no fever AND  Symptoms improved AND  10 days since symptoms first appeared ? Depending on your healthcare provider's advice and availability of testing, you might get tested to see if you still have COVID-19. If you will be tested, you can be around others when you have no fever, symptoms have improved, and you receive two negative test results in a row, at least 24 hours apart.  I tested positive for COVID-19 but had no symptoms ? If you continue to have no symptoms, you can be with others after:  10 days have passed since test ? Depending on your healthcare provider's advice and availability of testing, you might get tested to see if you still have COVID-19. If you will be tested, you can be around others after you receive two negative test results in a row, at least 24 hours apart. ? If you develop symptoms after testing positive, follow the guidance above for "  I think or know I had COVID, and I had symptoms." cdc.gov/coronavirus 05/22/2019 This information is not intended to replace advice given to you by your health care provider. Make sure you discuss any questions you have with your health care provider. Document Revised: 06/07/2019 Document Reviewed: 04/10/2019 Elsevier Patient Education  2020 Elsevier Inc.  

## 2020-06-12 ENCOUNTER — Telehealth: Payer: Self-pay | Admitting: Pharmacy Technician

## 2020-06-12 NOTE — Telephone Encounter (Signed)
Provided patient with new patient packet to obtain [facility] services.  [facility] must receive requested financial documentation within 30 days in order to determine eligibility and provide medication assistance.  Treyveon Mochizuki J. Cesar Alf Care Manager [facility]  

## 2020-06-19 ENCOUNTER — Telehealth: Payer: Self-pay | Admitting: Gerontology

## 2020-06-19 ENCOUNTER — Telehealth: Payer: Self-pay | Admitting: Pharmacy Technician

## 2020-06-19 NOTE — Telephone Encounter (Signed)
-----   Message from Leodis Sias sent at 06/19/2020  8:47 AM EDT ----- Regarding: FW: patient referral Letter of support and application form were mailed to pt on 9/8 ----- Message ----- From: Leodis Sias Sent: 06/17/2020   3:46 PM EDT To: Morton Amy Admin Subject: FW: patient referral                           Contacted pt by phone on 9/7. Need someone to call her back to see if the documents she has to become an [facility] pt would satisfy the eligibility requirements. She is not [place] speaking. ----- Message ----- From: Leda Min, CMA Sent: 06/04/2020  12:42 PM EDT To: Morton Amy Admin Subject: FW: patient referral                           Please contact patient by number in chart, not number listed below. I do not believe she is spanish speaking. Go over eligibility and necessary paperwork. ----- Message ----- From: Shelbie Hutching, RN Sent: 06/04/2020  10:02 AM EDT To: Josefine Class, CMA Subject: RE: patient referral                           I verified the patient's telephone number it is [phone].  Patient Spanish Speaking.   ----- Message ----- From: Jacquelynn Cree Sent: 06/02/2020   8:50 AM EDT To: Shelbie Hutching, RN Subject: RE: patient referral                           I will follow-up with the patient.  Thank you. ----- Message ----- From: Shelbie Hutching, RN Sent: 05/29/2020  12:57 PM EDT To: Josefine Class, CMA Subject: patient referral                               Patient admitted to the hospital with [place].  She has no PCP or insurance- she will probably be discharged with prescriptions in the next day or so.  Application given to patient to fill out.  Thanks Doran Clay RN BSN  Case Manager

## 2020-06-19 NOTE — Telephone Encounter (Signed)
-----   Message from Leodis Sias sent at 06/19/2020  8:47 AM EDT ----- Regarding: FW: patient referral Letter of support and application form were mailed to pt on 9/8 ----- Message ----- From: Leodis Sias Sent: 06/17/2020   3:46 PM EDT To: Morton Amy Admin Subject: FW: patient referral                           Contacted pt by phone on 9/7. Need someone to call her back to see if the documents she has to become an [facility] pt would satisfy the eligibility requirements. She is not [place] speaking. ----- Message ----- From: Leda Min, CMA Sent: 06/04/2020  12:42 PM EDT To: Morton Amy Admin Subject: FW: patient referral                           Please contact patient by number in chart, not number listed below. I do not believe she is spanish speaking. Go over eligibility and necessary paperwork. ----- Message ----- From: Shelbie Hutching, RN Sent: 06/04/2020  10:02 AM EDT To: Josefine Class, CMA Subject: RE: patient referral                           I verified the patient's telephone number it is [phone].  Patient Spanish Speaking.   ----- Message ----- From: Jacquelynn Cree Sent: 06/02/2020   8:50 AM EDT To: Shelbie Hutching, RN Subject: RE: patient referral                           I will follow-up with the patient.  Thank you. ----- Message ----- From: Shelbie Hutching, RN Sent: 05/29/2020  12:57 PM EDT To: Josefine Class, CMA Subject: patient referral                               Patient admitted to the hospital with [place].  She has no PCP or insurance- she will probably be discharged with prescriptions in the next day or so.  Application given to patient to fill out.  Thanks Doran Clay RN BSN  Case Manager

## 2020-06-19 NOTE — Telephone Encounter (Signed)
Provided patient with new patient packet to obtain [facility] services.  [facility] must receive requested financial documentation within 30 days in order to determine eligibility and provide medication assistance.  Evalina Tabak J. Tristan Proto Care Manager [facility]  

## 2020-07-10 ENCOUNTER — Encounter: Payer: Self-pay | Admitting: Gerontology

## 2020-07-10 ENCOUNTER — Ambulatory Visit: Payer: Self-pay | Admitting: Gerontology

## 2020-07-10 ENCOUNTER — Other Ambulatory Visit: Payer: Self-pay

## 2020-07-10 VITALS — BP 119/80 | HR 84 | Wt 225.8 lb

## 2020-07-10 DIAGNOSIS — Z09 Encounter for follow-up examination after completed treatment for conditions other than malignant neoplasm: Secondary | ICD-10-CM | POA: Insufficient documentation

## 2020-07-10 DIAGNOSIS — Z7689 Persons encountering health services in other specified circumstances: Secondary | ICD-10-CM | POA: Insufficient documentation

## 2020-07-10 DIAGNOSIS — E119 Type 2 diabetes mellitus without complications: Secondary | ICD-10-CM | POA: Insufficient documentation

## 2020-07-10 MED ORDER — BLOOD GLUCOSE MONITOR KIT: PACK | 0 refills | Status: AC

## 2020-07-10 MED ORDER — METFORMIN HCL ER 500 MG PO TB24: 500.0000 mg | ORAL_TABLET | Freq: Two times a day (BID) | ORAL | 1 refills | Status: DC

## 2020-07-10 MED ORDER — METFORMIN HCL 500 MG PO TABS: 500.0000 mg | ORAL_TABLET | Freq: Two times a day (BID) | ORAL | 2 refills | Status: DC

## 2020-07-10 NOTE — Progress Notes (Signed)
Patient ID: Lisa Howell, female   DOB: January 15, 1971, 49 y.o.   MRN: [id number]  Chief Complaint  Patient presents with  . Establish Care    find out if Diabetic    HPI Lisa Howell is a 49 y.o. female who presents to establish care and evaluation of her chronic conditions. She was discharged from [facility] hospital on 05/31/2020 where she was treated for COVID related Pneumonia. Currently, she states that she's doing well, states that her energy is low, denies shortness of breath, chest pain and palpitation. She also has Type 2 diabetes and her HgbA1c done on 05/21/2020 was 6.9%. She takes 500 mg Metformin bid and admits to experiencing GI symptoms. She checks her blood glucose daily and states that her fasting reading today was 119 mg/dl. She denies hypo/hyperglycemic symptoms, peripheral neuropathy and performs daily foot check. She reports that she has not had Colonoscopy, Mammogram nor Pap smear done. Overall, she states that she's doing well and offers no further complaint.   History reviewed. No pertinent past medical history.  Past Surgical History:  Procedure Laterality Date  . ABDOMINAL HYSTERECTOMY    . CESAREAN SECTION    . CHOLECYSTECTOMY      History reviewed. No pertinent family history.  Social History Social History   Tobacco Use  . Smoking status: Former Research scientist (life sciences)  . Smokeless tobacco: Never Used  Substance Use Topics  . Alcohol use: No  . Drug use: No    Allergies  Allergen Reactions  . Aspirin Other (See Comments)    Drowsy     Current Outpatient Medications  Medication Sig Dispense Refill  . blood glucose meter kit and supplies KIT Dispense based on patient and insurance preference. Use up to four times daily as directed. (FOR ICD-9 250.00, 250.01). 1 each 0  . blood glucose meter kit and supplies KIT Dispense based on patient and insurance preference. Use up to four times daily as directed. (FOR ICD-9 250.00, 250.01). 1 each 0  . metFORMIN (GLUCOPHAGE  XR) 500 MG 24 hr tablet Take 1 tablet (500 mg total) by mouth 2 (two) times daily. 60 tablet 1   No current facility-administered medications for this visit.    Review of Systems Review of Systems  Constitutional: Positive for fatigue.  HENT: Negative.   Eyes: Negative.   Respiratory: Negative.   Cardiovascular: Negative.   Gastrointestinal: Negative.   Endocrine: Negative.   Genitourinary: Negative.   Musculoskeletal: Negative.   Skin: Negative.   Neurological: Negative.   Hematological: Negative.   Psychiatric/Behavioral: Negative.     Blood pressure 119/80, pulse 84, weight 225 lb 12.8 oz (102.4 kg), SpO2 95 %.  Physical Exam Physical Exam HENT:     Head: Normocephalic and atraumatic.     Nose:     Comments: Deferred per Covid protocol    Mouth/Throat:     Comments: Deferred per Covid protocol Eyes:     Extraocular Movements: Extraocular movements intact.     Conjunctiva/sclera: Conjunctivae normal.     Pupils: Pupils are equal, round, and reactive to light.  Cardiovascular:     Rate and Rhythm: Normal rate and regular rhythm.     Pulses: Normal pulses.     Heart sounds: Normal heart sounds.  Pulmonary:     Effort: Pulmonary effort is normal.     Breath sounds: Normal breath sounds.  Abdominal:     General: Bowel sounds are normal.     Palpations: Abdomen is soft.  Genitourinary:  Comments: Deferred per patient Musculoskeletal:        General: Normal range of motion.     Cervical back: Normal range of motion.  Skin:    General: Skin is warm and dry.  Neurological:     General: No focal deficit present.     Mental Status: She is alert and oriented to person, place, and time. Mental status is at baseline.  Psychiatric:        Mood and Affect: Mood normal.        Behavior: Behavior normal.        Thought Content: Thought content normal.        Judgment: Judgment normal.     Data Reviewed Lab and past medical history was reviewed.  Assessment and  Plan  1. Encounter to establish care -Routine labs will be collected. - CBC w/Diff; Future - Basic Metabolic Panel (BMET); Future - Urinalysis; Future - Lipid panel; Future - Ambulatory referral to Hematology / Oncology for Mammogram and Pap smear - Ambulatory referral to Gastroenterology for Colonoscopy  2. Type 2 diabetes mellitus without complication, without long-term current use of insulin (HCC) - Her HgbA1c was 6.9%, her Metformin was changed to Extended release due to GI symptoms. She was advised to check blood glucose daily, record readings and bring log to follow up visit. She's to continue on Low carb/non concentrated sweet diet. - blood glucose meter kit and supplies KIT; Dispense based on patient and insurance preference. Use up to four times daily as directed. (FOR ICD-9 250.00, 250.01).  Dispense: 1 each; Refill: 0 - metFORMIN (GLUCOPHAGE XR) 500 MG 24 hr tablet; Take 1 tablet (500 mg total) by mouth 2 (two) times daily.  Dispense: 60 tablet; Refill: 1  3. Hospital discharge follow-up - She was advised to continue on hospital discharge instructions.       Follow up: 08/13/2020 if symptoms worsen or fail to improve.  Countess Biebel E Kayleen Alig 07/10/2020, 9:28 PM

## 2020-07-10 NOTE — Patient Instructions (Signed)
Carbohydrate Counting for Diabetes Mellitus, Adult  Carbohydrate counting is a method of keeping track of how many carbohydrates you eat. Eating carbohydrates naturally increases the amount of sugar (glucose) in the blood. Counting how many carbohydrates you eat helps keep your blood glucose within normal limits, which helps you manage your diabetes (diabetes mellitus). It is important to know how many carbohydrates you can safely have in each meal. This is different for every person. A diet and nutrition specialist (registered dietitian) can help you make a meal plan and calculate how many carbohydrates you should have at each meal and snack. Carbohydrates are found in the following foods:  Grains, such as breads and cereals.  Dried beans and soy products.  Starchy vegetables, such as potatoes, peas, and corn.  Fruit and fruit juices.  Milk and yogurt.  Sweets and snack foods, such as cake, cookies, candy, chips, and soft drinks. How do I count carbohydrates? There are two ways to count carbohydrates in food. You can use either of the methods or a combination of both. Reading "Nutrition Facts" on packaged food The "Nutrition Facts" list is included on the labels of almost all packaged foods and beverages in the [place] It includes:  The serving size.  Information about nutrients in each serving, including the grams (g) of carbohydrate per serving. To use the "Nutrition Facts":  Decide how many servings you will have.  Multiply the number of servings by the number of carbohydrates per serving.  The resulting number is the total amount of carbohydrates that you will be having. Learning standard serving sizes of other foods When you eat carbohydrate foods that are not packaged or do not include "Nutrition Facts" on the label, you need to measure the servings in order to count the amount of carbohydrates:  Measure the foods that you will eat with a food scale or measuring cup, if  needed.  Decide how many standard-size servings you will eat.  Multiply the number of servings by 15. Most carbohydrate-rich foods have about 15 g of carbohydrates per serving. ? For example, if you eat 8 oz (170 g) of strawberries, you will have eaten 2 servings and 30 g of carbohydrates (2 servings x 15 g = 30 g).  For foods that have more than one food mixed, such as soups and casseroles, you must count the carbohydrates in each food that is included. The following list contains standard serving sizes of common carbohydrate-rich foods. Each of these servings has about 15 g of carbohydrates:   hamburger bun or  English muffin.   oz (15 mL) syrup.   oz (14 g) jelly.  1 slice of bread.  1 six-inch tortilla.  3 oz (85 g) cooked rice or pasta.  4 oz (113 g) cooked dried beans.  4 oz (113 g) starchy vegetable, such as peas, corn, or potatoes.  4 oz (113 g) hot cereal.  4 oz (113 g) mashed potatoes or  of a large baked potato.  4 oz (113 g) canned or frozen fruit.  4 oz (120 mL) fruit juice.  4-6 crackers.  6 chicken nuggets.  6 oz (170 g) unsweetened dry cereal.  6 oz (170 g) plain fat-free yogurt or yogurt sweetened with artificial sweeteners.  8 oz (240 mL) milk.  8 oz (170 g) fresh fruit or one small piece of fruit.  24 oz (680 g) popped popcorn. Example of carbohydrate counting Sample meal  3 oz (85 g) chicken breast.  6 oz (170 g)   brown rice.  4 oz (113 g) corn.  8 oz (240 mL) milk.  8 oz (170 g) strawberries with sugar-free whipped topping. Carbohydrate calculation 1. Identify the foods that contain carbohydrates: ? Rice. ? Corn. ? Milk. ? Strawberries. 2. Calculate how many servings you have of each food: ? 2 servings rice. ? 1 serving corn. ? 1 serving milk. ? 1 serving strawberries. 3. Multiply each number of servings by 15 g: ? 2 servings rice x 15 g = 30 g. ? 1 serving corn x 15 g = 15 g. ? 1 serving milk x 15 g = 15 g. ? 1  serving strawberries x 15 g = 15 g. 4. Add together all of the amounts to find the total grams of carbohydrates eaten: ? 30 g + 15 g + 15 g + 15 g = 75 g of carbohydrates total. Summary  Carbohydrate counting is a method of keeping track of how many carbohydrates you eat.  Eating carbohydrates naturally increases the amount of sugar (glucose) in the blood.  Counting how many carbohydrates you eat helps keep your blood glucose within normal limits, which helps you manage your diabetes.  A diet and nutrition specialist (registered dietitian) can help you make a meal plan and calculate how many carbohydrates you should have at each meal and snack. This information is not intended to replace advice given to you by your health care provider. Make sure you discuss any questions you have with your health care provider. Document Revised: 04/21/2017 Document Reviewed: 03/10/2016 Elsevier Patient Education  2020 Elsevier Inc.  

## 2020-07-11 ENCOUNTER — Ambulatory Visit: Payer: Self-pay | Admitting: Pharmacy Technician

## 2020-07-11 DIAGNOSIS — Z79899 Other long term (current) drug therapy: Secondary | ICD-10-CM

## 2020-07-11 NOTE — Progress Notes (Signed)
Completed [facility] application and contract.  Patient agreed to all terms of the [facility] contract.    Patient approved to receive medication assistance at [facility] until time for re-certification in 4239, and as long as eligibility criteria continues to be met.   Provided patient with community resource material based on her particular needs.    [facility]

## 2020-07-22 ENCOUNTER — Other Ambulatory Visit: Payer: Self-pay

## 2020-07-23 ENCOUNTER — Telehealth: Payer: Self-pay | Admitting: Gerontology

## 2020-07-23 NOTE — Telephone Encounter (Signed)
Pt stated that they were running out of test strips, and kept getting error message readings on her glucometer. She will come in tomorrow (10/14) after 2 with her glucometer to discuss. - MD 10/13

## 2020-08-06 ENCOUNTER — Other Ambulatory Visit: Payer: Self-pay

## 2020-08-06 DIAGNOSIS — Z7689 Persons encountering health services in other specified circumstances: Secondary | ICD-10-CM

## 2020-08-06 DIAGNOSIS — E119 Type 2 diabetes mellitus without complications: Secondary | ICD-10-CM

## 2020-08-06 NOTE — Progress Notes (Signed)
a1c

## 2020-08-06 NOTE — Addendum Note (Signed)
Addended by: Fabiola Backer on: 08/06/2020 11:02 AM   Modules accepted: Orders

## 2020-08-07 LAB — BASIC METABOLIC PANEL
BUN/Creatinine Ratio: 9 (ref 9–23)
BUN: 6 mg/dL (ref 6–24)
CO2: 25 mmol/L (ref 20–29)
Calcium: 9.4 mg/dL (ref 8.7–10.2)
Chloride: 102 mmol/L (ref 96–106)
Creatinine, Ser: 0.66 mg/dL (ref 0.57–1.00)
GFR calc Af Amer: 118 mL/min/{1.73_m2} (ref >59)
GFR calc non Af Amer: 103 mL/min/{1.73_m2} (ref >59)
Glucose: 113 mg/dL — ABNORMAL HIGH (ref 65–99)
Potassium: 4.1 mmol/L (ref 3.5–5.2)
Sodium: 142 mmol/L (ref 134–144)

## 2020-08-07 LAB — LIPID PANEL
Chol/HDL Ratio: 5.9 ratio — ABNORMAL HIGH (ref 0.0–4.4)
Cholesterol, Total: 260 mg/dL — ABNORMAL HIGH (ref 100–199)
HDL: 44 mg/dL (ref >39)
LDL Chol Calc (NIH): 170 mg/dL — ABNORMAL HIGH (ref 0–99)
Triglycerides: 245 mg/dL — ABNORMAL HIGH (ref 0–149)
VLDL Cholesterol Cal: 46 mg/dL — ABNORMAL HIGH (ref 5–40)

## 2020-08-07 LAB — CBC WITH DIFFERENTIAL/PLATELET
Basophils Absolute: 0 10*3/uL (ref 0.0–0.2)
Basos: 0 %
EOS (ABSOLUTE): 0.1 10*3/uL (ref 0.0–0.4)
Eos: 2 %
Hematocrit: 38.5 % (ref 34.0–46.6)
Hemoglobin: 12.8 g/dL (ref 11.1–15.9)
Immature Grans (Abs): 0 10*3/uL (ref 0.0–0.1)
Immature Granulocytes: 0 %
Lymphocytes Absolute: 2.7 10*3/uL (ref 0.7–3.1)
Lymphs: 29 %
MCH: 27.4 pg (ref 26.6–33.0)
MCHC: 33.2 g/dL (ref 31.5–35.7)
MCV: 82 fL (ref 79–97)
Monocytes Absolute: 0.4 10*3/uL (ref 0.1–0.9)
Monocytes: 5 %
Neutrophils Absolute: 6.1 10*3/uL (ref 1.4–7.0)
Neutrophils: 64 %
Platelets: 374 10*3/uL (ref 150–450)
RBC: 4.68 x10E6/uL (ref 3.77–5.28)
RDW: 15.6 % — ABNORMAL HIGH (ref 11.7–15.4)
WBC: 9.4 10*3/uL (ref 3.4–10.8)

## 2020-08-07 LAB — HEMOGLOBIN A1C
Est. average glucose Bld gHb Est-mCnc: 114 mg/dL
Hgb A1c MFr Bld: 5.6 % (ref 4.8–5.6)

## 2020-08-07 LAB — URINALYSIS
Bilirubin, UA: NEGATIVE
Glucose, UA: NEGATIVE
Ketones, UA: NEGATIVE
Nitrite, UA: NEGATIVE
RBC, UA: NEGATIVE
Specific Gravity, UA: 1.018 (ref 1.005–1.030)
Urobilinogen, Ur: 0.2 mg/dL (ref 0.2–1.0)
pH, UA: 5 (ref 5.0–7.5)

## 2020-08-07 LAB — TSH: TSH: 1.22 u[IU]/mL (ref 0.450–4.500)

## 2020-08-13 ENCOUNTER — Ambulatory Visit: Payer: Self-pay | Admitting: Gerontology

## 2020-08-13 ENCOUNTER — Other Ambulatory Visit: Payer: Self-pay | Admitting: Gerontology

## 2020-08-13 ENCOUNTER — Encounter: Payer: Self-pay | Admitting: Gerontology

## 2020-08-13 ENCOUNTER — Other Ambulatory Visit: Payer: Self-pay

## 2020-08-13 VITALS — BP 125/83 | HR 87 | Temp 98.1°F | Ht 61.0 in | Wt 226.9 lb

## 2020-08-13 DIAGNOSIS — E785 Hyperlipidemia, unspecified: Secondary | ICD-10-CM

## 2020-08-13 DIAGNOSIS — R002 Palpitations: Secondary | ICD-10-CM | POA: Insufficient documentation

## 2020-08-13 DIAGNOSIS — E119 Type 2 diabetes mellitus without complications: Secondary | ICD-10-CM

## 2020-08-13 MED ORDER — METFORMIN HCL 500 MG PO TABS: 500.0000 mg | ORAL_TABLET | Freq: Every day | ORAL | 3 refills | Status: DC

## 2020-08-13 MED ORDER — PRAVASTATIN SODIUM 10 MG PO TABS: 10.0000 mg | ORAL_TABLET | Freq: Every day | ORAL | 0 refills | Status: DC

## 2020-08-13 NOTE — Progress Notes (Signed)
Pt is here for follow up. Pt is here to get results from labs. Pt has no complaints.

## 2020-08-13 NOTE — Progress Notes (Signed)
Established Patient Office Visit  Subjective:  Patient ID: Lisa Howell, female    DOB: 09/25/1971  Age: 49 y.o. MRN: [id number]  CC: No chief complaint on file.   HPI KERON KOFFMAN presents for DM2 follow up and lab review. Patient's lipid panel was elevated. Her total cholesterol was 260, Triglycerides 245, and LDL 170. Patient's A1c on 08/06/20 was 5.6%. It has decreased from 6.9% on 05/21/20. She reports medication compliance, decreasing her carb intake and increasing exercise. She states that her energy has been increasing daily. She reports that her sugar this morning was 157m/dL. She denies hypo/hyperglycemic symptoms and denies peripheral neuropathy. Patient reports intermittent "fluttering" feeling in her chest. States that it happens 2-3 times per day and is not happening right now. She denies pain or precipitating factors. Nothing makes it better or worse and she has tried no treatment at home. Denies shortness of breath, chest or arm pain, nausea, or syncope. Overall, patient states that she is doing well and offers no further compliant.  No past medical history on file.  Past Surgical History:  Procedure Laterality Date  . ABDOMINAL HYSTERECTOMY    . CESAREAN SECTION    . CHOLECYSTECTOMY      No family history on file.  Social History   Socioeconomic History  . Marital status: Divorced    Spouse name: Not on file  . Number of children: Not on file  . Years of education: Not on file  . Highest education level: Not on file  Occupational History  . Not on file  Tobacco Use  . Smoking status: Former SResearch scientist (life sciences) . Smokeless tobacco: Never Used  Substance and Sexual Activity  . Alcohol use: No  . Drug use: No  . Sexual activity: Not on file  Other Topics Concern  . Not on file  Social History Narrative  . Not on file   Social Determinants of Health   Financial Resource Strain: High Risk  . Difficulty of Paying Living Expenses: Very hard  Food  Insecurity: No Food Insecurity  . Worried About RCharity fundraiserin the Last Year: Never true  . Ran Out of Food in the Last Year: Never true  Transportation Needs: No Transportation Needs  . Lack of Transportation (Medical): No  . Lack of Transportation (Non-Medical): No  Physical Activity: Insufficiently Active  . Days of Exercise per Week: 7 days  . Minutes of Exercise per Session: 10 min  Stress: Stress Concern Present  . Feeling of Stress : Very much  Social Connections: Moderately Isolated  . Frequency of Communication with Friends and Family: More than three times a week  . Frequency of Social Gatherings with Friends and Family: More than three times a week  . Attends Religious Services: 1 to 4 times per year  . Active Member of Clubs or Organizations: No  . Attends CArchivistMeetings: Never  . Marital Status: Divorced  IHuman resources officerViolence: Not At Risk  . Fear of Current or Ex-Partner: No  . Emotionally Abused: No  . Physically Abused: No  . Sexually Abused: No    Outpatient Medications Prior to Visit  Medication Sig Dispense Refill  . blood glucose meter kit and supplies KIT Dispense based on patient and insurance preference. Use up to four times daily as directed. (FOR ICD-9 250.00, 250.01). 1 each 0  . blood glucose meter kit and supplies KIT Dispense based on patient and insurance preference. Use up to four times daily  as directed. (FOR ICD-9 250.00, 250.01). 1 each 0  . metFORMIN (GLUCOPHAGE XR) 500 MG 24 hr tablet Take 1 tablet (500 mg total) by mouth 2 (two) times daily. 60 tablet 1   No facility-administered medications prior to visit.    Allergies  Allergen Reactions  . Aspirin Other (See Comments)    Drowsy     ROS Review of Systems  Constitutional: Negative.   HENT: Negative.   Respiratory: Negative.   Cardiovascular: Negative.   Gastrointestinal: Negative.   Endocrine: Negative.   Musculoskeletal: Negative.   Neurological:  Negative.       Objective:    Physical Exam Constitutional:      Appearance: Normal appearance. She is obese.  Cardiovascular:     Rate and Rhythm: Normal rate and regular rhythm.     Heart sounds: Normal heart sounds.  Pulmonary:     Effort: Pulmonary effort is normal.     Breath sounds: Normal breath sounds.  Abdominal:     General: Bowel sounds are normal.     Palpations: Abdomen is soft.  Musculoskeletal:        General: Normal range of motion.  Skin:    General: Skin is warm.     Capillary Refill: Capillary refill takes less than 2 seconds.  Neurological:     General: No focal deficit present.     Mental Status: She is alert and oriented to person, place, and time.  Psychiatric:        Mood and Affect: Mood normal.     BP 125/83   Pulse 87   Temp 98.1 F (36.7 C)   Ht 5' 1"  (1.549 m)   Wt 226 lb 14.4 oz (102.9 kg)   SpO2 98%   BMI 42.87 kg/m  Wt Readings from Last 3 Encounters:  08/13/20 226 lb 14.4 oz (102.9 kg)  07/10/20 225 lb 12.8 oz (102.4 kg)  05/27/20 230 lb (104.3 kg)   Patient advised to lose weight. She is increasing exercise as tolerated and following an ADA diet.    Health Maintenance Due  Topic Date Due  . Hepatitis C Screening  Never done  . PNEUMOCOCCAL POLYSACCHARIDE VACCINE AGE 29-64 HIGH RISK  Never done  . OPHTHALMOLOGY EXAM  Never done  . URINE MICROALBUMIN  Never done  . COVID-19 Vaccine (1) Never done  . TETANUS/TDAP  Never done  . PAP SMEAR-Modifier  Never done  . MAMMOGRAM  Never done  . COLONOSCOPY  Never done  . INFLUENZA VACCINE  Never done    There are no preventive care reminders to display for this patient.  Lab Results  Component Value Date   TSH 1.220 08/06/2020   Lab Results  Component Value Date   WBC 9.4 08/06/2020   HGB 12.8 08/06/2020   HCT 38.5 08/06/2020   MCV 82 08/06/2020   PLT 374 08/06/2020   Lab Results  Component Value Date   NA 142 08/06/2020   K 4.1 08/06/2020   CO2 25 08/06/2020    GLUCOSE 113 (H) 08/06/2020   BUN 6 08/06/2020   CREATININE 0.66 08/06/2020   BILITOT 0.8 05/31/2020   ALKPHOS 59 05/31/2020   AST 28 05/31/2020   ALT 24 05/31/2020   PROT 6.5 05/31/2020   ALBUMIN 3.2 (L) 05/31/2020   CALCIUM 9.4 08/06/2020   ANIONGAP 12 05/31/2020   Lab Results  Component Value Date   CHOL 260 (H) 08/06/2020   Lab Results  Component Value Date   HDL 44  08/06/2020   Lab Results  Component Value Date   LDLCALC 170 (H) 08/06/2020   Lab Results  Component Value Date   TRIG 245 (H) 08/06/2020   Lab Results  Component Value Date   CHOLHDL 5.9 (H) 08/06/2020   Lab Results  Component Value Date   HGBA1C 5.6 08/06/2020      Assessment & Plan:   1. Type 2 diabetes mellitus without complication, without long-term current use of insulin (HCC) Her A1c is 5.6% which is below her goal of 5.7%. Continue low carb, low concentrated sugar diet. Decrease metformin to 1 559m tab daily with breakfast.  - metFORMIN (GLUCOPHAGE) 500 MG tablet; Take 1 tablet (500 mg total) by mouth daily with breakfast.  Dispense: 30 tablet; Refill: 3 - Urine Microalbumin w/creat. ratio  2. Elevated lipids ASCVD score 6.5%. Moderate statin initiation recommended to lower LDL. Start pravastatin as prescribed. Reviewed potential side effects, such as, muscle cramps and weakness with patient. - CK (Creatine Kinase) - pravastatin (PRAVACHOL) 10 MG tablet; Take 1 tablet (10 mg total) by mouth daily.  Dispense: 30 tablet; Refill: 0  3. Fluttering sensation of heart Call number provided to schedule outpatient EKG. Seek emergency care for any chest pain, shortness of breath or feelings of impending doom.  - EKG 12-Lead; Future   Follow-up: Return in about 1 month (around 09/12/2020), or if symptoms worsen or fail to improve.    JMarina Gravel Student-NP

## 2020-08-14 ENCOUNTER — Telehealth: Payer: Self-pay

## 2020-08-14 LAB — MICROALBUMIN / CREATININE URINE RATIO
Creatinine, Urine: 203.4 mg/dL
Microalb/Creat Ratio: 9 mg/g creat (ref 0–29)
Microalbumin, Urine: 18.5 ug/mL

## 2020-08-14 LAB — CK: Total CK: 64 U/L (ref 32–182)

## 2020-08-14 NOTE — Telephone Encounter (Signed)
Called patient to complete her PHQ 9.

## 2020-08-28 ENCOUNTER — Other Ambulatory Visit: Payer: Self-pay | Admitting: Gerontology

## 2020-08-28 DIAGNOSIS — E119 Type 2 diabetes mellitus without complications: Secondary | ICD-10-CM

## 2020-08-28 MED ORDER — METFORMIN HCL ER 500 MG PO TB24: 500.0000 mg | ORAL_TABLET | Freq: Every day | ORAL | 3 refills | Status: DC

## 2020-09-11 ENCOUNTER — Encounter: Payer: Self-pay | Admitting: Gerontology

## 2020-09-11 ENCOUNTER — Other Ambulatory Visit: Payer: Self-pay

## 2020-09-11 ENCOUNTER — Ambulatory Visit: Payer: Self-pay | Admitting: Gerontology

## 2020-09-11 VITALS — BP 124/83 | HR 86 | Temp 97.7°F | Resp 16 | Wt 225.5 lb

## 2020-09-11 DIAGNOSIS — R002 Palpitations: Secondary | ICD-10-CM

## 2020-09-11 DIAGNOSIS — E785 Hyperlipidemia, unspecified: Secondary | ICD-10-CM

## 2020-09-11 DIAGNOSIS — K029 Dental caries, unspecified: Secondary | ICD-10-CM | POA: Insufficient documentation

## 2020-09-11 DIAGNOSIS — Z0000 Encounter for general adult medical examination without abnormal findings: Secondary | ICD-10-CM

## 2020-09-11 MED ORDER — PRAVASTATIN SODIUM 10 MG PO TABS: 10.0000 mg | ORAL_TABLET | Freq: Every day | ORAL | 2 refills | Status: DC

## 2020-09-11 NOTE — Progress Notes (Signed)
Established Patient Office Visit  Subjective:  Patient ID: Lisa Howell, female    DOB: 05-05-1971  Age: 49 y.o. MRN: [id number]  CC: No chief complaint on file.   HPI [name] presents for follow up of elevated lipids and fluttering sensation of heart. She was started on 10 mg Pravastatin at her last visit, she states that she's compliant with her medications and denies myalgia nor muscle weakness. She denies any changes her the 'fluttering ' feeling in her heart and is yet to have EKG done. She continues to experience 3-4 episodes of fluttering sensation to her heart daily that resolves in less than 10 seconds. She denies chest pain, shortness of breath and dizziness. She also c/o tooth ache to her upper incisors and requests dental referral. Overall, she states that she's doing well and offers no further complaint.  No past medical history on file.  Past Surgical History:  Procedure Laterality Date   ABDOMINAL HYSTERECTOMY     CESAREAN SECTION     CHOLECYSTECTOMY      No family history on file.  Social History   Socioeconomic History   Marital status: Divorced    Spouse name: Not on file   Number of children: Not on file   Years of education: Not on file   Highest education level: Not on file  Occupational History   Not on file  Tobacco Use   Smoking status: Former Smoker   Smokeless tobacco: Never Used  Substance and Sexual Activity   Alcohol use: No   Drug use: No   Sexual activity: Not on file  Other Topics Concern   Not on file  Social History Narrative   Not on file   Social Determinants of Health   Financial Resource Strain: High Risk   Difficulty of Paying Living Expenses: Very hard  Food Insecurity: No Food Insecurity   Worried About Charity fundraiser in the Last Year: Never true   Ran Out of Food in the Last Year: Never true  Transportation Needs: No Transportation Needs   Lack of Transportation (Medical): No    Lack of Transportation (Non-Medical): No  Physical Activity: Insufficiently Active   Days of Exercise per Week: 7 days   Minutes of Exercise per Session: 10 min  Stress: Stress Concern Present   Feeling of Stress : Very much  Social Connections: Moderately Isolated   Frequency of Communication with Friends and Family: More than three times a week   Frequency of Social Gatherings with Friends and Family: More than three times a week   Attends Religious Services: 1 to 4 times per year   Active Member of [employer] or Organizations: No   Attends Archivist Meetings: Never   Marital Status: Divorced  Human resources officer Violence: Not At Risk   Fear of Current or Ex-Partner: No   Emotionally Abused: No   Physically Abused: No   Sexually Abused: No    Outpatient Medications Prior to Visit  Medication Sig Dispense Refill   metFORMIN (GLUCOPHAGE XR) 500 MG 24 hr tablet Take 1 tablet (500 mg total) by mouth daily with breakfast. 30 tablet 3   pravastatin (PRAVACHOL) 10 MG tablet Take 1 tablet (10 mg total) by mouth daily. 30 tablet 0   blood glucose meter kit and supplies KIT Dispense based on patient and insurance preference. Use up to four times daily as directed. (FOR ICD-9 250.00, 250.01). 1 each 0   blood glucose meter kit and  supplies KIT Dispense based on patient and insurance preference. Use up to four times daily as directed. (FOR ICD-9 250.00, 250.01). 1 each 0   No facility-administered medications prior to visit.    Allergies  Allergen Reactions   Aspirin Other (See Comments)    Drowsy     ROS Review of Systems  Constitutional: Negative.   HENT: Positive for dental problem.   Eyes: Negative.   Respiratory: Negative.   Cardiovascular: Negative.   Neurological: Negative.   Psychiatric/Behavioral: Negative.       Objective:    Physical Exam HENT:     Head: Normocephalic and atraumatic.     Mouth/Throat:     Dentition: Dental caries present.    Cardiovascular:     Rate and Rhythm: Normal rate and regular rhythm.     Pulses: Normal pulses.     Heart sounds: Normal heart sounds.  Pulmonary:     Effort: Pulmonary effort is normal.     Breath sounds: Normal breath sounds.  Neurological:     General: No focal deficit present.     Mental Status: She is alert and oriented to person, place, and time. Mental status is at baseline.  Psychiatric:        Mood and Affect: Mood normal.        Behavior: Behavior normal.        Thought Content: Thought content normal.        Judgment: Judgment normal.     BP 124/83 (BP Location: Left Arm, Patient Position: Sitting, Cuff Size: Large)    Pulse 86    Temp 97.7 F (36.5 C)    Resp 16    Wt 225 lb 8 oz (102.3 kg)    SpO2 95%    BMI 42.61 kg/m  Wt Readings from Last 3 Encounters:  09/11/20 225 lb 8 oz (102.3 kg)  08/13/20 226 lb 14.4 oz (102.9 kg)  07/10/20 225 lb 12.8 oz (102.4 kg)   She was encouraged to loose weight  Health Maintenance Due  Topic Date Due   Hepatitis C Screening  Never done   OPHTHALMOLOGY EXAM  Never done   COVID-19 Vaccine (1) Never done   TETANUS/TDAP  Never done   PAP SMEAR-Modifier  Never done   MAMMOGRAM  Never done   COLONOSCOPY  Never done    There are no preventive care reminders to display for this patient.  Lab Results  Component Value Date   TSH 1.220 08/06/2020   Lab Results  Component Value Date   WBC 9.4 08/06/2020   HGB 12.8 08/06/2020   HCT 38.5 08/06/2020   MCV 82 08/06/2020   PLT 374 08/06/2020   Lab Results  Component Value Date   NA 142 08/06/2020   K 4.1 08/06/2020   CO2 25 08/06/2020   GLUCOSE 113 (H) 08/06/2020   BUN 6 08/06/2020   CREATININE 0.66 08/06/2020   BILITOT 0.8 05/31/2020   ALKPHOS 59 05/31/2020   AST 28 05/31/2020   ALT 24 05/31/2020   PROT 6.5 05/31/2020   ALBUMIN 3.2 (L) 05/31/2020   CALCIUM 9.4 08/06/2020   ANIONGAP 12 05/31/2020   Lab Results  Component Value Date   CHOL 260 (H)  08/06/2020   Lab Results  Component Value Date   HDL 44 08/06/2020   Lab Results  Component Value Date   LDLCALC 170 (H) 08/06/2020   Lab Results  Component Value Date   TRIG 245 (H) 08/06/2020   Lab Results  Component Value Date   CHOLHDL 5.9 (H) 08/06/2020   Lab Results  Component Value Date   HGBA1C 5.6 08/06/2020      Assessment & Plan:    1. Elevated lipids - She will continue on current treatment regimen, low fat/cholesterol and exercise as tolerated. - pravastatin (PRAVACHOL) 10 MG tablet; Take 1 tablet (10 mg total) by mouth daily.  Dispense: 30 tablet; Refill: 2 - Lipid panel; Future  2. Fluttering sensation of heart - She was encouraged to schedule her EKG and advised to go to the ED for chest pain or worsening symptoms.  3. Health care maintenance - Her [facility] financial application was approved and she was encouraged to schedule her Colonoscopy screening. - Ambulatory referral to Hematology / Oncology for Mammogram and Pap smear - Ambulatory referral to Ophthalmology - She declines Influenza and Pneumonia vaccine.  4. Dental decay - She was provided with Dental application for Dental referral.    Follow-up: Return in about 2 months (around 11/12/2020), or if symptoms worsen or fail to improve.    Julian Medina Jerold Coombe, NP

## 2020-09-11 NOTE — Patient Instructions (Signed)
Fat and Cholesterol Restricted Eating Plan Getting too much fat and cholesterol in your diet may cause health problems. Choosing the right foods helps keep your fat and cholesterol at normal levels. This can keep you from getting certain diseases. Your doctor may recommend an eating plan that includes:  Total fat: ______% or less of total calories a day.  Saturated fat: ______% or less of total calories a day.  Cholesterol: less than _________mg a day.  Fiber: ______g a day. What are tips for following this plan? Meal planning  At meals, divide your plate into four equal parts: ? Fill one-half of your plate with vegetables and green salads. ? Fill one-fourth of your plate with whole grains. ? Fill one-fourth of your plate with low-fat (lean) protein foods.  Eat fish that is high in omega-3 fats at least two times a week. This includes mackerel, tuna, sardines, and salmon.  Eat foods that are high in fiber, such as whole grains, beans, apples, broccoli, carrots, peas, and barley. General tips   Work with your doctor to lose weight if you need to.  Avoid: ? Foods with added sugar. ? Fried foods. ? Foods with partially hydrogenated oils.  Limit alcohol intake to no more than 1 drink a day for nonpregnant women and 2 drinks a day for men. One drink equals 12 oz of beer, 5 oz of wine, or 1 oz of hard liquor. Reading food labels  Check food labels for: ? Trans fats. ? Partially hydrogenated oils. ? Saturated fat (g) in each serving. ? Cholesterol (mg) in each serving. ? Fiber (g) in each serving.  Choose foods with healthy fats, such as: ? Monounsaturated fats. ? Polyunsaturated fats. ? Omega-3 fats.  Choose grain products that have whole grains. Look for the word "whole" as the first word in the ingredient list. Cooking  Cook foods using low-fat methods. These include baking, boiling, grilling, and broiling.  Eat more home-cooked foods. Eat at restaurants and buffets  less often.  Avoid cooking using saturated fats, such as butter, cream, palm oil, palm kernel oil, and coconut oil. Recommended foods  Fruits  All fresh, canned (in natural juice), or frozen fruits. Vegetables  Fresh or frozen vegetables (raw, steamed, roasted, or grilled). Green salads. Grains  Whole grains, such as whole wheat or whole grain breads, crackers, cereals, and pasta. Unsweetened oatmeal, bulgur, barley, quinoa, or brown rice. Corn or whole wheat flour tortillas. Meats and other protein foods  Ground beef (85% or leaner), grass-fed beef, or beef trimmed of fat. Skinless chicken or turkey. Ground chicken or turkey. Pork trimmed of fat. All fish and seafood. Egg whites. Dried beans, peas, or lentils. Unsalted nuts or seeds. Unsalted canned beans. Nut butters without added sugar or oil. Dairy  Low-fat or nonfat dairy products, such as skim or 1% milk, 2% or reduced-fat cheeses, low-fat and fat-free ricotta or cottage cheese, or plain low-fat and nonfat yogurt. Fats and oils  Tub margarine without trans fats. Light or reduced-fat mayonnaise and salad dressings. Avocado. Olive, canola, sesame, or safflower oils. The items listed above may not be a complete list of foods and beverages you can eat. Contact a dietitian for more information. Foods to avoid Fruits  Canned fruit in heavy syrup. Fruit in cream or butter sauce. Fried fruit. Vegetables  Vegetables cooked in cheese, cream, or butter sauce. Fried vegetables. Grains  White bread. White pasta. White rice. Cornbread. Bagels, pastries, and croissants. Crackers and snack foods that contain trans fat   and hydrogenated oils. Meats and other protein foods  Fatty cuts of meat. Ribs, chicken wings, bacon, sausage, bologna, salami, chitterlings, fatback, hot dogs, bratwurst, and packaged lunch meats. Liver and organ meats. Whole eggs and egg yolks. Chicken and turkey with skin. Fried meat. Dairy  Whole or 2% milk, cream,  half-and-half, and cream cheese. Whole milk cheeses. Whole-fat or sweetened yogurt. Full-fat cheeses. Nondairy creamers and whipped toppings. Processed cheese, cheese spreads, and cheese curds. Beverages  Alcohol. Sugar-sweetened drinks such as sodas, lemonade, and fruit drinks. Fats and oils  Butter, stick margarine, lard, shortening, ghee, or bacon fat. Coconut, palm kernel, and palm oils. Sweets and desserts  Corn syrup, sugars, honey, and molasses. Candy. Jam and jelly. Syrup. Sweetened cereals. Cookies, pies, cakes, donuts, muffins, and ice cream. The items listed above may not be a complete list of foods and beverages you should avoid. Contact a dietitian for more information. Summary  Choosing the right foods helps keep your fat and cholesterol at normal levels. This can keep you from getting certain diseases.  At meals, fill one-half of your plate with vegetables and green salads.  Eat high-fiber foods, like whole grains, beans, apples, carrots, peas, and barley.  Limit added sugar, saturated fats, alcohol, and fried foods. This information is not intended to replace advice given to you by your health care provider. Make sure you discuss any questions you have with your health care provider. Document Revised: 05/31/2018 Document Reviewed: 06/14/2017 Elsevier Patient Education  2020 Elsevier Inc.  

## 2020-11-06 ENCOUNTER — Other Ambulatory Visit: Payer: Self-pay

## 2020-11-12 ENCOUNTER — Other Ambulatory Visit: Payer: Self-pay

## 2020-11-12 ENCOUNTER — Ambulatory Visit: Payer: Self-pay | Admitting: Gerontology

## 2020-11-12 DIAGNOSIS — E785 Hyperlipidemia, unspecified: Secondary | ICD-10-CM

## 2020-11-13 LAB — LIPID PANEL
Chol/HDL Ratio: 6.3 ratio — ABNORMAL HIGH (ref 0.0–4.4)
Cholesterol, Total: 263 mg/dL — ABNORMAL HIGH (ref 100–199)
HDL: 42 mg/dL (ref >39)
LDL Chol Calc (NIH): 184 mg/dL — ABNORMAL HIGH (ref 0–99)
Triglycerides: 195 mg/dL — ABNORMAL HIGH (ref 0–149)
VLDL Cholesterol Cal: 37 mg/dL (ref 5–40)

## 2020-11-13 NOTE — Progress Notes (Addendum)
Medication Management Clinic Visit Note  Patient: Lisa Howell MRN: [id number] Date of Birth: 02-15-1971 PCP: [facility]   Lisa Howell 50 y.o. female presents for a telephone visit for medication management today. Verified patient with two identifiers.   There were no vitals taken for this visit.  Patient Information   Past Medical History:  Diagnosis Date  . Diabetes mellitus without complication ([place])   . Hyperlipidemia       Past Surgical History:  Procedure Laterality Date  . ABDOMINAL HYSTERECTOMY    . CESAREAN SECTION    . CHOLECYSTECTOMY       Family History  Problem Relation Age of Onset  . Diabetes Mother   . Heart disease Mother   . Cancer Mother   . Diabetes Father   . Cancer Father   . Diabetes Sister   . Diabetes Brother     New Diagnoses (since last visit): N/A  Family Support: Good  Lifestyle Diet: Breakfast: skips, not hungry Lunch: sandwich, salad Dinner: steak and cheese sub Drinks: water     Current Exercise Habits: The patient has a physically strenuous job, but has no regular exercise apart from work.       Social History   Substance and Sexual Activity  Alcohol Use No      Social History   Tobacco Use  Smoking Status Former Smoker  Smokeless Tobacco Never Used      Health Maintenance  Topic Date Due  . Hepatitis C Screening  Never done  . COVID-19 Vaccine (1) Never done  . OPHTHALMOLOGY EXAM  Never done  . TETANUS/TDAP  Never done  . PAP SMEAR-Modifier  Never done  . COLONOSCOPY (Pts 45-11yr Insurance coverage will need to be confirmed)  Never done  . MAMMOGRAM  Never done  . INFLUENZA VACCINE  01/08/2021 (Originally 05/11/2020)  . PNEUMOCOCCAL POLYSACCHARIDE VACCINE AGE 75-64 HIGH RISK  09/11/2021 (Originally 11/06/1972)  . HEMOGLOBIN A1C  02/04/2021  . FOOT EXAM  08/13/2021  . URINE MICROALBUMIN  08/13/2021  . HIV Screening  Completed   Health Maintenance/Date Completed  Last ED  visit: 05/27/20 Last Visit to PCP: 09/11/20 Next Visit to PCP: 11/18/20 Dental Exam: ~2 years ago; pt on cancellation list for appt and told to call March 1st if she has not heard from them by then Eye Exam: 11/14/20 @ 11am Pelvic/PAP Exam: more than 5 years ago Mammogram: more than 5 years ago Colonoscopy: "been a while" Flu Vaccine: never done Pneumonia Vaccine: never done COVID-19 Vaccine: never done; had COVID Shingrix Vaccine: never done  Outpatient Encounter Medications as of 11/14/2020  Medication Sig  . blood glucose meter kit and supplies KIT Dispense based on patient and insurance preference. Use up to four times daily as directed. (FOR ICD-9 250.00, 250.01).  . blood glucose meter kit and supplies KIT Dispense based on patient and insurance preference. Use up to four times daily as directed. (FOR ICD-9 250.00, 250.01).  . metFORMIN (GLUCOPHAGE XR) 500 MG 24 hr tablet Take 1 tablet (500 mg total) by mouth daily with breakfast.  . pravastatin (PRAVACHOL) 20 MG tablet Take 10 mg by mouth daily.  . [DISCONTINUED] pravastatin (PRAVACHOL) 10 MG tablet Take 1 tablet (10 mg total) by mouth daily.   No facility-administered encounter medications on file as of 11/14/2020.     Assessment and Plan: Diabetes Pt currently takes metformin and her last A1c was 5.6% on 08/06/20. Her metformin dose was decreased to once daily based  on her A1c. Pt checks her BG every evening and reports values between 115-120s. Pt denies symptoms or episodes of hypoglycemia. Encouraged pt to incorporate exercise into her daily routine with a goal of 150 min per week divided amongst 3-5 days. Continue low carb, low concentrated sugar diet and current medication regimen.   HLD Pt currently takes pravastatin 35m daily (low intensity) and last lipid panel was on 11/12/20: TC 263, TG 195, LDL 184. Pt's diabetes is well controlled, however lipid panel is elevated. Would recommended increasing to moderate statin intensity - will  discuss with Dr. EBenjamine Mola2/8/22 (pt's next appointment). Advised pt to follow low fat/cholesterol diet.   Access/Adherence Pt endorses adherence to her medications as she leaves her bottles out on the coffee table in plain sight. Pt did not need refills at this time.    SSherilyn Banker PharmD Pharmacy Resident  11/14/2020 9:27 AM

## 2020-11-14 ENCOUNTER — Other Ambulatory Visit: Payer: Self-pay

## 2020-11-14 ENCOUNTER — Ambulatory Visit: Payer: Self-pay | Admitting: Pharmacist

## 2020-11-14 DIAGNOSIS — Z79899 Other long term (current) drug therapy: Secondary | ICD-10-CM

## 2020-11-14 LAB — HM DIABETES EYE EXAM

## 2020-11-18 ENCOUNTER — Other Ambulatory Visit: Payer: Self-pay | Admitting: Gerontology

## 2020-11-18 ENCOUNTER — Ambulatory Visit: Payer: Self-pay | Admitting: Gerontology

## 2020-11-18 ENCOUNTER — Other Ambulatory Visit: Payer: Self-pay

## 2020-11-18 VITALS — BP 118/84 | HR 87 | Resp 16 | Wt 224.0 lb

## 2020-11-18 DIAGNOSIS — E119 Type 2 diabetes mellitus without complications: Secondary | ICD-10-CM

## 2020-11-18 DIAGNOSIS — E785 Hyperlipidemia, unspecified: Secondary | ICD-10-CM

## 2020-11-18 DIAGNOSIS — R002 Palpitations: Secondary | ICD-10-CM

## 2020-11-18 DIAGNOSIS — Z0000 Encounter for general adult medical examination without abnormal findings: Secondary | ICD-10-CM

## 2020-11-18 MED ORDER — PRAVASTATIN SODIUM 20 MG PO TABS: 20.0000 mg | ORAL_TABLET | Freq: Every day | ORAL | 3 refills | Status: DC

## 2020-11-18 MED ORDER — METFORMIN HCL ER 500 MG PO TB24: 500.0000 mg | ORAL_TABLET | Freq: Every day | ORAL | 3 refills | Status: DC

## 2020-11-18 NOTE — Patient Instructions (Signed)

## 2020-11-18 NOTE — Progress Notes (Signed)
Established Patient Office Visit  Subjective:  Patient ID: Lisa Howell, female    DOB: 1971/07/18  Age: 50 y.o. MRN: [id number]  CC: No chief complaint on file.   HPI Lisa Howell presents for follow up of elevated lipids and lab review. She states that she's compliant with her medications, denies side effects and continues to work on making healthy lifestyle changes. Her total cholesterol increased from 260-263 mg/dl,her Triglyceride decreased from 245-195 mg/dl, LDL increased from 170 -184 mg/dl. She states that the fluttering sensation in her heart is not as bad as it's used to be, her last episode was yesterday and she has not gone for her EKG. She had her eye exam done on 11/14/20 by Dr Waldemar Dickens. She's yet to follow up for her Colonoscopy, Mammogram and Pap smear. She denies chest pain, palpitation, dizziness and myalgia. Overall, she states that she's doing well and offers no further complaint.     Past Medical History:  Diagnosis Date  . Diabetes mellitus without complication ([place])   . Hyperlipidemia     Past Surgical History:  Procedure Laterality Date  . ABDOMINAL HYSTERECTOMY    . CESAREAN SECTION    . CHOLECYSTECTOMY      Family History  Problem Relation Age of Onset  . Diabetes Mother   . Heart disease Mother   . Cancer Mother   . Diabetes Father   . Cancer Father   . Diabetes Sister   . Diabetes Brother     Social History   Socioeconomic History  . Marital status: Divorced    Spouse name: Not on file  . Number of children: Not on file  . Years of education: Not on file  . Highest education level: Not on file  Occupational History  . Not on file  Tobacco Use  . Smoking status: Former Research scientist (life sciences)  . Smokeless tobacco: Never Used  Vaping Use  . Vaping Use: Never used  Substance and Sexual Activity  . Alcohol use: No  . Drug use: No  . Sexual activity: Not on file  Other Topics Concern  . Not on file  Social History Narrative  . Not on file    Social Determinants of Health   Financial Resource Strain: High Risk  . Difficulty of Paying Living Expenses: Very hard  Food Insecurity: No Food Insecurity  . Worried About Charity fundraiser in the Last Year: Never true  . Ran Out of Food in the Last Year: Never true  Transportation Needs: No Transportation Needs  . Lack of Transportation (Medical): No  . Lack of Transportation (Non-Medical): No  Physical Activity: Insufficiently Active  . Days of Exercise per Week: 7 days  . Minutes of Exercise per Session: 10 min  Stress: Stress Concern Present  . Feeling of Stress : Very much  Social Connections: Moderately Isolated  . Frequency of Communication with Friends and Family: More than three times a week  . Frequency of Social Gatherings with Friends and Family: More than three times a week  . Attends Religious Services: 1 to 4 times per year  . Active Member of Clubs or Organizations: No  . Attends Archivist Meetings: Never  . Marital Status: Divorced  Human resources officer Violence: Not At Risk  . Fear of Current or Ex-Partner: No  . Emotionally Abused: No  . Physically Abused: No  . Sexually Abused: No    Outpatient Medications Prior to Visit  Medication Sig Dispense Refill  . metFORMIN (  GLUCOPHAGE XR) 500 MG 24 hr tablet Take 1 tablet (500 mg total) by mouth daily with breakfast. 30 tablet 3  . pravastatin (PRAVACHOL) 20 MG tablet Take 10 mg by mouth daily.    . blood glucose meter kit and supplies KIT Dispense based on patient and insurance preference. Use up to four times daily as directed. (FOR ICD-9 250.00, 250.01). 1 each 0  . blood glucose meter kit and supplies KIT Dispense based on patient and insurance preference. Use up to four times daily as directed. (FOR ICD-9 250.00, 250.01). 1 each 0   No facility-administered medications prior to visit.    Allergies  Allergen Reactions  . Aspirin Other (See Comments)    Drowsy     ROS Review of Systems   Constitutional: Negative.   Eyes: Negative.   Respiratory: Negative.   Neurological: Negative.   Psychiatric/Behavioral: Negative.       Objective:    Physical Exam HENT:     Head: Normocephalic and atraumatic.  Cardiovascular:     Rate and Rhythm: Normal rate and regular rhythm.     Pulses: Normal pulses.     Heart sounds: Normal heart sounds.  Pulmonary:     Effort: Pulmonary effort is normal.     Breath sounds: Normal breath sounds.  Skin:    General: Skin is warm.  Neurological:     General: No focal deficit present.     Mental Status: She is alert and oriented to person, place, and time. Mental status is at baseline.  Psychiatric:        Mood and Affect: Mood normal.        Behavior: Behavior normal.        Thought Content: Thought content normal.        Judgment: Judgment normal.     BP 118/84 (BP Location: Left Arm, Patient Position: Sitting, Cuff Size: Large)   Pulse 87   Resp 16   Wt 224 lb (101.6 kg)   SpO2 97%   BMI 40.97 kg/m  Wt Readings from Last 3 Encounters:  11/18/20 224 lb (101.6 kg)  11/12/20 223 lb (101.2 kg)  09/11/20 225 lb 8 oz (102.3 kg)   Encouraged weight loss  Health Maintenance Due  Topic Date Due  . Hepatitis C Screening  Never done  . COVID-19 Vaccine (1) Never done  . TETANUS/TDAP  Never done  . PAP SMEAR-Modifier  Never done  . COLONOSCOPY (Pts 45-48yr Insurance coverage will need to be confirmed)  Never done  . MAMMOGRAM  Never done    There are no preventive care reminders to display for this patient.  Lab Results  Component Value Date   TSH 1.220 08/06/2020   Lab Results  Component Value Date   WBC 9.4 08/06/2020   HGB 12.8 08/06/2020   HCT 38.5 08/06/2020   MCV 82 08/06/2020   PLT 374 08/06/2020   Lab Results  Component Value Date   NA 142 08/06/2020   K 4.1 08/06/2020   CO2 25 08/06/2020   GLUCOSE 113 (H) 08/06/2020   BUN 6 08/06/2020   CREATININE 0.66 08/06/2020   BILITOT 0.8 05/31/2020   ALKPHOS  59 05/31/2020   AST 28 05/31/2020   ALT 24 05/31/2020   PROT 6.5 05/31/2020   ALBUMIN 3.2 (L) 05/31/2020   CALCIUM 9.4 08/06/2020   ANIONGAP 12 05/31/2020   Lab Results  Component Value Date   CHOL 263 (H) 11/12/2020   Lab Results  Component Value Date  HDL 42 11/12/2020   Lab Results  Component Value Date   LDLCALC 184 (H) 11/12/2020   Lab Results  Component Value Date   TRIG 195 (H) 11/12/2020   Lab Results  Component Value Date   CHOLHDL 6.3 (H) 11/12/2020   Lab Results  Component Value Date   HGBA1C 5.6 08/06/2020      Assessment & Plan:    1. Type 2 diabetes mellitus without complication, without long-term current use of insulin ([place]) - She will continue on current treatment regimen, will recheck HgbA1c. - metFORMIN (GLUCOPHAGE XR) 500 MG 24 hr tablet; Take 1 tablet (500 mg total) by mouth daily with breakfast.  Dispense: 30 tablet; Refill: 3 - HgB A1c; Future  2. Elevated lipids -The 10-year ASCVD risk score Mikey Bussing DC Jr., et al., 2013) is: 4.6%   Values used to calculate the score:     Age: 41 years     Sex: Female     Is Non-Hispanic African American: No     Diabetic: Yes     Tobacco smoker: No     Systolic Blood Pressure: 809 mmHg     Is BP treated: No     HDL Cholesterol: 42 mg/dL     Total Cholesterol: 263 mg/dL  - Her LDL was elevated and her ASCVD risk was 4.6%. Pravastatin was increased to 20 mg, advised to continue on low at/cholesterol diet and exercise as tolerated. - pravastatin (PRAVACHOL) 20 MG tablet; Take 1 tablet (20 mg total) by mouth daily.  Dispense: 30 tablet; Refill: 3 - Lipid panel; Future  3. Fluttering sensation of heart - She was encouraged to have EKG done. Was advised to go to the ED for worsening symptoms. - EKG 12-Lead  4. Health care maintenance - Her [facility] financial assistance is active, she was encouraged to schedule Colonoscopy, Mammogram and Pap smear. - Ambulatory referral to Hematology / Oncology - Ambulatory  referral to Gastroenterology - TSH; Future    Follow-up: Return in about 4 months (around 03/05/2021), or if symptoms worsen or fail to improve.    Chioma Jerold Coombe, NP

## 2020-11-20 ENCOUNTER — Ambulatory Visit: Payer: Self-pay | Admitting: Gerontology

## 2020-11-20 DIAGNOSIS — R002 Palpitations: Secondary | ICD-10-CM

## 2020-11-27 ENCOUNTER — Other Ambulatory Visit: Payer: Self-pay

## 2020-11-27 ENCOUNTER — Ambulatory Visit
Admission: RE | Admit: 2020-11-27 | Discharge: 2020-11-27 | Disposition: A | Discharge status: Home / Self Care | Payer: Self-pay | Source: Ambulatory Visit | Attending: Gerontology | Admitting: Gerontology

## 2020-11-27 DIAGNOSIS — R002 Palpitations: Secondary | ICD-10-CM | POA: Insufficient documentation

## 2020-11-27 DIAGNOSIS — Z8616 Personal history of COVID-19: Secondary | ICD-10-CM | POA: Diagnosis not present

## 2020-11-27 DIAGNOSIS — E785 Hyperlipidemia, unspecified: Secondary | ICD-10-CM | POA: Insufficient documentation

## 2020-12-16 ENCOUNTER — Other Ambulatory Visit: Payer: Self-pay | Admitting: Gerontology

## 2020-12-17 ENCOUNTER — Telehealth (INDEPENDENT_AMBULATORY_CARE_PROVIDER_SITE_OTHER): Payer: Self-pay | Admitting: Gastroenterology

## 2020-12-17 ENCOUNTER — Other Ambulatory Visit: Payer: Self-pay

## 2020-12-17 DIAGNOSIS — E669 Obesity, unspecified: Secondary | ICD-10-CM | POA: Insufficient documentation

## 2020-12-17 DIAGNOSIS — Z1211 Encounter for screening for malignant neoplasm of colon: Secondary | ICD-10-CM

## 2020-12-17 MED ORDER — PEG 3350-KCL-NA BICARB-NACL 420 G PO SOLR: 4000.0000 mL | Freq: Once | ORAL | 0 refills | Status: DC

## 2020-12-17 NOTE — Progress Notes (Signed)
Gastroenterology Pre-Procedure Review  Request Date: Tuesday 12/31/20 Requesting Physician: Dr. Allen Norris   PATIENT REVIEW QUESTIONS: The patient responded to the following health history questions as indicated:    1. Are you having any GI issues? no 2. Do you have a personal history of Polyps? no 3. Do you have a family history of Colon Cancer or Polyps? yes (father and uncle colon cancer) 4. Diabetes Mellitus? yes (type 2) 5. Joint replacements in the past 12 months?no 6. Major health problems in the past 3 months?no 7. Any artificial heart valves, MVP, or defibrillator?no    MEDICATIONS & ALLERGIES:    Patient reports the following regarding taking any anticoagulation/antiplatelet therapy:   Plavix, Coumadin, Eliquis, Xarelto, Lovenox, Pradaxa, Brilinta, or Effient? no Aspirin? no  Patient confirms/reports the following medications:  Current Outpatient Medications  Medication Sig Dispense Refill  . blood glucose meter kit and supplies KIT Dispense based on patient and insurance preference. Use up to four times daily as directed. (FOR ICD-9 250.00, 250.01). 1 each 0  . blood glucose meter kit and supplies KIT Dispense based on patient and insurance preference. Use up to four times daily as directed. (FOR ICD-9 250.00, 250.01). 1 each 0  . metFORMIN (GLUCOPHAGE XR) 500 MG 24 hr tablet Take 1 tablet (500 mg total) by mouth daily with breakfast. 30 tablet 3  . pravastatin (PRAVACHOL) 20 MG tablet Take 1 tablet (20 mg total) by mouth daily. 30 tablet 3   No current facility-administered medications for this visit.    Patient confirms/reports the following allergies:  Allergies  Allergen Reactions  . Aspirin Other (See Comments)    Drowsy     No orders of the defined types were placed in this encounter.   AUTHORIZATION INFORMATION Primary Insurance: 1D#: Group #:  Secondary Insurance: 1D#: Group #:  SCHEDULE INFORMATION: Date: Tuesday 12/30/20 Time: Location:[facility]

## 2020-12-18 ENCOUNTER — Other Ambulatory Visit: Payer: Self-pay

## 2020-12-26 ENCOUNTER — Other Ambulatory Visit
Admission: RE | Admit: 2020-12-26 | Discharge: 2020-12-26 | Disposition: A | Discharge status: Home / Self Care | Payer: Self-pay | Source: Ambulatory Visit | Attending: Gastroenterology | Admitting: Gastroenterology

## 2020-12-26 ENCOUNTER — Other Ambulatory Visit: Payer: Self-pay

## 2020-12-26 DIAGNOSIS — Z20822 Contact with and (suspected) exposure to covid-19: Secondary | ICD-10-CM | POA: Insufficient documentation

## 2020-12-26 DIAGNOSIS — Z01812 Encounter for preprocedural laboratory examination: Secondary | ICD-10-CM | POA: Insufficient documentation

## 2020-12-26 LAB — SARS CORONAVIRUS 2 (TAT 6-24 HRS): SARS Coronavirus 2: NEGATIVE

## 2020-12-30 ENCOUNTER — Ambulatory Visit
Admission: RE | Admit: 2020-12-30 | Discharge: 2020-12-30 | Disposition: A | Discharge status: Home / Self Care | Payer: Self-pay | Attending: Gastroenterology | Admitting: Gastroenterology

## 2020-12-30 ENCOUNTER — Ambulatory Visit: Payer: Self-pay | Admitting: Anesthesiology

## 2020-12-30 ENCOUNTER — Other Ambulatory Visit: Payer: Self-pay

## 2020-12-30 ENCOUNTER — Encounter: Payer: Self-pay | Admitting: Gastroenterology

## 2020-12-30 ENCOUNTER — Encounter
Admission: RE | Disposition: A | Discharge status: Home / Self Care | Payer: Self-pay | Source: Home / Self Care | Attending: Gastroenterology

## 2020-12-30 DIAGNOSIS — D122 Benign neoplasm of ascending colon: Secondary | ICD-10-CM | POA: Insufficient documentation

## 2020-12-30 DIAGNOSIS — K5730 Diverticulosis of large intestine without perforation or abscess without bleeding: Secondary | ICD-10-CM | POA: Insufficient documentation

## 2020-12-30 DIAGNOSIS — Z1211 Encounter for screening for malignant neoplasm of colon: Secondary | ICD-10-CM

## 2020-12-30 DIAGNOSIS — E119 Type 2 diabetes mellitus without complications: Secondary | ICD-10-CM | POA: Insufficient documentation

## 2020-12-30 DIAGNOSIS — D123 Benign neoplasm of transverse colon: Secondary | ICD-10-CM | POA: Insufficient documentation

## 2020-12-30 DIAGNOSIS — K640 First degree hemorrhoids: Secondary | ICD-10-CM | POA: Insufficient documentation

## 2020-12-30 DIAGNOSIS — Z87891 Personal history of nicotine dependence: Secondary | ICD-10-CM | POA: Insufficient documentation

## 2020-12-30 DIAGNOSIS — K635 Polyp of colon: Secondary | ICD-10-CM

## 2020-12-30 DIAGNOSIS — Z7982 Long term (current) use of aspirin: Secondary | ICD-10-CM | POA: Insufficient documentation

## 2020-12-30 DIAGNOSIS — Z8249 Family history of ischemic heart disease and other diseases of the circulatory system: Secondary | ICD-10-CM | POA: Insufficient documentation

## 2020-12-30 DIAGNOSIS — Z79899 Other long term (current) drug therapy: Secondary | ICD-10-CM | POA: Insufficient documentation

## 2020-12-30 DIAGNOSIS — Z7984 Long term (current) use of oral hypoglycemic drugs: Secondary | ICD-10-CM | POA: Insufficient documentation

## 2020-12-30 DIAGNOSIS — Z833 Family history of diabetes mellitus: Secondary | ICD-10-CM | POA: Insufficient documentation

## 2020-12-30 HISTORY — PX: COLONOSCOPY WITH PROPOFOL: SHX5780

## 2020-12-30 LAB — GLUCOSE, CAPILLARY: Glucose-Capillary: 126 mg/dL — ABNORMAL HIGH (ref 70–99)

## 2020-12-30 SURGERY — COLONOSCOPY WITH PROPOFOL
Anesthesia: General

## 2020-12-30 MED ORDER — PROPOFOL 500 MG/50ML IV EMUL
INTRAVENOUS | Status: DC | PRN
  Administered 2020-12-30: 100 ug/kg/min via INTRAVENOUS

## 2020-12-30 MED ORDER — SODIUM CHLORIDE 0.9 % IV SOLN: INTRAVENOUS | Status: DC

## 2020-12-30 MED ORDER — PROPOFOL 500 MG/50ML IV EMUL
INTRAVENOUS | Status: AC
  Filled 2020-12-30: qty 50

## 2020-12-30 MED ORDER — LIDOCAINE HCL (CARDIAC) PF 100 MG/5ML IV SOSY
PREFILLED_SYRINGE | INTRAVENOUS | Status: DC | PRN
  Administered 2020-12-30: 50 mg via INTRAVENOUS

## 2020-12-30 MED ORDER — PROPOFOL 10 MG/ML IV BOLUS
INTRAVENOUS | Status: DC | PRN
  Administered 2020-12-30: 80 mg via INTRAVENOUS
  Administered 2020-12-30: 30 mg via INTRAVENOUS

## 2020-12-30 MED ORDER — LIDOCAINE HCL (PF) 2 % IJ SOLN
INTRAMUSCULAR | Status: AC
  Filled 2020-12-30: qty 5

## 2020-12-30 NOTE — Transfer of Care (Signed)
Immediate Anesthesia Transfer of Care Note  Patient: Lisa Howell  Procedure(s) Performed: COLONOSCOPY WITH PROPOFOL (N/A )  Patient Location: PACU  Anesthesia Type:MAC  Level of Consciousness: awake, alert  and oriented  Airway & Oxygen Therapy: Patient Spontanous Breathing  Post-op Assessment: Report given to RN and Post -op Vital signs reviewed and stable  Post vital signs: stable  Last Vitals:  Vitals Value Taken Time  BP 132/89 12/30/20 0924  Temp    Pulse 87 12/30/20 0925  Resp 22 12/30/20 0925  SpO2 97 % 12/30/20 0925  Vitals shown include unvalidated device data.  Last Pain:  Vitals:   12/30/20 0825  TempSrc: Temporal  PainSc: 0-No pain         Complications: No complications documented.

## 2020-12-30 NOTE — Anesthesia Postprocedure Evaluation (Signed)
Anesthesia Post Note  Patient: Lisa Howell  Procedure(s) Performed: COLONOSCOPY WITH PROPOFOL (N/A )  Patient location during evaluation: PACU Anesthesia Type: General Level of consciousness: awake and alert Pain management: pain level controlled Vital Signs Assessment: post-procedure vital signs reviewed and stable Respiratory status: spontaneous breathing, nonlabored ventilation and respiratory function stable Cardiovascular status: blood pressure returned to baseline and stable Postop Assessment: no apparent nausea or vomiting Anesthetic complications: no   No complications documented.   Last Vitals:  Vitals:   12/30/20 0944 12/30/20 0954  BP:    Pulse:    Resp: 16 (!) 21  Temp:    SpO2:      Last Pain:  Vitals:   12/30/20 0954  TempSrc:   PainSc: 0-No pain                 Tera Mater

## 2020-12-30 NOTE — Anesthesia Preprocedure Evaluation (Addendum)
Anesthesia Evaluation  Patient identified by MRN, date of birth, ID band Patient awake    Reviewed: Allergy & Precautions, H&P , NPO status , Patient's Chart, lab work & pertinent test results  History of Anesthesia Complications Negative for: history of anesthetic complications  Airway Mallampati: III  TM Distance: >3 FB     Dental  (+) Poor Dentition   Pulmonary neg sleep apnea, neg COPD, former smoker,    breath sounds clear to auscultation       Cardiovascular (-) angina(-) Past MI and (-) Cardiac Stents negative cardio ROS  (-) dysrhythmias  Rhythm:regular Rate:Normal     Neuro/Psych  Headaches, negative psych ROS   GI/Hepatic negative GI ROS, Neg liver ROS,   Endo/Other  diabetesMorbid obesity  Renal/GU negative Renal ROS  negative genitourinary   Musculoskeletal   Abdominal   Peds  Hematology negative hematology ROS (+)   Anesthesia Other Findings Past Medical History: No date: Diabetes mellitus without complication ([facility]) No date: Hyperlipidemia  Past Surgical History: No date: ABDOMINAL HYSTERECTOMY No date: CESAREAN SECTION No date: CHOLECYSTECTOMY  BMI    Body Mass Index: 41.95 kg/m      Reproductive/Obstetrics negative OB ROS                            Anesthesia Physical Anesthesia Plan  ASA: III  Anesthesia Plan: General   Post-op Pain Management:    Induction:   PONV Risk Score and Plan: Propofol infusion and TIVA  Airway Management Planned: Simple Face Mask  Additional Equipment:   Intra-op Plan:   Post-operative Plan:   Informed Consent: I have reviewed the patients History and Physical, chart, labs and discussed the procedure including the risks, benefits and alternatives for the proposed anesthesia with the patient or authorized representative who has indicated his/her understanding and acceptance.     Dental Advisory Given  Plan Discussed  with: Anesthesiologist, CRNA and Surgeon  Anesthesia Plan Comments:         Anesthesia Quick Evaluation

## 2020-12-30 NOTE — Op Note (Signed)
[facility] Gastroenterology Patient Name: Lisa Howell Procedure Date: 12/30/2020 8:52 AM MRN: [id number] Account #: [id number] Date of Birth: 19-Apr-1971 Admit Type: Outpatient Age: 50 Room: [facility] ENDO ROOM 4 Gender: Female Note Status: Finalized Procedure:             Colonoscopy Indications:           Screening for colorectal malignant neoplasm Providers:             Lucilla Lame MD, MD Referring MD:          Lydia:             Propofol per Anesthesia Complications:         No immediate complications. Procedure:             Pre-Anesthesia Assessment:                        - Prior to the procedure, a History and Physical was                         performed, and patient medications and allergies were                         reviewed. The patient's tolerance of previous                         anesthesia was also reviewed. The risks and benefits                         of the procedure and the sedation options and risks                         were discussed with the patient. All questions were                         answered, and informed consent was obtained. Prior                         Anticoagulants: The patient has taken no previous                         anticoagulant or antiplatelet agents. ASA Grade                         Assessment: II - A patient with mild systemic disease.                         After reviewing the risks and benefits, the patient                         was deemed in satisfactory condition to undergo the                         procedure.                        After obtaining informed consent, the colonoscope was  passed under direct vision. Throughout the procedure,                         the patient's blood pressure, pulse, and oxygen                         saturations were monitored continuously. The                         Colonoscope was introduced through the anus and                          advanced to the the cecum, identified by appendiceal                         orifice and ileocecal valve. The colonoscopy was                         performed without difficulty. The patient tolerated                         the procedure well. The quality of the bowel                         preparation was good. Findings:      The perianal and digital rectal examinations were normal.      A 6 mm polyp was found in the ascending colon. The polyp was sessile.       The polyp was removed with a cold snare. Resection and retrieval were       complete.      A 4 mm polyp was found in the transverse colon. The polyp was sessile.       The polyp was removed with a cold snare. Resection and retrieval were       complete.      Multiple small-mouthed diverticula were found in the sigmoid colon.      Non-bleeding internal hemorrhoids were found during retroflexion. The       hemorrhoids were Grade I (internal hemorrhoids that do not prolapse). Impression:            - One 6 mm polyp in the ascending colon, removed with                         a cold snare. Resected and retrieved.                        - One 4 mm polyp in the transverse colon, removed with                         a cold snare. Resected and retrieved.                        - Diverticulosis in the sigmoid colon.                        - Non-bleeding internal hemorrhoids. Recommendation:        - Discharge patient to home.                        -  Resume previous diet.                        - Continue present medications.                        - Repeat colonoscopy in 5 years if polyp adenoma and                         10 years if hyperplastic Procedure Code(s):     --- Professional ---                        [phone], Colonoscopy, flexible; with removal of                         tumor(s), polyp(s), or other lesion(s) by snare                         technique Diagnosis Code(s):     --- Professional ---                         Z12.11, Encounter for screening for malignant neoplasm                         of colon                        K63.5, Polyp of colon CPT copyright 2019 American Medical Association. All rights reserved. The codes documented in this report are preliminary and upon coder review may  be revised to meet current compliance requirements. Lucilla Lame MD, MD 12/30/2020 9:22:26 AM This report has been signed electronically. Number of Addenda: 0 Note Initiated On: 12/30/2020 8:52 AM Scope Withdrawal Time: 0 hours 9 minutes 27 seconds  Total Procedure Duration: 0 hours 14 minutes 58 seconds  Estimated Blood Loss:  Estimated blood loss: none.      [facility]

## 2020-12-30 NOTE — H&P (Signed)
Lucilla Lame, MD [address] [place], [place] [address] Phone: [phone] Fax : [phone]  Primary Care Physician:  [facility] Primary Gastroenterologist:  Dr. Allen Norris  Pre-Procedure History & Physical: HPI:  Lisa Howell is a 50 y.o. female is here for a screening colonoscopy.   Past Medical History:  Diagnosis Date  . Diabetes mellitus without complication ([place])   . Hyperlipidemia     Past Surgical History:  Procedure Laterality Date  . ABDOMINAL HYSTERECTOMY    . CESAREAN SECTION    . CHOLECYSTECTOMY      Prior to Admission medications   Medication Sig Start Date End Date Taking? Authorizing Provider  blood glucose meter kit and supplies KIT Dispense based on patient and insurance preference. Use up to four times daily as directed. (FOR ICD-9 250.00, 250.01). 05/31/20   Oswald Hillock, MD  blood glucose meter kit and supplies KIT Dispense based on patient and insurance preference. Use up to four times daily as directed. (FOR ICD-9 250.00, 250.01). 07/10/20   Iloabachie, Chioma E, NP  metFORMIN (GLUCOPHAGE XR) 500 MG 24 hr tablet Take 1 tablet (500 mg total) by mouth daily with breakfast. 11/18/20   Iloabachie, Chioma E, NP  pravastatin (PRAVACHOL) 20 MG tablet Take 1 tablet (20 mg total) by mouth daily. 11/18/20   Iloabachie, Chioma E, NP    Allergies as of 12/17/2020 - Review Complete 12/17/2020  Allergen Reaction Noted  . Aspirin Other (See Comments) 12/20/2014    Family History  Problem Relation Age of Onset  . Diabetes Mother   . Heart disease Mother   . Cancer Mother   . Diabetes Father   . Cancer Father   . Diabetes Sister   . Diabetes Brother     Social History   Socioeconomic History  . Marital status: Divorced    Spouse name: Not on file  . Number of children: Not on file  . Years of education: Not on file  . Highest education level: Not on file  Occupational History  . Not on file  Tobacco Use  . Smoking  status: Former Smoker    Years: 2.00    Quit date: 12/31/1999    Years since quitting: 21.0  . Smokeless tobacco: Never Used  Vaping Use  . Vaping Use: Never used  Substance and Sexual Activity  . Alcohol use: No  . Drug use: No  . Sexual activity: Not on file  Other Topics Concern  . Not on file  Social History Narrative  . Not on file   Social Determinants of Health   Financial Resource Strain: High Risk  . Difficulty of Paying Living Expenses: Very hard  Food Insecurity: No Food Insecurity  . Worried About Charity fundraiser in the Last Year: Never true  . Ran Out of Food in the Last Year: Never true  Transportation Needs: No Transportation Needs  . Lack of Transportation (Medical): No  . Lack of Transportation (Non-Medical): No  Physical Activity: Insufficiently Active  . Days of Exercise per Week: 7 days  . Minutes of Exercise per Session: 10 min  Stress: Stress Concern Present  . Feeling of Stress : Very much  Social Connections: Moderately Isolated  . Frequency of Communication with Friends and Family: More than three times a week  . Frequency of Social Gatherings with Friends and Family: More than three times a week  . Attends Religious Services: 1 to 4 times per year  .  Active Member of Clubs or Organizations: No  . Attends Archivist Meetings: Never  . Marital Status: Divorced  Human resources officer Violence: Not At Risk  . Fear of Current or Ex-Partner: No  . Emotionally Abused: No  . Physically Abused: No  . Sexually Abused: No    Review of Systems: See HPI, otherwise negative ROS  Physical Exam: BP (!) 145/95   Pulse 86   Temp (!) 96.9 F (36.1 C) (Temporal)   Resp 20   Ht 5' 1"  (1.549 m)   Wt 100.7 kg   SpO2 97%   BMI 41.95 kg/m  General:   Alert,  pleasant and cooperative in NAD Head:  Normocephalic and atraumatic. Neck:  Supple; no masses or thyromegaly. Lungs:  Clear throughout to auscultation.    Heart:  Regular rate and  rhythm. Abdomen:  Soft, nontender and nondistended. Normal bowel sounds, without guarding, and without rebound.   Neurologic:  Alert and  oriented x4;  grossly normal neurologically.  Impression/Plan: Lisa Howell is now here to undergo a screening colonoscopy.  Risks, benefits, and alternatives regarding colonoscopy have been reviewed with the patient.  Questions have been answered.  All parties agreeable.

## 2020-12-31 ENCOUNTER — Encounter: Payer: Self-pay | Admitting: Gastroenterology

## 2020-12-31 LAB — SURGICAL PATHOLOGY

## 2021-02-25 ENCOUNTER — Other Ambulatory Visit: Payer: Self-pay

## 2021-02-25 DIAGNOSIS — E119 Type 2 diabetes mellitus without complications: Secondary | ICD-10-CM

## 2021-02-25 DIAGNOSIS — Z0000 Encounter for general adult medical examination without abnormal findings: Secondary | ICD-10-CM

## 2021-02-25 DIAGNOSIS — E785 Hyperlipidemia, unspecified: Secondary | ICD-10-CM

## 2021-02-26 LAB — LIPID PANEL
Chol/HDL Ratio: 5.8 ratio — ABNORMAL HIGH (ref 0.0–4.4)
Cholesterol, Total: 249 mg/dL — ABNORMAL HIGH (ref 100–199)
HDL: 43 mg/dL (ref >39)
LDL Chol Calc (NIH): 173 mg/dL — ABNORMAL HIGH (ref 0–99)
Triglycerides: 179 mg/dL — ABNORMAL HIGH (ref 0–149)
VLDL Cholesterol Cal: 33 mg/dL (ref 5–40)

## 2021-02-26 LAB — HEMOGLOBIN A1C
Est. average glucose Bld gHb Est-mCnc: 143 mg/dL
Hgb A1c MFr Bld: 6.6 % — ABNORMAL HIGH (ref 4.8–5.6)

## 2021-02-26 LAB — TSH: TSH: 1.07 u[IU]/mL (ref 0.450–4.500)

## 2021-03-03 ENCOUNTER — Ambulatory Visit: Payer: Self-pay | Admitting: Gerontology

## 2021-03-03 ENCOUNTER — Other Ambulatory Visit: Payer: Self-pay

## 2021-03-03 ENCOUNTER — Encounter: Payer: Self-pay | Admitting: Gerontology

## 2021-03-03 VITALS — BP 106/70 | HR 83 | Temp 97.6°F | Resp 16 | Ht 61.0 in | Wt 225.9 lb

## 2021-03-03 DIAGNOSIS — E785 Hyperlipidemia, unspecified: Secondary | ICD-10-CM

## 2021-03-03 DIAGNOSIS — E119 Type 2 diabetes mellitus without complications: Secondary | ICD-10-CM

## 2021-03-03 MED ORDER — METFORMIN HCL ER 500 MG PO TB24
1000.0000 mg | ORAL_TABLET | Freq: Every day | ORAL | 3 refills | Status: DC
  Filled 2021-03-03: qty 28, 14d supply, fill #0

## 2021-03-03 MED ORDER — PRAVASTATIN SODIUM 40 MG PO TABS
40.0000 mg | ORAL_TABLET | Freq: Every day | ORAL | 2 refills | Status: DC
  Filled 2021-03-03: qty 14, 14d supply, fill #0

## 2021-03-03 NOTE — Progress Notes (Signed)
Established Patient Office Visit  Subjective:  Patient ID: Lisa Howell, female    DOB: [birth year]  Age: 50 y.o. MRN: [id number]  CC:  Chief Complaint  Patient presents with  . Follow-up    Lab results from 02/25/21    HPI Lisa Howell is a 50 y/o female who has  History of T2DM, Hyperlipidemia, presents for routine follow up and lab review. Her HgbA1c done on 02/25/21 increased from 5.6% to 6.6%, she states that she's going through a lot of stress and not adhering to ADA diet. She doesn't check her blood sugar at home, denies hypo/hyperglycemic symptoms, peripheral neuropathy and performs daily foot checks. Her LDL decreased from 184 to 173 mg/dl, total cholesterol from 263 to 249 mg/dl, Triglycerides 195 to 179 mg/dl and her HDL was 43 mg/dl. She had Colonoscopy done on 12/30/20 and One 6 mm polyp in the ascending colon, removed with a cold snare. Resected and retrieved.-One 4 mm polyp in the transverse colon, removed with a cold snare. Resected and retrieved. Diverticulosis in the sigmoid colon. - Non-bleeding internal hemorrhoids and she will follow up with a repeat Colonoscopy in 5 years. Overall, she states that she's doing well and offers no further complaint.   Past Medical History:  Diagnosis Date  . Diabetes mellitus without complication ([place])   . Hyperlipidemia     Past Surgical History:  Procedure Laterality Date  . ABDOMINAL HYSTERECTOMY    . CESAREAN SECTION    . CHOLECYSTECTOMY    . COLONOSCOPY WITH PROPOFOL N/A 12/30/2020   Procedure: COLONOSCOPY WITH PROPOFOL;  Surgeon: Lucilla Lame, MD;  Location: [facility] ENDOSCOPY;  Service: Endoscopy;  Laterality: N/A;    Family History  Problem Relation Age of Onset  . Diabetes Mother   . Heart disease Mother   . Cancer Mother        "bone"  . Diabetes Father   . Cancer Father        colon  . Diabetes Sister   . Diabetes Brother   . Heart disease Maternal Grandmother   . Heart disease Maternal Grandfather   .  Dementia Paternal Grandmother   . Alzheimer's disease Paternal Grandfather   . Diabetes Half-Sister     Social History   Socioeconomic History  . Marital status: Divorced    Spouse name: Not on file  . Number of children: Not on file  . Years of education: Not on file  . Highest education level: Not on file  Occupational History  . Not on file  Tobacco Use  . Smoking status: Former Smoker    Years: 2.00    Quit date: 12/31/1999    Years since quitting: 21.1  . Smokeless tobacco: Never Used  Vaping Use  . Vaping Use: Never used  Substance and Sexual Activity  . Alcohol use: Yes    Comment: social  . Drug use: No  . Sexual activity: Not on file  Other Topics Concern  . Not on file  Social History Narrative  . Not on file   Social Determinants of Health   Financial Resource Strain: High Risk  . Difficulty of Paying Living Expenses: Very hard  Food Insecurity: No Food Insecurity  . Worried About Charity fundraiser in the Last Year: Never true  . Ran Out of Food in the Last Year: Never true  Transportation Needs: No Transportation Needs  . Lack of Transportation (Medical): No  . Lack of Transportation (Non-Medical): No  Physical Activity: Insufficiently  Active  . Days of Exercise per Week: 7 days  . Minutes of Exercise per Session: 10 min  Stress: Stress Concern Present  . Feeling of Stress : Very much  Social Connections: Moderately Isolated  . Frequency of Communication with Friends and Family: More than three times a week  . Frequency of Social Gatherings with Friends and Family: More than three times a week  . Attends Religious Services: 1 to 4 times per year  . Active Member of Clubs or Organizations: No  . Attends Archivist Meetings: Never  . Marital Status: Divorced  Human resources officer Violence: Not At Risk  . Fear of Current or Ex-Partner: No  . Emotionally Abused: No  . Physically Abused: No  . Sexually Abused: No    Outpatient Medications  Prior to Visit  Medication Sig Dispense Refill  . blood glucose meter kit and supplies KIT Dispense based on patient and insurance preference. Use up to four times daily as directed. (FOR ICD-9 250.00, 250.01). 1 each 0  . glucose blood test strip USE TO TEST BLOOD GLUCOSE UP TO 4 TIMES PER DAY, IN THE MORNING AND AFTER MEALS (Patient taking differently: USE TO TEST BLOOD GLUCOSE UP TO 4 TIMES PER DAY, IN THE MORNING AND AFTER MEALS) 100 strip 11  . metFORMIN (GLUCOPHAGE-XR) 500 MG 24 hr tablet TAKE ONE TABLET BY MOUTH EVERY DAY WITH BREAKFAST 30 tablet 3  . pravastatin (PRAVACHOL) 40 MG tablet TAKE 1/2 TABLET(20MG TOTAL) BY MOUTH EVERY DAY 15 tablet 2  . blood glucose meter kit and supplies KIT Dispense based on patient and insurance preference. Use up to four times daily as directed. (FOR ICD-9 250.00, 250.01). 1 each 0  . polyethylene glycol-electrolytes (NULYTELY) 420 g solution FILL CONTAINER TO THE FILL LINE WITH CLEAR LIQUID. MIX WELL. DRINK 8OZ EVERY 30 MINUTES UNTIL ENTIRE CONTENTS HAVE BEEN COMPLETED. 4000 mL 0  . pravastatin (PRAVACHOL) 20 MG tablet Take 1 tablet (20 mg total) by mouth daily. 30 tablet 3   No facility-administered medications prior to visit.    Allergies  Allergen Reactions  . Aspirin Other (See Comments)    Drowsy     ROS Review of Systems  Constitutional: Negative.   Eyes: Negative.   Respiratory: Negative.   Cardiovascular: Negative.   Endocrine: Negative.   Skin: Negative.   Neurological: Negative.   Psychiatric/Behavioral: Negative.       Objective:    Physical Exam HENT:     Head: Normocephalic and atraumatic.     Mouth/Throat:     Mouth: Mucous membranes are moist.  Eyes:     Extraocular Movements: Extraocular movements intact.     Conjunctiva/sclera: Conjunctivae normal.     Pupils: Pupils are equal, round, and reactive to light.  Cardiovascular:     Rate and Rhythm: Normal rate and regular rhythm.     Pulses: Normal pulses.     Heart  sounds: Normal heart sounds.  Pulmonary:     Effort: Pulmonary effort is normal.     Breath sounds: Normal breath sounds.  Skin:    General: Skin is warm.  Neurological:     General: No focal deficit present.     Mental Status: She is alert and oriented to person, place, and time. Mental status is at baseline.  Psychiatric:        Mood and Affect: Mood normal.        Behavior: Behavior normal.        Thought Content: Thought  content normal.        Judgment: Judgment normal.     BP 106/70 (BP Location: Right Arm, Patient Position: Sitting, Cuff Size: Large)   Pulse 83   Temp 97.6 F (36.4 C)   Resp 16   Ht 5' 1"  (1.549 m)   Wt 225 lb 14.4 oz (102.5 kg)   SpO2 96%   BMI 42.68 kg/m  Wt Readings from Last 3 Encounters:  03/03/21 225 lb 14.4 oz (102.5 kg)  02/25/21 226 lb (102.5 kg)  12/30/20 222 lb (100.7 kg)   Weight loss encouraged  Health Maintenance Due  Topic Date Due  . COVID-19 Vaccine (1) Never done  . Hepatitis C Screening  Never done  . TETANUS/TDAP  Never done  . PAP SMEAR-Modifier  Never done  . MAMMOGRAM  Never done    There are no preventive care reminders to display for this patient.  Lab Results  Component Value Date   TSH 1.070 02/25/2021   Lab Results  Component Value Date   WBC 9.4 08/06/2020   HGB 12.8 08/06/2020   HCT 38.5 08/06/2020   MCV 82 08/06/2020   PLT 374 08/06/2020   Lab Results  Component Value Date   NA 142 08/06/2020   K 4.1 08/06/2020   CO2 25 08/06/2020   GLUCOSE 113 (H) 08/06/2020   BUN 6 08/06/2020   CREATININE 0.66 08/06/2020   BILITOT 0.8 05/31/2020   ALKPHOS 59 05/31/2020   AST 28 05/31/2020   ALT 24 05/31/2020   PROT 6.5 05/31/2020   ALBUMIN 3.2 (L) 05/31/2020   CALCIUM 9.4 08/06/2020   ANIONGAP 12 05/31/2020   Lab Results  Component Value Date   CHOL 249 (H) 02/25/2021   Lab Results  Component Value Date   HDL 43 02/25/2021   Lab Results  Component Value Date   LDLCALC 173 (H) 02/25/2021   Lab  Results  Component Value Date   TRIG 179 (H) 02/25/2021   Lab Results  Component Value Date   CHOLHDL 5.8 (H) 02/25/2021   Lab Results  Component Value Date   HGBA1C 6.6 (H) 02/25/2021      Assessment & Plan:     1. Type 2 diabetes mellitus without complication, without long-term current use of insulin (HCC) -Her HgbA1c was 6.6%, her Metformin was increased to 1000 mg daily, she was advised to continue on low carb/non concentrated sweet diet and exercise as tolerated. - metFORMIN (GLUCOPHAGE-XR) 500 MG 24 hr tablet; Take 2 tablets (1,000 mg total) by mouth daily with breakfast.  Dispense: 60 tablet; Refill: 3 - HgB A1c; Future  2. Elevated lipids -The 10-year ASCVD risk score Mikey Bussing DC Jr., et al., 2013) is: 3.4%   Values used to calculate the score:     Age: 31 years     Sex: Female     Is Non-Hispanic African American: No     Diabetic: Yes     Tobacco smoker: No     Systolic Blood Pressure: 357 mmHg     Is BP treated: No     HDL Cholesterol: 43 mg/dL     Total Cholesterol: 249 mg/dL Her ASCVD risk was 3.4%, her Pravastatin was increased to 40 mg daily, and was encouraged to continue on low fat/non concentrated sweet diet and exercise as tolerated. - pravastatin (PRAVACHOL) 40 MG tablet; Take 1 tablet (40 mg total) by mouth daily.  Dispense: 30 tablet; Refill: 2 - Lipid panel; Future  3. Class 3 severe obesity without  serious comorbidity with body mass index (BMI) of 40.0 to 44.9 in adult, unspecified obesity type ([place]) - Her BMI is greater than 40, -She was advised on lifestyle modifications including diet, portion sizes, reading food label and exercise. -Counseled on the importance of exercise, starting an exercise program of walking 30 minutes 3-5 times a week.  Follow-up: Return in about 13 weeks (around 06/02/2021), or if symptoms worsen or fail to improve.    [place] Jerold Coombe, NP

## 2021-03-03 NOTE — Patient Instructions (Signed)
Heart-Healthy Eating Plan Heart-healthy meal planning includes:  Eating less unhealthy fats.  Eating more healthy fats.  Making other changes in your diet. Talk with your doctor or a diet specialist (dietitian) to create an eating plan that is right for you. What is my plan? Your doctor may recommend an eating plan that includes:  Total fat: ______% or less of total calories a day.  Saturated fat: ______% or less of total calories a day.  Cholesterol: less than _________mg a day. What are tips for following this plan? Cooking Avoid frying your food. Try to bake, boil, grill, or broil it instead. You can also reduce fat by:  Removing the skin from poultry.  Removing all visible fats from meats.  Steaming vegetables in water or broth. Meal planning  At meals, divide your plate into four equal parts: ? Fill one-half of your plate with vegetables and green salads. ? Fill one-fourth of your plate with whole grains. ? Fill one-fourth of your plate with lean protein foods.  Eat 4-5 servings of vegetables per day. A serving of vegetables is: ? 1 cup of raw or cooked vegetables. ? 2 cups of raw leafy greens.  Eat 4-5 servings of fruit per day. A serving of fruit is: ? 1 medium whole fruit. ?  cup of dried fruit. ?  cup of fresh, frozen, or canned fruit. ?  cup of 100% fruit juice.  Eat more foods that have soluble fiber. These are apples, broccoli, carrots, beans, peas, and barley. Try to get 20-30 g of fiber per day.  Eat 4-5 servings of nuts, legumes, and seeds per week: ? 1 serving of dried beans or legumes equals  cup after being cooked. ? 1 serving of nuts is  cup. ? 1 serving of seeds equals 1 tablespoon.   General information  Eat more home-cooked food. Eat less restaurant, buffet, and fast food.  Limit or avoid alcohol.  Limit foods that are high in starch and sugar.  Avoid fried foods.  Lose weight if you are overweight.  Keep track of how much salt  (sodium) you eat. This is important if you have high blood pressure. Ask your doctor to tell you more about this.  Try to add vegetarian meals each week. Fats  Choose healthy fats. These include olive oil and canola oil, flaxseeds, walnuts, almonds, and seeds.  Eat more omega-3 fats. These include salmon, mackerel, sardines, tuna, flaxseed oil, and ground flaxseeds. Try to eat fish at least 2 times each week.  Check food labels. Avoid foods with trans fats or high amounts of saturated fat.  Limit saturated fats. ? These are often found in animal products, such as meats, butter, and cream. ? These are also found in plant foods, such as palm oil, palm kernel oil, and coconut oil.  Avoid foods with partially hydrogenated oils in them. These have trans fats. Examples are stick margarine, some tub margarines, cookies, crackers, and other baked goods. What foods can I eat? Fruits All fresh, canned (in natural juice), or frozen fruits. Vegetables Fresh or frozen vegetables (raw, steamed, roasted, or grilled). Green salads. Grains Most grains. Choose whole wheat and whole grains most of the time. Rice and pasta, including brown rice and pastas made with whole wheat. Meats and other proteins Lean, well-trimmed beef, veal, pork, and lamb. Chicken and turkey without skin. All fish and shellfish. Wild duck, rabbit, pheasant, and venison. Egg whites or low-cholesterol egg substitutes. Dried beans, peas, lentils, and tofu. Seeds and   most nuts. Dairy Low-fat or nonfat cheeses, including ricotta and mozzarella. Skim or 1% milk that is liquid, powdered, or evaporated. Buttermilk that is made with low-fat milk. Nonfat or low-fat yogurt. Fats and oils Non-hydrogenated (trans-free) margarines. Vegetable oils, including soybean, sesame, sunflower, olive, peanut, safflower, corn, canola, and cottonseed. Salad dressings or mayonnaise made with a vegetable oil. Beverages Mineral water. Coffee and tea. Diet  carbonated beverages. Sweets and desserts Sherbet, gelatin, and fruit ice. Small amounts of dark chocolate. Limit all sweets and desserts. Seasonings and condiments All seasonings and condiments. The items listed above may not be a complete list of foods and drinks you can eat. Contact a dietitian for more options. What foods should I avoid? Fruits Canned fruit in heavy syrup. Fruit in cream or butter sauce. Fried fruit. Limit coconut. Vegetables Vegetables cooked in cheese, cream, or butter sauce. Fried vegetables. Grains Breads that are made with saturated or trans fats, oils, or whole milk. Croissants. Sweet rolls. Donuts. High-fat crackers, such as cheese crackers. Meats and other proteins Fatty meats, such as hot dogs, ribs, sausage, bacon, rib-eye roast or steak. High-fat deli meats, such as salami and bologna. Caviar. Domestic duck and goose. Organ meats, such as liver. Dairy Cream, sour cream, cream cheese, and creamed cottage cheese. Whole-milk cheeses. Whole or 2% milk that is liquid, evaporated, or condensed. Whole buttermilk. Cream sauce or high-fat cheese sauce. Yogurt that is made from whole milk. Fats and oils Meat fat, or shortening. Cocoa butter, hydrogenated oils, palm oil, coconut oil, palm kernel oil. Solid fats and shortenings, including bacon fat, salt pork, lard, and butter. Nondairy cream substitutes. Salad dressings with cheese or sour cream. Beverages Regular sodas and juice drinks with added sugar. Sweets and desserts Frosting. Pudding. Cookies. Cakes. Pies. Milk chocolate or white chocolate. Buttered syrups. Full-fat ice cream or ice cream drinks. The items listed above may not be a complete list of foods and drinks to avoid. Contact a dietitian for more information. Summary  Heart-healthy meal planning includes eating less unhealthy fats, eating more healthy fats, and making other changes in your diet.  Eat a balanced diet. This includes fruits and  vegetables, low-fat or nonfat dairy, lean protein, nuts and legumes, whole grains, and heart-healthy oils and fats. This information is not intended to replace advice given to you by your health care provider. Make sure you discuss any questions you have with your health care provider. Document Revised: 12/01/2017 Document Reviewed: 11/04/2017 Elsevier Patient Education  2021 [employer]. https://www.diabeteseducator.org/docs/default-source/living-with-diabetes/conquering-the-grocery-store-v1.pdf?sfvrsn=4">  Carbohydrate Counting for Diabetes Mellitus, Adult Carbohydrate counting is a method of keeping track of how many carbohydrates you eat. Eating carbohydrates naturally increases the amount of sugar (glucose) in the blood. Counting how many carbohydrates you eat improves your blood glucose control, which helps you manage your diabetes. It is important to know how many carbohydrates you can safely have in each meal. This is different for every person. A dietitian can help you make a meal plan and calculate how many carbohydrates you should have at each meal and snack. What foods contain carbohydrates? Carbohydrates are found in the following foods:  Grains, such as breads and cereals.  Dried beans and soy products.  Starchy vegetables, such as potatoes, peas, and corn.  Fruit and fruit juices.  Milk and yogurt.  Sweets and snack foods, such as cake, cookies, candy, chips, and soft drinks.   How do I count carbohydrates in foods? There are two ways to count carbohydrates in food. You can read  food labels or learn standard serving sizes of foods. You can use either of the methods or a combination of both. Using the Nutrition Facts label The Nutrition Facts list is included on the labels of almost all packaged foods and beverages in the [place] It includes:  The serving size.  Information about nutrients in each serving, including the grams (g) of carbohydrate per serving. To use the  Nutrition Facts:  Decide how many servings you will have.  Multiply the number of servings by the number of carbohydrates per serving.  The resulting number is the total amount of carbohydrates that you will be having. Learning the standard serving sizes of foods When you eat carbohydrate foods that are not packaged or do not include Nutrition Facts on the label, you need to measure the servings in order to count the amount of carbohydrates.  Measure the foods that you will eat with a food scale or measuring cup, if needed.  Decide how many standard-size servings you will eat.  Multiply the number of servings by 15. For foods that contain carbohydrates, one serving equals 15 g of carbohydrates. ? For example, if you eat 2 cups or 10 oz (300 g) of strawberries, you will have eaten 2 servings and 30 g of carbohydrates (2 servings x 15 g = 30 g).  For foods that have more than one food mixed, such as soups and casseroles, you must count the carbohydrates in each food that is included. The following list contains standard serving sizes of common carbohydrate-rich foods. Each of these servings has about 15 g of carbohydrates:  1 slice of bread.  1 six-inch (15 cm) tortilla.  ? cup or 2 oz (53 g) cooked rice or pasta.   cup or 3 oz (85 g) cooked or canned, drained and rinsed beans or lentils.   cup or 3 oz (85 g) starchy vegetable, such as peas, corn, or squash.   cup or 4 oz (120 g) hot cereal.   cup or 3 oz (85 g) boiled or mashed potatoes, or  or 3 oz (85 g) of a large baked potato.   cup or 4 fl oz (118 mL) fruit juice.  1 cup or 8 fl oz (237 mL) milk.  1 small or 4 oz (106 g) apple.   or 2 oz (63 g) of a medium banana.  1 cup or 5 oz (150 g) strawberries.  3 cups or 1 oz (24 g) popped popcorn. What is an example of carbohydrate counting? To calculate the number of carbohydrates in this sample meal, follow the steps shown below. Sample meal  3 oz (85 g) chicken  breast.  ? cup or 4 oz (106 g) brown rice.   cup or 3 oz (85 g) corn.  1 cup or 8 fl oz (237 mL) milk.  1 cup or 5 oz (150 g) strawberries with sugar-free whipped topping. Carbohydrate calculation 1. Identify the foods that contain carbohydrates: ? Rice. ? Corn. ? Milk. ? Strawberries. 2. Calculate how many servings you have of each food: ? 2 servings rice. ? 1 serving corn. ? 1 serving milk. ? 1 serving strawberries. 3. Multiply each number of servings by 15 g: ? 2 servings rice x 15 g = 30 g. ? 1 serving corn x 15 g = 15 g. ? 1 serving milk x 15 g = 15 g. ? 1 serving strawberries x 15 g = 15 g. 4. Add together all of the amounts to find the  total grams of carbohydrates eaten: ? 30 g + 15 g + 15 g + 15 g = 75 g of carbohydrates total. What are tips for following this plan? Shopping  Develop a meal plan and then make a shopping list.  Buy fresh and frozen vegetables, fresh and frozen fruit, dairy, eggs, beans, lentils, and whole grains.  Look at food labels. Choose foods that have more fiber and less sugar.  Avoid processed foods and foods with added sugars. Meal planning  Aim to have the same amount of carbohydrates at each meal and for each snack time.  Plan to have regular, balanced meals and snacks. Where to find more information  American Diabetes Association: www.diabetes.org  Centers for Disease Control and Prevention: http://www.wolf.info/ Summary  Carbohydrate counting is a method of keeping track of how many carbohydrates you eat.  Eating carbohydrates naturally increases the amount of sugar (glucose) in the blood.  Counting how many carbohydrates you eat improves your blood glucose control, which helps you manage your diabetes.  A dietitian can help you make a meal plan and calculate how many carbohydrates you should have at each meal and snack. This information is not intended to replace advice given to you by your health care provider. Make sure you discuss  any questions you have with your health care provider. Document Revised: 09/27/2019 Document Reviewed: 09/28/2019 Elsevier Patient Education  2021 [employer].

## 2021-03-04 ENCOUNTER — Ambulatory Visit: Payer: Self-pay | Admitting: Gerontology

## 2021-03-04 ENCOUNTER — Other Ambulatory Visit: Payer: Self-pay

## 2021-04-06 ENCOUNTER — Other Ambulatory Visit: Payer: Self-pay

## 2021-05-01 ENCOUNTER — Other Ambulatory Visit: Payer: Self-pay

## 2021-05-27 ENCOUNTER — Other Ambulatory Visit: Payer: Self-pay

## 2021-05-27 DIAGNOSIS — E119 Type 2 diabetes mellitus without complications: Secondary | ICD-10-CM

## 2021-05-27 DIAGNOSIS — E785 Hyperlipidemia, unspecified: Secondary | ICD-10-CM

## 2021-05-28 LAB — LIPID PANEL
Chol/HDL Ratio: 5.5 ratio — ABNORMAL HIGH (ref 0.0–4.4)
Cholesterol, Total: 227 mg/dL — ABNORMAL HIGH (ref 100–199)
HDL: 41 mg/dL (ref >39)
LDL Chol Calc (NIH): 158 mg/dL — ABNORMAL HIGH (ref 0–99)
Triglycerides: 156 mg/dL — ABNORMAL HIGH (ref 0–149)
VLDL Cholesterol Cal: 28 mg/dL (ref 5–40)

## 2021-05-28 LAB — HEMOGLOBIN A1C
Est. average glucose Bld gHb Est-mCnc: 148 mg/dL
Hgb A1c MFr Bld: 6.8 % — ABNORMAL HIGH (ref 4.8–5.6)

## 2021-06-03 ENCOUNTER — Ambulatory Visit: Payer: Self-pay | Admitting: Gerontology

## 2021-06-09 ENCOUNTER — Ambulatory Visit: Payer: Self-pay | Admitting: Gerontology

## 2021-06-09 ENCOUNTER — Encounter: Payer: Self-pay | Admitting: Gerontology

## 2021-06-09 VITALS — BP 116/80 | HR 80 | Temp 98.4°F | Resp 16 | Wt 225.8 lb

## 2021-06-09 DIAGNOSIS — E785 Hyperlipidemia, unspecified: Secondary | ICD-10-CM

## 2021-06-09 DIAGNOSIS — E119 Type 2 diabetes mellitus without complications: Secondary | ICD-10-CM

## 2021-06-09 NOTE — Progress Notes (Signed)
Established Patient Office Visit  Subjective:  Patient ID: Lisa Howell, female    DOB: 05/05/1971  Age: 50 y.o. MRN: [id number]  CC: No chief complaint on file.   HPI Lisa Howell is a 50 y/o female who has history of Type 2 Diabetes, Hyperlipidemia, presents for routine follow up and lab review. She state that she's compliant with her medications and continues to make healthy lifestyle changes. Her HgbA1c done on 05/27/21 increased from 6.6% to 6.8%. She checks her fasting blood glucose daily and states that it ranges between 89-90 mg/dl. She denies hypo/hyperglycemic symptoms, peripheral neuropathy and performs daily foot checks. Her LDL decreased from 173 mg/dl to 158 mg/dl, Total cholesterol decreased from 249 mg/dl to 227 mg/dl and Triglyceride decreased from 179 to 156 mg/dl. Overall, she states that she's doing well and offers no further complaint.   Past Medical History:  Diagnosis Date   Diabetes mellitus without complication ([place])    Hyperlipidemia     Past Surgical History:  Procedure Laterality Date   ABDOMINAL HYSTERECTOMY     CESAREAN SECTION     CHOLECYSTECTOMY     COLONOSCOPY WITH PROPOFOL N/A 12/30/2020   Procedure: COLONOSCOPY WITH PROPOFOL;  Surgeon: Lucilla Lame, MD;  Location: [facility] ENDOSCOPY;  Service: Endoscopy;  Laterality: N/A;    Family History  Problem Relation Age of Onset   Diabetes Mother    Heart disease Mother    Cancer Mother        "bone"   Diabetes Father    Cancer Father        colon   Diabetes Sister    Diabetes Brother    Heart disease Maternal Grandmother    Heart disease Maternal Grandfather    Dementia Paternal Grandmother    Alzheimer's disease Paternal Grandfather    Diabetes Half-Sister     Social History   Socioeconomic History   Marital status: Divorced    Spouse name: Not on file   Number of children: Not on file   Years of education: Not on file   Highest education level: Not on file  Occupational History    Not on file  Tobacco Use   Smoking status: Former    Years: 2.00    Types: Cigarettes    Quit date: 12/31/1999    Years since quitting: 21.4   Smokeless tobacco: Never  Vaping Use   Vaping Use: Never used  Substance and Sexual Activity   Alcohol use: Yes    Comment: social   Drug use: No   Sexual activity: Not on file  Other Topics Concern   Not on file  Social History Narrative   Not on file   Social Determinants of Health   Financial Resource Strain: High Risk   Difficulty of Paying Living Expenses: Very hard  Food Insecurity: No Food Insecurity   Worried About Charity fundraiser in the Last Year: Never true   Ran Out of Food in the Last Year: Never true  Transportation Needs: No Transportation Needs   Lack of Transportation (Medical): No   Lack of Transportation (Non-Medical): No  Physical Activity: Insufficiently Active   Days of Exercise per Week: 7 days   Minutes of Exercise per Session: 10 min  Stress: Stress Concern Present   Feeling of Stress : Very much  Social Connections: Moderately Isolated   Frequency of Communication with Friends and Family: More than three times a week   Frequency of Social Gatherings with Friends and  Family: More than three times a week   Attends Religious Services: 1 to 4 times per year   Active Member of Clubs or Organizations: No   Attends Archivist Meetings: Never   Marital Status: Divorced  Human resources officer Violence: Not At Risk   Fear of Current or Ex-Partner: No   Emotionally Abused: No   Physically Abused: No   Sexually Abused: No    Outpatient Medications Prior to Visit  Medication Sig Dispense Refill   blood glucose meter kit and supplies KIT Dispense based on patient and insurance preference. Use up to four times daily as directed. (FOR ICD-9 250.00, 250.01). 1 each 0   glucose blood test strip USE TO TEST BLOOD GLUCOSE UP TO 4 TIMES PER DAY, IN THE MORNING AND AFTER MEALS (Patient taking differently: USE  TO TEST BLOOD GLUCOSE UP TO 4 TIMES PER DAY, IN THE MORNING AND AFTER MEALS) 100 strip 11   metFORMIN (GLUCOPHAGE-XR) 500 MG 24 hr tablet Take 2 tablets (1,000 mg total) by mouth daily with breakfast. 60 tablet 3   pravastatin (PRAVACHOL) 40 MG tablet Take 1 tablet (40 mg total) by mouth daily. 30 tablet 2   No facility-administered medications prior to visit.    Allergies  Allergen Reactions   Aspirin Other (See Comments)    Drowsy     ROS Review of Systems  Constitutional: Negative.   Eyes: Negative.   Respiratory: Negative.    Cardiovascular: Negative.   Endocrine: Negative.   Skin: Negative.   Neurological: Negative.   Psychiatric/Behavioral: Negative.       Objective:    Physical Exam HENT:     Head: Normocephalic and atraumatic.     Mouth/Throat:     Mouth: Mucous membranes are moist.  Eyes:     Extraocular Movements: Extraocular movements intact.     Conjunctiva/sclera: Conjunctivae normal.     Pupils: Pupils are equal, round, and reactive to light.  Cardiovascular:     Rate and Rhythm: Normal rate and regular rhythm.     Pulses: Normal pulses.     Heart sounds: Normal heart sounds.  Pulmonary:     Effort: Pulmonary effort is normal.     Breath sounds: Normal breath sounds.  Skin:    General: Skin is warm.  Neurological:     General: No focal deficit present.     Mental Status: She is alert and oriented to person, place, and time. Mental status is at baseline.  Psychiatric:        Mood and Affect: Mood normal.        Behavior: Behavior normal.        Thought Content: Thought content normal.        Judgment: Judgment normal.    BP 116/80 (BP Location: Right Arm, Patient Position: Sitting, Cuff Size: Large)   Pulse 80   Temp 98.4 F (36.9 C)   Resp 16   Wt 225 lb 12.8 oz (102.4 kg)   SpO2 95%   BMI 42.66 kg/m  Wt Readings from Last 3 Encounters:  06/09/21 225 lb 12.8 oz (102.4 kg)  05/27/21 227 lb 1.6 oz (103 kg)  03/03/21 225 lb 14.4 oz (102.5  kg)   Encouraged weight loss  Health Maintenance Due  Topic Date Due   COVID-19 Vaccine (1) Never done   Pneumococcal Vaccine 50-42 Years old (1 - PCV) Never done   Hepatitis C Screening  Never done   TETANUS/TDAP  Never done   Zoster  Vaccines- Shingrix (1 of 2) Never done   PAP SMEAR-Modifier  Never done   MAMMOGRAM  Never done   INFLUENZA VACCINE  05/11/2021    There are no preventive care reminders to display for this patient.  Lab Results  Component Value Date   TSH 1.070 02/25/2021   Lab Results  Component Value Date   WBC 9.4 08/06/2020   HGB 12.8 08/06/2020   HCT 38.5 08/06/2020   MCV 82 08/06/2020   PLT 374 08/06/2020   Lab Results  Component Value Date   NA 142 08/06/2020   K 4.1 08/06/2020   CO2 25 08/06/2020   GLUCOSE 113 (H) 08/06/2020   BUN 6 08/06/2020   CREATININE 0.66 08/06/2020   BILITOT 0.8 05/31/2020   ALKPHOS 59 05/31/2020   AST 28 05/31/2020   ALT 24 05/31/2020   PROT 6.5 05/31/2020   ALBUMIN 3.2 (L) 05/31/2020   CALCIUM 9.4 08/06/2020   ANIONGAP 12 05/31/2020   Lab Results  Component Value Date   CHOL 227 (H) 05/27/2021   Lab Results  Component Value Date   HDL 41 05/27/2021   Lab Results  Component Value Date   LDLCALC 158 (H) 05/27/2021   Lab Results  Component Value Date   TRIG 156 (H) 05/27/2021   Lab Results  Component Value Date   CHOLHDL 5.5 (H) 05/27/2021   Lab Results  Component Value Date   HGBA1C 6.8 (H) 05/27/2021      Assessment & Plan:    1. Type 2 diabetes mellitus without complication, without long-term current use of insulin (HCC) - Her HgbA1c was 6.8%, she will continue on current medication, low carb/non concentrated sweet diet and exercise as tolerated.  2. Elevated lipids - The 10-year ASCVD risk score Mikey Bussing DC Jr., et al., 2013) is: 3.8%   Values used to calculate the score:     Age: 17 years     Sex: Female     Is Non-Hispanic African American: No     Diabetic: Yes     Tobacco smoker:  No     Systolic Blood Pressure: 116 mmHg     Is BP treated: No     HDL Cholesterol: 41 mg/dL     Total Cholesterol: 227 mg/dL Her ASCVD risk score was 3.8%, she will continue on current medication regimen, low fat/cholesterol diet and exercise as tolerated.     Follow-up: Return in about 3 months (around 09/09/2021), or if symptoms worsen or fail to improve.    Preesha Benjamin Jerold Coombe, NP

## 2021-06-09 NOTE — Patient Instructions (Signed)
Heart-Healthy Eating Plan Heart-healthy meal planning includes: Eating less unhealthy fats. Eating more healthy fats. Making other changes in your diet. Talk with your doctor or a diet specialist (dietitian) to create an eating plan that is right for you. What is my plan? Your doctor may recommend an eating plan that includes: Total fat: ______% or less of total calories a day. Saturated fat: ______% or less of total calories a day. Cholesterol: less than _________mg a day. What are tips for following this plan? Cooking Avoid frying your food. Try to bake, boil, grill, or broil it instead. You can also reduce fat by: Removing the skin from poultry. Removing all visible fats from meats. Steaming vegetables in water or broth. Meal planning  At meals, divide your plate into four equal parts: Fill one-half of your plate with vegetables and green salads. Fill one-fourth of your plate with whole grains. Fill one-fourth of your plate with lean protein foods. Eat 4-5 servings of vegetables per day. A serving of vegetables is: 1 cup of raw or cooked vegetables. 2 cups of raw leafy greens. Eat 4-5 servings of fruit per day. A serving of fruit is: 1 medium whole fruit.  cup of dried fruit.  cup of fresh, frozen, or canned fruit.  cup of 100% fruit juice. Eat more foods that have soluble fiber. These are apples, broccoli, carrots, beans, peas, and barley. Try to get 20-30 g of fiber per day. Eat 4-5 servings of nuts, legumes, and seeds per week: 1 serving of dried beans or legumes equals  cup after being cooked. 1 serving of nuts is  cup. 1 serving of seeds equals 1 tablespoon.  General information Eat more home-cooked food. Eat less restaurant, buffet, and fast food. Limit or avoid alcohol. Limit foods that are high in starch and sugar. Avoid fried foods. Lose weight if you are overweight. Keep track of how much salt (sodium) you eat. This is important if you have high blood  pressure. Ask your doctor to tell you more about this. Try to add vegetarian meals each week. Fats Choose healthy fats. These include olive oil and canola oil, flaxseeds, walnuts, almonds, and seeds. Eat more omega-3 fats. These include salmon, mackerel, sardines, tuna, flaxseed oil, and ground flaxseeds. Try to eat fish at least 2 times each week. Check food labels. Avoid foods with trans fats or high amounts of saturated fat. Limit saturated fats. These are often found in animal products, such as meats, butter, and cream. These are also found in plant foods, such as palm oil, palm kernel oil, and coconut oil. Avoid foods with partially hydrogenated oils in them. These have trans fats. Examples are stick margarine, some tub margarines, cookies, crackers, and other baked goods. What foods can I eat? Fruits All fresh, canned (in natural juice), or frozen fruits. Vegetables Fresh or frozen vegetables (raw, steamed, roasted, or grilled). Green salads. Grains Most grains. Choose whole wheat and whole grains most of the time. Rice andpasta, including brown rice and pastas made with whole wheat. Meats and other proteins Lean, well-trimmed beef, veal, pork, and lamb. Chicken and turkey without skin. All fish and shellfish. Wild duck, rabbit, pheasant, and venison. Egg whites or low-cholesterol egg substitutes. Dried beans, peas, lentils, and tofu. Seedsand most nuts. Dairy Low-fat or nonfat cheeses, including ricotta and mozzarella. Skim or 1% milk that is liquid, powdered, or evaporated. Buttermilk that is made with low-fatmilk. Nonfat or low-fat yogurt. Fats and oils Non-hydrogenated (trans-free) margarines. Vegetable oils, including soybean, sesame,   sunflower, olive, peanut, safflower, corn, canola, and cottonseed. Salad dressings or mayonnaisemade with a vegetable oil. Beverages Mineral water. Coffee and tea. Diet carbonated beverages. Sweets and desserts Sherbet, gelatin, and fruit ice. Small  amounts of dark chocolate. Limit all sweets and desserts. Seasonings and condiments All seasonings and condiments. The items listed above may not be a complete list of foods and drinks you can eat. Contact a dietitian for more options. What foods should I avoid? Fruits Canned fruit in heavy syrup. Fruit in cream or butter sauce. Fried fruit. Limitcoconut. Vegetables Vegetables cooked in cheese, cream, or butter sauce. Fried vegetables. Grains Breads that are made with saturated or trans fats, oils, or whole milk. Croissants. Sweet rolls. Donuts. High-fat crackers,such as cheese crackers. Meats and other proteins Fatty meats, such as hot dogs, ribs, sausage, bacon, rib-eye roast or steak. High-fat deli meats, such as salami and bologna. Caviar. Domestic duck andgoose. Organ meats, such as liver. Dairy Cream, sour cream, cream cheese, and creamed cottage cheese. Whole-milk cheeses. Whole or 2% milk that is liquid, evaporated, or condensed. Whole buttermilk. Cream sauce or high-fat cheese sauce. Yogurt that is made fromwhole milk. Fats and oils Meat fat, or shortening. Cocoa butter, hydrogenated oils, palm oil, coconut oil, palm kernel oil. Solid fats and shortenings, including bacon fat, salt pork, lard, and butter. Nondairy cream substitutes. Salad dressings with cheeseor sour cream. Beverages Regular sodas and juice drinks with added sugar. Sweets and desserts Frosting. Pudding. Cookies. Cakes. Pies. Milk chocolate or white chocolate.Buttered syrups. Full-fat ice cream or ice cream drinks. The items listed above may not be a complete list of foods and drinks to avoid. Contact a dietitian for more information. Summary Heart-healthy meal planning includes eating less unhealthy fats, eating more healthy fats, and making other changes in your diet. Eat a balanced diet. This includes fruits and vegetables, low-fat or nonfat dairy, lean protein, nuts and legumes, whole grains, and heart-healthy  oils and fats. This information is not intended to replace advice given to you by your health care provider. Make sure you discuss any questions you have with your healthcare provider. Document Revised: 12/01/2017 Document Reviewed: 11/04/2017 Elsevier Patient Education  2022 [employer]. https://www.diabeteseducator.org/docs/default-source/living-with-diabetes/conquering-the-grocery-store-v1.pdf?sfvrsn=4">  Carbohydrate Counting for Diabetes Mellitus, Adult Carbohydrate counting is a method of keeping track of how many carbohydrates you eat. Eating carbohydrates naturally increases the amount of sugar (glucose) in the blood. Counting how many carbohydrates you eat improves your bloodglucose control, which helps you manage your diabetes. It is important to know how many carbohydrates you can safely have in each meal. This is different for every person. A dietitian can help you make a meal plan and calculate how many carbohydrates you should have at each meal andsnack. What foods contain carbohydrates? Carbohydrates are found in the following foods: Grains, such as breads and cereals. Dried beans and soy products. Starchy vegetables, such as potatoes, peas, and corn. Fruit and fruit juices. Milk and yogurt. Sweets and snack foods, such as cake, cookies, candy, chips, and soft drinks. How do I count carbohydrates in foods? There are two ways to count carbohydrates in food. You can read food labels or learn standard serving sizes of foods. You can use either of the methods or acombination of both. Using the Nutrition Facts label The Nutrition Facts list is included on the labels of almost all packaged foods and beverages in the [place] It includes: The serving size. Information about nutrients in each serving, including the grams (g) of carbohydrate per serving.  To use the Nutrition Facts: Decide how many servings you will have. Multiply the number of servings by the number of carbohydrates per  serving. The resulting number is the total amount of carbohydrates that you will be having. Learning the standard serving sizes of foods When you eat carbohydrate foods that are not packaged or do not include Nutrition Facts on the label, you need to measure the servings in order to count the amount of carbohydrates. Measure the foods that you will eat with a food scale or measuring cup, if needed. Decide how many standard-size servings you will eat. Multiply the number of servings by 15. For foods that contain carbohydrates, one serving equals 15 g of carbohydrates. For example, if you eat 2 cups or 10 oz (300 g) of strawberries, you will have eaten 2 servings and 30 g of carbohydrates (2 servings x 15 g = 30 g). For foods that have more than one food mixed, such as soups and casseroles, you must count the carbohydrates in each food that is included. The following list contains standard serving sizes of common carbohydrate-rich foods. Each of these servings has about 15 g of carbohydrates: 1 slice of bread. 1 six-inch (15 cm) tortilla. ? cup or 2 oz (53 g) cooked rice or pasta.  cup or 3 oz (85 g) cooked or canned, drained and rinsed beans or lentils.  cup or 3 oz (85 g) starchy vegetable, such as peas, corn, or squash.  cup or 4 oz (120 g) hot cereal.  cup or 3 oz (85 g) boiled or mashed potatoes, or  or 3 oz (85 g) of a large baked potato.  cup or 4 fl oz (118 mL) fruit juice. 1 cup or 8 fl oz (237 mL) milk. 1 small or 4 oz (106 g) apple.  or 2 oz (63 g) of a medium banana. 1 cup or 5 oz (150 g) strawberries. 3 cups or 1 oz (24 g) popped popcorn. What is an example of carbohydrate counting? To calculate the number of carbohydrates in this sample meal, follow the stepsshown below. Sample meal 3 oz (85 g) chicken breast. ? cup or 4 oz (106 g) brown rice.  cup or 3 oz (85 g) corn. 1 cup or 8 fl oz (237 mL) milk. 1 cup or 5 oz (150 g) strawberries with sugar-free whipped  topping. Carbohydrate calculation Identify the foods that contain carbohydrates: Rice. Corn. Milk. Strawberries. Calculate how many servings you have of each food: 2 servings rice. 1 serving corn. 1 serving milk. 1 serving strawberries. Multiply each number of servings by 15 g: 2 servings rice x 15 g = 30 g. 1 serving corn x 15 g = 15 g. 1 serving milk x 15 g = 15 g. 1 serving strawberries x 15 g = 15 g. Add together all of the amounts to find the total grams of carbohydrates eaten: 30 g + 15 g + 15 g + 15 g = 75 g of carbohydrates total. What are tips for following this plan? Shopping Develop a meal plan and then make a shopping list. Buy fresh and frozen vegetables, fresh and frozen fruit, dairy, eggs, beans, lentils, and whole grains. Look at food labels. Choose foods that have more fiber and less sugar. Avoid processed foods and foods with added sugars. Meal planning Aim to have the same amount of carbohydrates at each meal and for each snack time. Plan to have regular, balanced meals and snacks. Where to find more information American  Diabetes Association: www.diabetes.org Centers for Disease Control and Prevention: http://www.wolf.info/ Summary Carbohydrate counting is a method of keeping track of how many carbohydrates you eat. Eating carbohydrates naturally increases the amount of sugar (glucose) in the blood. Counting how many carbohydrates you eat improves your blood glucose control, which helps you manage your diabetes. A dietitian can help you make a meal plan and calculate how many carbohydrates you should have at each meal and snack. This information is not intended to replace advice given to you by your health care provider. Make sure you discuss any questions you have with your healthcare provider. Document Revised: 09/27/2019 Document Reviewed: 09/28/2019 Elsevier Patient Education  2021 [employer].

## 2021-09-09 ENCOUNTER — Ambulatory Visit: Payer: Self-pay | Admitting: Internal Medicine

## 2021-09-09 ENCOUNTER — Other Ambulatory Visit: Payer: Self-pay

## 2021-09-09 ENCOUNTER — Encounter: Payer: Self-pay | Admitting: Internal Medicine

## 2021-09-09 VITALS — BP 113/81 | HR 87 | Temp 97.9°F | Resp 16 | Ht 61.0 in | Wt 225.2 lb

## 2021-09-09 DIAGNOSIS — E785 Hyperlipidemia, unspecified: Secondary | ICD-10-CM

## 2021-09-09 DIAGNOSIS — E119 Type 2 diabetes mellitus without complications: Secondary | ICD-10-CM

## 2021-09-09 LAB — POCT GLYCOSYLATED HEMOGLOBIN (HGB A1C): Hemoglobin A1C: 6.9 % — AB (ref 4.0–5.6)

## 2021-09-09 LAB — GLUCOSE, POCT (MANUAL RESULT ENTRY): POC Glucose: 128 mg/dl — AB (ref 70–99)

## 2021-09-09 NOTE — Progress Notes (Signed)
Established Patient Office Visit  Subjective:  Patient ID: Lisa Howell, female    DOB: 1971-10-02  Age: 50 y.o. MRN: [id number]  CC:  Chief Complaint  Patient presents with   Follow-up   Diabetes    HPI Lisa Howell is a 50 y/o female who presents for follow-up for diabetes. Her A1c today was 6.9 and glucose was 128. Patient reports her lowest is 70-80 and patient reports feeling nauseous when the blood sugar is low. Otherwise patient has no complaints.  Past Medical History:  Diagnosis Date   Diabetes mellitus without complication ([place])    Hyperlipidemia     Past Surgical History:  Procedure Laterality Date   ABDOMINAL HYSTERECTOMY     CESAREAN SECTION     CHOLECYSTECTOMY     COLONOSCOPY WITH PROPOFOL N/A 12/30/2020   Procedure: COLONOSCOPY WITH PROPOFOL;  Surgeon: Lucilla Lame, MD;  Location: [facility] ENDOSCOPY;  Service: Endoscopy;  Laterality: N/A;    Family History  Problem Relation Age of Onset   Diabetes Mother    Heart disease Mother    Cancer Mother        "bone"   Diabetes Father    Cancer Father        colon   Diabetes Sister    Diabetes Brother    Heart disease Maternal Grandmother    Heart disease Maternal Grandfather    Dementia Paternal Grandmother    Alzheimer's disease Paternal Grandfather    Diabetes Half-Sister     Social History   Socioeconomic History   Marital status: Divorced    Spouse name: Not on file   Number of children: Not on file   Years of education: Not on file   Highest education level: Not on file  Occupational History   Not on file  Tobacco Use   Smoking status: Former    Years: 2.00    Types: Cigarettes    Quit date: 12/31/1999    Years since quitting: 21.7   Smokeless tobacco: Never  Vaping Use   Vaping Use: Never used  Substance and Sexual Activity   Alcohol use: Yes    Comment: social   Drug use: No   Sexual activity: Not on file  Other Topics Concern   Not on file  Social History Narrative    Not on file   Social Determinants of Health   Financial Resource Strain: Not on file  Food Insecurity: No Food Insecurity   Worried About Running Out of Food in the Last Year: Never true   [place] in the Last Year: Never true  Transportation Needs: No Transportation Needs   Lack of Transportation (Medical): No   Lack of Transportation (Non-Medical): No  Physical Activity: Not on file  Stress: Not on file  Social Connections: Not on file  Intimate Partner Violence: Not on file    Outpatient Medications Prior to Visit  Medication Sig Dispense Refill   blood glucose meter kit and supplies KIT Dispense based on patient and insurance preference. Use up to four times daily as directed. (FOR ICD-9 250.00, 250.01). 1 each 0   ibuprofen (ADVIL) 200 MG tablet Take 800 mg by mouth daily as needed.     metFORMIN (GLUCOPHAGE-XR) 500 MG 24 hr tablet Take 2 tablets (1,000 mg total) by mouth daily with breakfast. 60 tablet 3   pravastatin (PRAVACHOL) 40 MG tablet Take 1 tablet (40 mg total) by mouth daily. 30 tablet 2   No facility-administered medications prior  to visit.    Allergies  Allergen Reactions   Aspirin Other (See Comments)    Drowsy     ROS Review of Systems    Objective:    Physical Exam  BP 113/81 (BP Location: Left Arm, Patient Position: Sitting, Cuff Size: Large)   Pulse 87   Temp 97.9 F (36.6 C) (Oral)   Resp 16   Ht 5' 1" (1.549 m)   Wt 225 lb 3.2 oz (102.2 kg)   SpO2 94%   BMI 42.55 kg/m  Wt Readings from Last 3 Encounters:  09/09/21 225 lb 3.2 oz (102.2 kg)  06/09/21 225 lb 12.8 oz (102.4 kg)  05/27/21 227 lb 1.6 oz (103 kg)     Health Maintenance Due  Topic Date Due   COVID-19 Vaccine (1) Never done   Pneumococcal Vaccine 84-18 Years old (1 - PCV) Never done   Hepatitis C Screening  Never done   TETANUS/TDAP  Never done   Zoster Vaccines- Shingrix (1 of 2) Never done   PAP SMEAR-Modifier  Never done   MAMMOGRAM  Never done   INFLUENZA  VACCINE  Never done   FOOT EXAM  08/13/2021   URINE MICROALBUMIN  08/13/2021    There are no preventive care reminders to display for this patient.  Lab Results  Component Value Date   TSH 1.070 02/25/2021   Lab Results  Component Value Date   WBC 9.4 08/06/2020   HGB 12.8 08/06/2020   HCT 38.5 08/06/2020   MCV 82 08/06/2020   PLT 374 08/06/2020   Lab Results  Component Value Date   NA 142 08/06/2020   K 4.1 08/06/2020   CO2 25 08/06/2020   GLUCOSE 113 (H) 08/06/2020   BUN 6 08/06/2020   CREATININE 0.66 08/06/2020   BILITOT 0.8 05/31/2020   ALKPHOS 59 05/31/2020   AST 28 05/31/2020   ALT 24 05/31/2020   PROT 6.5 05/31/2020   ALBUMIN 3.2 (L) 05/31/2020   CALCIUM 9.4 08/06/2020   ANIONGAP 12 05/31/2020   Lab Results  Component Value Date   CHOL 227 (H) 05/27/2021   Lab Results  Component Value Date   HDL 41 05/27/2021   Lab Results  Component Value Date   LDLCALC 158 (H) 05/27/2021   Lab Results  Component Value Date   TRIG 156 (H) 05/27/2021   Lab Results  Component Value Date   CHOLHDL 5.5 (H) 05/27/2021   Lab Results  Component Value Date   HGBA1C 6.9 (A) 09/09/2021      Assessment & Plan:   Problem List Items Addressed This Visit       Endocrine   Type 2 diabetes mellitus without complications ([place]) - Primary   Relevant Orders   POCT HgB A1C (Completed)   POCT Glucose (CBG) (Completed)     Other   Elevated lipids    1. Type 2 diabetes mellitus without complication, without long-term current use of insulin ([facility]) Appears to be doing good on oral agent for diabetic control. A1c today in the office is less than 7. Reports checking her sugars once or twice a day in the mornings and before bedtime and readings are usually below 130. Has had some readings in the 70s where she is nauseated. Labs were not scheduled prior to follow-up today and will be done before her next visit. No change in her medical program suggested.    Follow-up: Return  in about 3 months (around 12/08/2021).    Dede Query, CMA

## 2021-09-24 ENCOUNTER — Other Ambulatory Visit: Payer: Self-pay

## 2021-09-26 IMAGING — DX DG CHEST 1V
2 series · 2 of 2 positions shown · non-contrast
Comparison: 09/16/2017

CLINICAL DATA: Vomiting, diarrhea, VXDZO-UX positive 05/18/2020

EXAM:
CHEST  1 VIEW

[chest ap (1 of 2)]
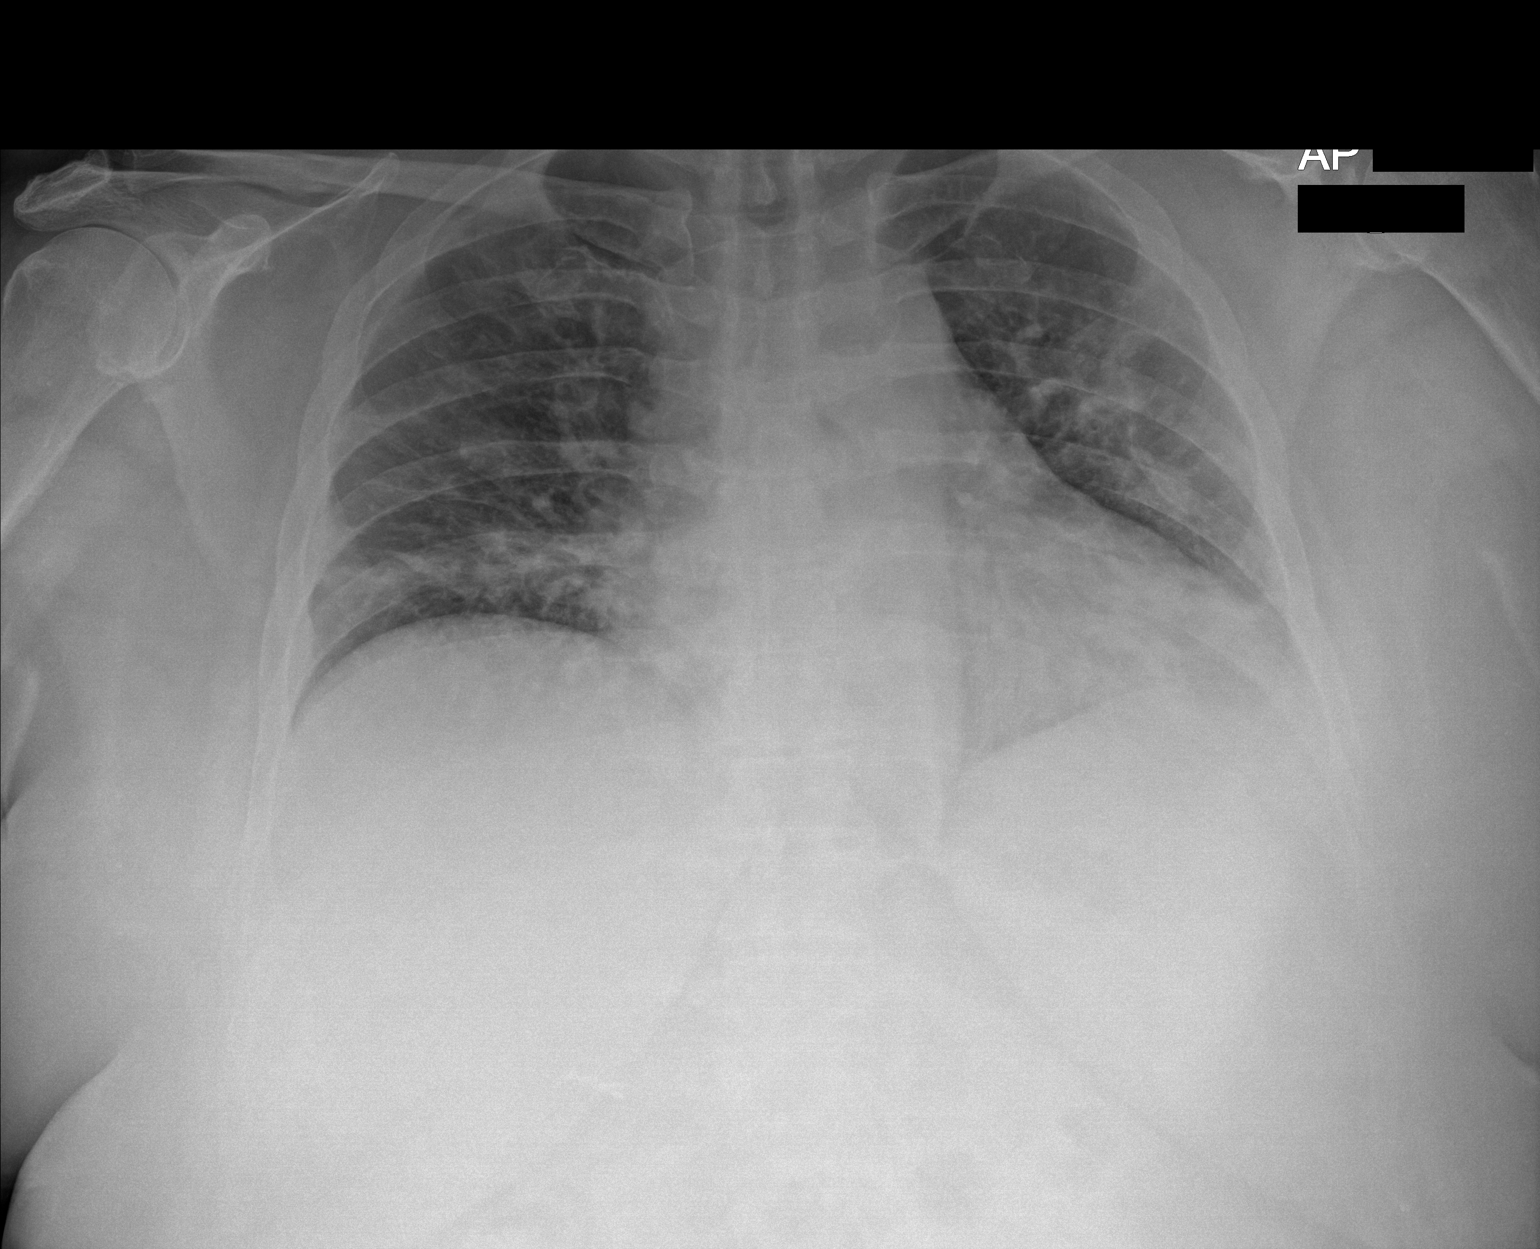

[chest ap (2 of 2)]
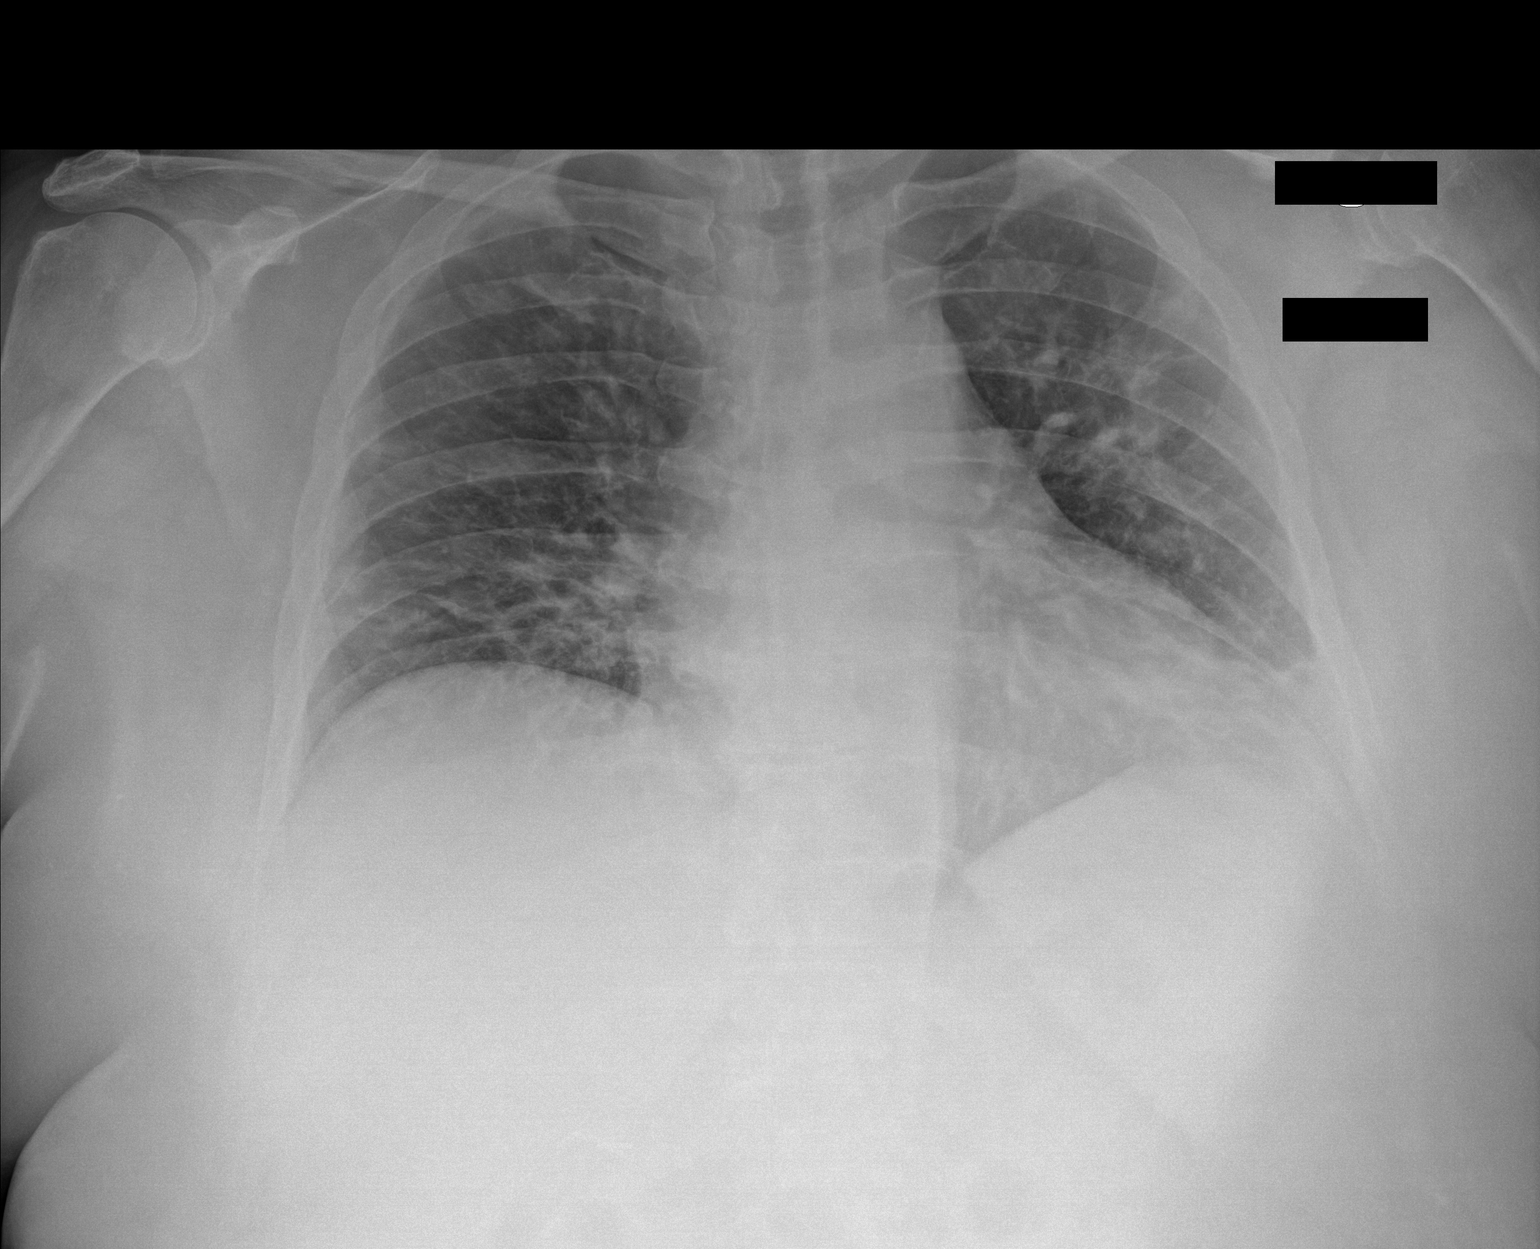

[2 of 2 positions shown; findings below may reference images not displayed]

FINDINGS: 2 frontal views of the chest demonstrate an unremarkable cardiac
silhouette. There is patchy multifocal bilateral airspace disease
greatest in the left mid and lower lung zones and right lung base.
No effusion or pneumothorax. No acute bony abnormalities.
IMPRESSION: 1. Multifocal bilateral pneumonia compatible with COVID 19.

## 2021-12-09 ENCOUNTER — Other Ambulatory Visit: Payer: Self-pay

## 2021-12-09 DIAGNOSIS — E119 Type 2 diabetes mellitus without complications: Secondary | ICD-10-CM

## 2021-12-09 DIAGNOSIS — E785 Hyperlipidemia, unspecified: Secondary | ICD-10-CM

## 2021-12-10 LAB — CBC WITH DIFFERENTIAL/PLATELET
Basophils Absolute: 0 10*3/uL (ref 0.0–0.2)
Basos: 0 %
EOS (ABSOLUTE): 0.2 10*3/uL (ref 0.0–0.4)
Eos: 2 %
Hematocrit: 39.9 % (ref 34.0–46.6)
Hemoglobin: 13 g/dL (ref 11.1–15.9)
Immature Grans (Abs): 0 10*3/uL (ref 0.0–0.1)
Immature Granulocytes: 0 %
Lymphocytes Absolute: 2.6 10*3/uL (ref 0.7–3.1)
Lymphs: 32 %
MCH: 25.3 pg — ABNORMAL LOW (ref 26.6–33.0)
MCHC: 32.6 g/dL (ref 31.5–35.7)
MCV: 78 fL — ABNORMAL LOW (ref 79–97)
Monocytes Absolute: 0.4 10*3/uL (ref 0.1–0.9)
Monocytes: 5 %
Neutrophils Absolute: 4.9 10*3/uL (ref 1.4–7.0)
Neutrophils: 61 %
Platelets: 373 10*3/uL (ref 150–450)
RBC: 5.14 x10E6/uL (ref 3.77–5.28)
RDW: 14.6 % (ref 11.7–15.4)
WBC: 8.1 10*3/uL (ref 3.4–10.8)

## 2021-12-10 LAB — COMPREHENSIVE METABOLIC PANEL
ALT: 16 IU/L (ref 0–32)
AST: 19 IU/L (ref 0–40)
Albumin/Globulin Ratio: 1.5 (ref 1.2–2.2)
Albumin: 4.3 g/dL (ref 3.8–4.9)
Alkaline Phosphatase: 124 IU/L — ABNORMAL HIGH (ref 44–121)
BUN/Creatinine Ratio: 15 (ref 9–23)
BUN: 9 mg/dL (ref 6–24)
Bilirubin Total: 0.4 mg/dL (ref 0.0–1.2)
CO2: 24 mmol/L (ref 20–29)
Calcium: 9.5 mg/dL (ref 8.7–10.2)
Chloride: 102 mmol/L (ref 96–106)
Creatinine, Ser: 0.6 mg/dL (ref 0.57–1.00)
Globulin, Total: 2.9 g/dL (ref 1.5–4.5)
Glucose: 136 mg/dL — ABNORMAL HIGH (ref 70–99)
Potassium: 4.6 mmol/L (ref 3.5–5.2)
Sodium: 142 mmol/L (ref 134–144)
Total Protein: 7.2 g/dL (ref 6.0–8.5)
eGFR: 107 mL/min/{1.73_m2} (ref >59)

## 2021-12-10 LAB — URINALYSIS, ROUTINE W REFLEX MICROSCOPIC
Bilirubin, UA: NEGATIVE
Glucose, UA: NEGATIVE
Ketones, UA: NEGATIVE
Nitrite, UA: NEGATIVE
Protein,UA: NEGATIVE
Specific Gravity, UA: 1.015 (ref 1.005–1.030)
Urobilinogen, Ur: 0.2 mg/dL (ref 0.2–1.0)
pH, UA: 6 (ref 5.0–7.5)

## 2021-12-10 LAB — TSH: TSH: 0.931 u[IU]/mL (ref 0.450–4.500)

## 2021-12-10 LAB — HEMOGLOBIN A1C
Est. average glucose Bld gHb Est-mCnc: 157 mg/dL
Hgb A1c MFr Bld: 7.1 % — ABNORMAL HIGH (ref 4.8–5.6)

## 2021-12-10 LAB — LIPID PANEL
Chol/HDL Ratio: 5 ratio — ABNORMAL HIGH (ref 0.0–4.4)
Cholesterol, Total: 254 mg/dL — ABNORMAL HIGH (ref 100–199)
HDL: 51 mg/dL (ref >39)
LDL Chol Calc (NIH): 175 mg/dL — ABNORMAL HIGH (ref 0–99)
Triglycerides: 152 mg/dL — ABNORMAL HIGH (ref 0–149)
VLDL Cholesterol Cal: 28 mg/dL (ref 5–40)

## 2021-12-10 LAB — MICROSCOPIC EXAMINATION
Casts: NONE SEEN /lpf
RBC, Urine: NONE SEEN /hpf (ref 0–2)

## 2021-12-16 ENCOUNTER — Other Ambulatory Visit: Payer: Self-pay

## 2021-12-16 ENCOUNTER — Ambulatory Visit: Payer: Self-pay | Admitting: Gerontology

## 2021-12-16 ENCOUNTER — Encounter: Payer: Self-pay | Admitting: Gerontology

## 2021-12-16 VITALS — BP 130/63 | HR 85 | Temp 98.3°F | Resp 16 | Ht 62.0 in | Wt 227.8 lb

## 2021-12-16 DIAGNOSIS — E119 Type 2 diabetes mellitus without complications: Secondary | ICD-10-CM

## 2021-12-16 DIAGNOSIS — R8290 Unspecified abnormal findings in urine: Secondary | ICD-10-CM

## 2021-12-16 DIAGNOSIS — E785 Hyperlipidemia, unspecified: Secondary | ICD-10-CM

## 2021-12-16 LAB — GLUCOSE, POCT (MANUAL RESULT ENTRY): POC Glucose: 150 mg/dl — AB (ref 70–99)

## 2021-12-16 MED ORDER — RIGHTEST GL300 LANCETS MISC
Freq: Two times a day (BID) | 0 refills | Status: AC
  Filled 2021-12-16: qty 100, 50d supply, fill #0

## 2021-12-16 MED ORDER — RIGHTEST GS550 BLOOD GLUCOSE VI STRP
ORAL_STRIP | Freq: Two times a day (BID) | 0 refills | Status: DC
  Filled 2021-12-16: qty 100, 50d supply, fill #0

## 2021-12-16 MED ORDER — PRAVASTATIN SODIUM 20 MG PO TABS
20.0000 mg | ORAL_TABLET | Freq: Every day | ORAL | 2 refills | Status: DC
  Filled 2021-12-16: qty 30, 30d supply, fill #0
  Filled 2022-01-14 – 2022-01-18 (×2): qty 30, 30d supply, fill #1
  Filled 2022-02-16: qty 30, 30d supply, fill #2

## 2021-12-16 MED ORDER — METFORMIN HCL ER 500 MG PO TB24
1000.0000 mg | ORAL_TABLET | Freq: Every day | ORAL | 2 refills | Status: DC
  Filled 2021-12-16: qty 60, 30d supply, fill #0
  Filled 2022-01-14 – 2022-01-18 (×2): qty 60, 30d supply, fill #1
  Filled 2022-02-16: qty 60, 30d supply, fill #2

## 2021-12-16 NOTE — Patient Instructions (Signed)
Heart-Healthy Eating Plan Many factors influence your heart (coronary) health, including eating and exercise habits. Coronary risk increases with abnormal blood fat (lipid) levels. Heart-healthy meal planning includes limiting unhealthy fats, increasing healthy fats, and making other diet and lifestyle changes. What is my plan? Your health care provider may recommend that you: Limit your fat intake to _________% or less of your total calories each day. Limit your saturated fat intake to _________% or less of your total calories each day. Limit the amount of cholesterol in your diet to less than _________ mg per day. What are tips for following this plan? Cooking Cook foods using methods other than frying. Baking, boiling, grilling, and broiling are all good options. Other ways to reduce fat include: Removing the skin from poultry. Removing all visible fats from meats. Steaming vegetables in water or broth. Meal planning  At meals, imagine dividing your plate into fourths: Fill one-half of your plate with vegetables and green salads. Fill one-fourth of your plate with whole grains. Fill one-fourth of your plate with lean protein foods. Eat 4-5 servings of vegetables per day. One serving equals 1 cup raw or cooked vegetable, or 2 cups raw leafy greens. Eat 4-5 servings of fruit per day. One serving equals 1 medium whole fruit,  cup dried fruit,  cup fresh, frozen, or canned fruit, or  cup 100% fruit juice. Eat more foods that contain soluble fiber. Examples include apples, broccoli, carrots, beans, peas, and barley. Aim to get 25-30 g of fiber per day. Increase your consumption of legumes, nuts, and seeds to 4-5 servings per week. One serving of dried beans or legumes equals  cup cooked, 1 serving of nuts is  cup, and 1 serving of seeds equals 1 tablespoon. Fats Choose healthy fats more often. Choose monounsaturated and polyunsaturated fats, such as olive and canola oils, flaxseeds,  walnuts, almonds, and seeds. Eat more omega-3 fats. Choose salmon, mackerel, sardines, tuna, flaxseed oil, and ground flaxseeds. Aim to eat fish at least 2 times each week. Check food labels carefully to identify foods with trans fats or high amounts of saturated fat. Limit saturated fats. These are found in animal products, such as meats, butter, and cream. Plant sources of saturated fats include palm oil, palm kernel oil, and coconut oil. Avoid foods with partially hydrogenated oils in them. These contain trans fats. Examples are stick margarine, some tub margarines, cookies, crackers, and other baked goods. Avoid fried foods. General information Eat more home-cooked food and less restaurant, buffet, and fast food. Limit or avoid alcohol. Limit foods that are high in starch and sugar. Lose weight if you are overweight. Losing just 5-10% of your body weight can help your overall health and prevent diseases such as diabetes and heart disease. Monitor your salt (sodium) intake, especially if you have high blood pressure. Talk with your health care provider about your sodium intake. Try to incorporate more vegetarian meals weekly. What foods can I eat? Fruits All fresh, canned (in natural juice), or frozen fruits. Vegetables Fresh or frozen vegetables (raw, steamed, roasted, or grilled). Green salads. Grains Most grains. Choose whole wheat and whole grains most of the time. Rice and pasta, including brown rice and pastas made with whole wheat. Meats and other proteins Lean, well-trimmed beef, veal, pork, and lamb. Chicken and turkey without skin. All fish and shellfish. Wild duck, rabbit, pheasant, and venison. Egg whites or low-cholesterol egg substitutes. Dried beans, peas, lentils, and tofu. Seeds and most nuts. Dairy Low-fat or nonfat cheeses,   including ricotta and mozzarella. Skim or 1% milk (liquid, powdered, or evaporated). Buttermilk made with low-fat milk. Nonfat or low-fat  yogurt. Fats and oils Non-hydrogenated (trans-free) margarines. Vegetable oils, including soybean, sesame, sunflower, olive, peanut, safflower, corn, canola, and cottonseed. Salad dressings or mayonnaise made with a vegetable oil. Beverages Water (mineral or sparkling). Coffee and tea. Diet carbonated beverages. Sweets and desserts Sherbet, gelatin, and fruit ice. Small amounts of dark chocolate. Limit all sweets and desserts. Seasonings and condiments All seasonings and condiments. The items listed above may not be a complete list of foods and beverages you can eat. Contact a dietitian for more options. What foods are not recommended? Fruits Canned fruit in heavy syrup. Fruit in cream or butter sauce. Fried fruit. Limit coconut. Vegetables Vegetables cooked in cheese, cream, or butter sauce. Fried vegetables. Grains Breads made with saturated or trans fats, oils, or whole milk. Croissants. Sweet rolls. Donuts. High-fat crackers, such as cheese crackers. Meats and other proteins Fatty meats, such as hot dogs, ribs, sausage, bacon, rib-eye roast or steak. High-fat deli meats, such as salami and bologna. Caviar. Domestic duck and goose. Organ meats, such as liver. Dairy Cream, sour cream, cream cheese, and creamed cottage cheese. Whole-milk cheeses. Whole or 2% milk (liquid, evaporated, or condensed). Whole buttermilk. Cream sauce or high-fat cheese sauce. Whole-milk yogurt. Fats and oils Meat fat, or shortening. Cocoa butter, hydrogenated oils, palm oil, coconut oil, palm kernel oil. Solid fats and shortenings, including bacon fat, salt pork, lard, and butter. Nondairy cream substitutes. Salad dressings with cheese or sour cream. Beverages Regular sodas and any drinks with added sugar. Sweets and desserts Frosting. Pudding. Cookies. Cakes. Pies. Milk chocolate or white chocolate. Buttered syrups. Full-fat ice cream or ice cream drinks. The items listed above may not be a complete list of  foods and beverages to avoid. Contact a dietitian for more information. Summary Heart-healthy meal planning includes limiting unhealthy fats, increasing healthy fats, and making other diet and lifestyle changes. Lose weight if you are overweight. Losing just 5-10% of your body weight can help your overall health and prevent diseases such as diabetes and heart disease. Focus on eating a balance of foods, including fruits and vegetables, low-fat or nonfat dairy, lean protein, nuts and legumes, whole grains, and heart-healthy oils and fats. This information is not intended to replace advice given to you by your health care provider. Make sure you discuss any questions you have with your health care provider. Document Revised: 02/05/2021 Document Reviewed: 02/05/2021 Elsevier Patient Education  2022 [place] for Diabetes Mellitus, Adult Carbohydrate counting is a method of keeping track of how many carbohydrates you eat. Eating carbohydrates increases the amount of sugar (glucose) in the blood. Counting how many carbohydrates you eat improves how well you manage your blood glucose. This, in turn, helps you manage your diabetes. Carbohydrates are measured in grams (g) per serving. It is important to know how many carbohydrates (in grams or by serving size) you can have in each meal. This is different for every person. A dietitian can help you make a meal plan and calculate how many carbohydrates you should have at each meal and snack. What foods contain carbohydrates? Carbohydrates are found in the following foods: Grains, such as breads and cereals. Dried beans and soy products. Starchy vegetables, such as potatoes, peas, and corn. Fruit and fruit juices. Milk and yogurt. Sweets and snack foods, such as cake, cookies, candy, chips, and soft drinks. How do I count carbohydrates  in foods? There are two ways to count carbohydrates in food. You can read food labels or learn  standard serving sizes of foods. You can use either of these methods or a combination of both. Using the Nutrition Facts label The Nutrition Facts list is included on the labels of almost all packaged foods and beverages in the [place]. It includes: The serving size. Information about nutrients in each serving, including the grams of carbohydrate per serving. To use the Nutrition Facts, decide how many servings you will have. Then, multiply the number of servings by the number of carbohydrates per serving. The resulting number is the total grams of carbohydrates that you will be having. Learning the standard serving sizes of foods When you eat carbohydrate foods that are not packaged or do not include Nutrition Facts on the label, you need to measure the servings in order to count the grams of carbohydrates. Measure the foods that you will eat with a food scale or measuring cup, if needed. Decide how many standard-size servings you will eat. Multiply the number of servings by 15. For foods that contain carbohydrates, one serving equals 15 g of carbohydrates. For example, if you eat 2 cups or 10 oz (300 g) of strawberries, you will have eaten 2 servings and 30 g of carbohydrates (2 servings x 15 g = 30 g). For foods that have more than one food mixed, such as soups and casseroles, you must count the carbohydrates in each food that is included. The following list contains standard serving sizes of common carbohydrate-rich foods. Each of these servings has about 15 g of carbohydrates: 1 slice of bread. 1 six-inch (15 cm) tortilla. ? cup or 2 oz (53 g) cooked rice or pasta.  cup or 3 oz (85 g) cooked or canned, drained and rinsed beans or lentils.  cup or 3 oz (85 g) starchy vegetable, such as peas, corn, or squash.  cup or 4 oz (120 g) hot cereal.  cup or 3 oz (85 g) boiled or mashed potatoes, or  or 3 oz (85 g) of a large baked potato.  cup or 4 fl oz (118 mL) fruit juice. 1 cup or 8  fl oz (237 mL) milk. 1 small or 4 oz (106 g) apple.  or 2 oz (63 g) of a medium banana. 1 cup or 5 oz (150 g) strawberries. 3 cups or 1 oz (28.3 g) popped popcorn. What is an example of carbohydrate counting? To calculate the grams of carbohydrates in this sample meal, follow the steps shown below. Sample meal 3 oz (85 g) chicken breast. ? cup or 4 oz (106 g) brown rice.  cup or 3 oz (85 g) corn. 1 cup or 8 fl oz (237 mL) milk. 1 cup or 5 oz (150 g) strawberries with sugar-free whipped topping. Carbohydrate calculation Identify the foods that contain carbohydrates: Rice. Corn. Milk. Strawberries. Calculate how many servings you have of each food: 2 servings rice. 1 serving corn. 1 serving milk. 1 serving strawberries. Multiply each number of servings by 15 g: 2 servings rice x 15 g = 30 g. 1 serving corn x 15 g = 15 g. 1 serving milk x 15 g = 15 g. 1 serving strawberries x 15 g = 15 g. Add together all of the amounts to find the total grams of carbohydrates eaten: 30 g + 15 g + 15 g + 15 g = 75 g of carbohydrates total. What are tips for following this plan?  Shopping Develop a meal plan and then make a shopping list. Buy fresh and frozen vegetables, fresh and frozen fruit, dairy, eggs, beans, lentils, and whole grains. Look at food labels. Choose foods that have more fiber and less sugar. Avoid processed foods and foods with added sugars. Meal planning Aim to have the same number of grams of carbohydrates at each meal and for each snack time. Plan to have regular, balanced meals and snacks. Where to find more information American Diabetes Association: diabetes.org Centers for Disease Control and Prevention: [employer].[place] Academy of Nutrition and Dietetics: eatright.org Association of Diabetes Care & Education Specialists: diabeteseducator.org Summary Carbohydrate counting is a method of keeping track of how many carbohydrates you eat. Eating carbohydrates increases the  amount of sugar (glucose) in your blood. Counting how many carbohydrates you eat improves how well you manage your blood glucose. This helps you manage your diabetes. A dietitian can help you make a meal plan and calculate how many carbohydrates you should have at each meal and snack. This information is not intended to replace advice given to you by your health care provider. Make sure you discuss any questions you have with your health care provider. Document Revised: 04/30/2020 Document Reviewed: 04/30/2020 Elsevier Patient Education  [place].

## 2021-12-16 NOTE — Progress Notes (Cosign Needed)
Established Patient Office Visit  Subjective:  Patient ID: Lisa Howell, female    DOB: 1971/01/12  Age: 52 y.o. MRN: [id number]  CC:  Chief Complaint  Patient presents with   Follow-up    Labs drawn 12/09/21    HPI Lisa Howell  is a 51 y/o female who has history of Type 2 Diabetes, Hyperlipidemia,presents for routine follow up and lab review. She states that she's compliant with her medications, denies side effects and continues to make healthy lifestyle changes. Her HgA1c  done on  12/09/21 increased from  6.9% to 7.1% and her blood glucose was 150 mg/dl when checked during visit. She reports checking her blood glucose bid, but did not check it prior to visit. She denies hypo/hyperglycemic symptoms, peripheral neuropathy and performs daily foot checks. Her Monofilament test  was normal. Urine  appearance was turbid,  trace for RBC and 2+ for leukocytes, she denies dysuria, urinary frequency, urgency, pelvic and flank pain. Her Total cholesterol  increased from 227 mg/dl to 254 mg/dl,  triglycerides decreases from 156 to 152. LDL increased from 158 mg/dl to 175 mg/dl and chol/HDL ratio from 5.5 to 5.0. She reports not taking her pravastatin. Overall,she states that she's doing well and offers no further complaint.     Past Medical History:  Diagnosis Date   Diabetes mellitus without complication ([place])    Hyperlipidemia     Past Surgical History:  Procedure Laterality Date   ABDOMINAL HYSTERECTOMY     CESAREAN SECTION     CHOLECYSTECTOMY     COLONOSCOPY WITH PROPOFOL N/A 12/30/2020   Procedure: COLONOSCOPY WITH PROPOFOL;  Surgeon: Lucilla Lame, MD;  Location: [facility] ENDOSCOPY;  Service: Endoscopy;  Laterality: N/A;    Family History  Problem Relation Age of Onset   Diabetes Mother    Heart disease Mother    Cancer Mother        "bone"   Diabetes Father    Cancer Father        colon   Diabetes Sister    Diabetes Brother    Heart disease Maternal Grandmother    Heart  disease Maternal Grandfather    Dementia Paternal Grandmother    Alzheimer's disease Paternal Grandfather    Diabetes Half-Sister     Social History   Socioeconomic History   Marital status: Divorced    Spouse name: Not on file   Number of children: Not on file   Years of education: Not on file   Highest education level: Not on file  Occupational History   Not on file  Tobacco Use   Smoking status: Former    Years: 2.00    Types: Cigarettes    Quit date: 12/31/1999    Years since quitting: 21.9   Smokeless tobacco: Never  Vaping Use   Vaping Use: Never used  Substance and Sexual Activity   Alcohol use: Yes    Comment: social   Drug use: No   Sexual activity: Not on file  Other Topics Concern   Not on file  Social History Narrative   Not on file   Social Determinants of Health   Financial Resource Strain: Not on file  Food Insecurity: No Food Insecurity   Worried About Running Out of Food in the Last Year: Never true   [place] in the Last Year: Never true  Transportation Needs: No Transportation Needs   Lack of Transportation (Medical): No   Lack of Transportation (Non-Medical): No  Physical Activity: Not on file  Stress: Not on file  Social Connections: Not on file  Intimate Partner Violence: Not on file    Outpatient Medications Prior to Visit  Medication Sig Dispense Refill   blood glucose meter kit and supplies KIT Dispense based on patient and insurance preference. Use up to four times daily as directed. (FOR ICD-9 250.00, 250.01). 1 each 0   ibuprofen (ADVIL) 200 MG tablet Take 800 mg by mouth daily as needed.     metFORMIN (GLUCOPHAGE-XR) 500 MG 24 hr tablet Take 2 tablets (1,000 mg total) by mouth daily with breakfast. 60 tablet 3   pravastatin (PRAVACHOL) 40 MG tablet Take 1 tablet (40 mg total) by mouth daily. 30 tablet 2   No facility-administered medications prior to visit.    Allergies  Allergen Reactions   Aspirin Other (See Comments)     Drowsy  Drowsy  Drowsy     ROS Review of Systems  Constitutional: Negative.   Respiratory: Negative.    Cardiovascular: Negative.   Gastrointestinal: Negative.   Endocrine: Negative.   Genitourinary: Negative.   Musculoskeletal: Negative.   Skin: Negative.   Allergic/Immunologic: Negative for food allergies (onions).  Neurological: Negative.   Psychiatric/Behavioral: Negative.       Objective:    Physical Exam Vitals reviewed.  Constitutional:      Appearance: Normal appearance.  HENT:     Head: Normocephalic.     Mouth/Throat:     Mouth: Mucous membranes are moist.  Eyes:     Extraocular Movements: Extraocular movements intact.     Conjunctiva/sclera: Conjunctivae normal.     Pupils: Pupils are equal, round, and reactive to light.  Cardiovascular:     Rate and Rhythm: Normal rate and regular rhythm.     Pulses: Normal pulses.     Heart sounds: Normal heart sounds.  Pulmonary:     Effort: Pulmonary effort is normal.     Breath sounds: Normal breath sounds.  Abdominal:     General: Bowel sounds are normal.     Palpations: Abdomen is soft.  Musculoskeletal:        General: Normal range of motion.     Cervical back: Normal range of motion.  Skin:    General: Skin is warm.  Neurological:     General: No focal deficit present.     Mental Status: She is alert and oriented to person, place, and time. Mental status is at baseline.  Psychiatric:        Mood and Affect: Mood normal.        Behavior: Behavior normal.        Thought Content: Thought content normal.        Judgment: Judgment normal.    BP 130/63 (BP Location: Right Arm, Patient Position: Sitting, Cuff Size: Large)    Pulse 85    Temp 98.3 F (36.8 C) (Oral)    Resp 16    Ht 5' 2"  (1.575 m)    Wt 227 lb 12.8 oz (103.3 kg)    SpO2 94%    BMI 41.67 kg/m  Wt Readings from Last 3 Encounters:  12/16/21 227 lb 12.8 oz (103.3 kg)  12/09/21 227 lb 1.6 oz (103 kg)  09/09/21 225 lb 3.2 oz (102.2 kg)    Encouraged weight loss  Health Maintenance Due  Topic Date Due   COVID-19 Vaccine (1) Never done   Hepatitis C Screening  Never done   TETANUS/TDAP  Never done   Zoster  Vaccines- Shingrix (1 of 2) Never done   PAP SMEAR-Modifier  Never done   MAMMOGRAM  Never done   INFLUENZA VACCINE  Never done   URINE MICROALBUMIN  08/13/2021   OPHTHALMOLOGY EXAM  11/14/2021    There are no preventive care reminders to display for this patient.  Lab Results  Component Value Date   TSH 0.931 12/09/2021   Lab Results  Component Value Date   WBC 8.1 12/09/2021   HGB 13.0 12/09/2021   HCT 39.9 12/09/2021   MCV 78 (L) 12/09/2021   PLT 373 12/09/2021   Lab Results  Component Value Date   NA 142 12/09/2021   K 4.6 12/09/2021   CO2 24 12/09/2021   GLUCOSE 136 (H) 12/09/2021   BUN 9 12/09/2021   CREATININE 0.60 12/09/2021   BILITOT 0.4 12/09/2021   ALKPHOS 124 (H) 12/09/2021   AST 19 12/09/2021   ALT 16 12/09/2021   PROT 7.2 12/09/2021   ALBUMIN 4.3 12/09/2021   CALCIUM 9.5 12/09/2021   ANIONGAP 12 05/31/2020   EGFR 107 12/09/2021   Lab Results  Component Value Date   CHOL 254 (H) 12/09/2021   Lab Results  Component Value Date   HDL 51 12/09/2021   Lab Results  Component Value Date   LDLCALC 175 (H) 12/09/2021   Lab Results  Component Value Date   TRIG 152 (H) 12/09/2021   Lab Results  Component Value Date   CHOLHDL 5.0 (H) 12/09/2021   Lab Results  Component Value Date   HGBA1C 7.1 (H) 12/09/2021      Assessment & Plan:   1. Type 2 diabetes mellitus without complication, without long-term current use of insulin (HCC) - Her HgbA1c was 7.1%, her goal should be less than 7%. She will continue on current medication, low carb/non concentrated sweet diet and exercise as tolerated. She was encouraged to check her blood glucose bid, record and bring log to follow up appointment. - POCT Glucose (CBG); Future - metFORMIN (GLUCOPHAGE-XR) 500 MG 24 hr tablet; Take 2  tablets (1,000 mg total) by mouth once daily with breakfast.  Dispense: 60 tablet; Refill: 2 - POCT Glucose (CBG) - HgB A1c; Future - Urine Microalbumin w/creat. ratio; Future - Urine Microalbumin w/creat. ratio  2. Elevated lipids -The 10-year ASCVD risk score (Arnett DK, et al., 2019) is: 4.6%   Values used to calculate the score:     Age: 32 years     Sex: Female     Is Non-Hispanic African American: No     Diabetic: Yes     Tobacco smoker: No     Systolic Blood Pressure: 244 mmHg     Is BP treated: No     HDL Cholesterol: 51 mg/dL     Total Cholesterol: 254 mg/dL Her ASCVD was 4.6%, she will continue on Pravastatin 20 mg, was educated on medication side effects and advised to notify clinic. She was encouraged to continue on low fat/cholesterol diet and exercise as tolerated. - pravastatin (PRAVACHOL) 20 MG tablet; Take 1 tablet (20 mg total) by mouth once daily.  Dispense: 30 tablet; Refill: 2 - Lipid panel; Future  3. Abnormal urinalysis - Though she was asymptomatic, will recheck Urinalysis. - UA/M w/rflx Culture, Routine; Future - UA/M w/rflx Culture, Routine      Follow-up: 03/18/22 if symptoms worsens or fail to improve.   [place] Jerold Coombe, NP

## 2021-12-17 ENCOUNTER — Other Ambulatory Visit: Payer: Self-pay | Admitting: Gerontology

## 2021-12-17 ENCOUNTER — Other Ambulatory Visit: Payer: Self-pay

## 2021-12-17 ENCOUNTER — Telehealth: Payer: Self-pay | Admitting: Emergency Medicine

## 2021-12-17 DIAGNOSIS — N390 Urinary tract infection, site not specified: Secondary | ICD-10-CM

## 2021-12-17 MED ORDER — NITROFURANTOIN MONOHYD MACRO 100 MG PO CAPS
100.0000 mg | ORAL_CAPSULE | Freq: Two times a day (BID) | ORAL | 0 refills | Status: DC
  Filled 2021-12-17: qty 10, 5d supply, fill #0

## 2021-12-17 NOTE — Telephone Encounter (Signed)
Called patient to notify that an antibiotic has been sent in to [facility] to treat UTI per Benjamine Mola, NP. Also, increase water intake. Patient agreed and voiced understanding. ?

## 2021-12-19 LAB — UA/M W/RFLX CULTURE, ROUTINE
Bilirubin, UA: NEGATIVE
Glucose, UA: NEGATIVE
Ketones, UA: NEGATIVE
Nitrite, UA: NEGATIVE
Protein,UA: NEGATIVE
Specific Gravity, UA: 1.016 (ref 1.005–1.030)
Urobilinogen, Ur: 0.2 mg/dL (ref 0.2–1.0)
pH, UA: 5.5 (ref 5.0–7.5)

## 2021-12-19 LAB — MICROSCOPIC EXAMINATION
Casts: NONE SEEN /lpf
RBC, Urine: NONE SEEN /hpf (ref 0–2)

## 2021-12-19 LAB — MICROALBUMIN / CREATININE URINE RATIO
Creatinine, Urine: 85.9 mg/dL
Microalb/Creat Ratio: 9 mg/g creat (ref 0–29)
Microalbumin, Urine: 7.7 ug/mL

## 2021-12-19 LAB — URINE CULTURE, REFLEX

## 2022-01-14 ENCOUNTER — Other Ambulatory Visit: Payer: Self-pay | Admitting: Emergency Medicine

## 2022-01-14 DIAGNOSIS — E119 Type 2 diabetes mellitus without complications: Secondary | ICD-10-CM

## 2022-01-14 MED ORDER — RIGHTEST GS550 BLOOD GLUCOSE VI STRP
ORAL_STRIP | Freq: Two times a day (BID) | 0 refills | Status: DC
  Filled 2022-01-14: qty 100, fill #0
  Filled 2022-01-28: qty 100, 50d supply, fill #0

## 2022-01-15 ENCOUNTER — Other Ambulatory Visit: Payer: Self-pay

## 2022-01-18 ENCOUNTER — Other Ambulatory Visit: Payer: Self-pay

## 2022-01-19 ENCOUNTER — Other Ambulatory Visit: Payer: Self-pay

## 2022-01-25 ENCOUNTER — Ambulatory Visit: Payer: Self-pay | Admitting: Pharmacy Technician

## 2022-01-25 DIAGNOSIS — Z79899 Other long term (current) drug therapy: Secondary | ICD-10-CM

## 2022-01-25 NOTE — Progress Notes (Signed)
Completed [facility] application and contract.  Patient agreed to all terms of the [facility] contract.   ? ?Patient approved to receive medication assistance at [facility] until time for re-certification in 2024, and as long as eligibility criteria continues to be met.   ? ?Provided patient with community resource material based on her particular needs.   ? ?Betty J. Kluttz ?Care Manager ?[facility] ?

## 2022-01-26 ENCOUNTER — Other Ambulatory Visit: Payer: Self-pay

## 2022-01-28 ENCOUNTER — Telehealth: Payer: Self-pay | Admitting: Gerontology

## 2022-01-28 ENCOUNTER — Other Ambulatory Visit: Payer: Self-pay

## 2022-01-28 NOTE — Telephone Encounter (Signed)
Called back regarding diabetes test strips. Informed patient to either call pharmacy and have them send [place] a refill request or come by the clinic to pick up free test strips.  ?

## 2022-01-29 ENCOUNTER — Other Ambulatory Visit: Payer: Self-pay

## 2022-02-12 ENCOUNTER — Other Ambulatory Visit: Payer: Self-pay

## 2022-02-16 ENCOUNTER — Other Ambulatory Visit: Payer: Self-pay

## 2022-02-18 ENCOUNTER — Other Ambulatory Visit: Payer: Self-pay

## 2022-02-18 MED ORDER — CLOTRIMAZOLE-BETAMETHASONE 1-0.05 % EX CREA
TOPICAL_CREAM | Freq: Two times a day (BID) | CUTANEOUS | 0 refills | Status: DC
  Filled 2022-02-18: qty 45, 10d supply, fill #0

## 2022-02-18 MED ORDER — DOXYCYCLINE HYCLATE 100 MG PO CAPS
100.0000 mg | ORAL_CAPSULE | Freq: Two times a day (BID) | ORAL | 0 refills | Status: DC
  Filled 2022-02-18: qty 20, 10d supply, fill #0

## 2022-02-19 ENCOUNTER — Other Ambulatory Visit: Payer: Self-pay

## 2022-03-10 ENCOUNTER — Other Ambulatory Visit: Payer: Self-pay

## 2022-03-10 DIAGNOSIS — E785 Hyperlipidemia, unspecified: Secondary | ICD-10-CM

## 2022-03-10 DIAGNOSIS — E119 Type 2 diabetes mellitus without complications: Secondary | ICD-10-CM

## 2022-03-11 LAB — LIPID PANEL
Chol/HDL Ratio: 4 ratio (ref 0.0–4.4)
Cholesterol, Total: 190 mg/dL (ref 100–199)
HDL: 48 mg/dL (ref >39)
LDL Chol Calc (NIH): 116 mg/dL — ABNORMAL HIGH (ref 0–99)
Triglycerides: 145 mg/dL (ref 0–149)
VLDL Cholesterol Cal: 26 mg/dL (ref 5–40)

## 2022-03-11 LAB — HEMOGLOBIN A1C
Est. average glucose Bld gHb Est-mCnc: 137 mg/dL
Hgb A1c MFr Bld: 6.4 % — ABNORMAL HIGH (ref 4.8–5.6)

## 2022-03-16 ENCOUNTER — Other Ambulatory Visit: Payer: Self-pay | Admitting: Gerontology

## 2022-03-16 ENCOUNTER — Other Ambulatory Visit: Payer: Self-pay

## 2022-03-16 DIAGNOSIS — E785 Hyperlipidemia, unspecified: Secondary | ICD-10-CM

## 2022-03-16 DIAGNOSIS — E119 Type 2 diabetes mellitus without complications: Secondary | ICD-10-CM

## 2022-03-16 MED ORDER — METFORMIN HCL ER 500 MG PO TB24
1000.0000 mg | ORAL_TABLET | Freq: Every day | ORAL | 2 refills | Status: DC
  Filled 2022-03-16 (×2): qty 60, 30d supply, fill #0

## 2022-03-16 MED ORDER — PRAVASTATIN SODIUM 20 MG PO TABS
20.0000 mg | ORAL_TABLET | Freq: Every day | ORAL | 2 refills | Status: DC
  Filled 2022-03-16 (×2): qty 30, 30d supply, fill #0

## 2022-03-17 ENCOUNTER — Other Ambulatory Visit: Payer: Self-pay

## 2022-03-17 ENCOUNTER — Encounter: Payer: Self-pay | Admitting: Gerontology

## 2022-03-17 ENCOUNTER — Ambulatory Visit: Payer: Self-pay | Admitting: Gerontology

## 2022-03-17 VITALS — BP 122/85 | HR 78 | Temp 97.9°F | Resp 16 | Ht 62.0 in | Wt 226.4 lb

## 2022-03-17 DIAGNOSIS — E785 Hyperlipidemia, unspecified: Secondary | ICD-10-CM

## 2022-03-17 DIAGNOSIS — Z0000 Encounter for general adult medical examination without abnormal findings: Secondary | ICD-10-CM

## 2022-03-17 DIAGNOSIS — E119 Type 2 diabetes mellitus without complications: Secondary | ICD-10-CM

## 2022-03-17 MED ORDER — METFORMIN HCL ER 500 MG PO TB24
1000.0000 mg | ORAL_TABLET | Freq: Every day | ORAL | 1 refills | Status: DC
  Filled 2022-03-17 (×2): qty 180, 90d supply, fill #0
  Filled 2022-06-13: qty 180, 90d supply, fill #1

## 2022-03-17 MED ORDER — PRAVASTATIN SODIUM 20 MG PO TABS
20.0000 mg | ORAL_TABLET | Freq: Every day | ORAL | 1 refills | Status: DC
  Filled 2022-03-17: qty 90, 90d supply, fill #0
  Filled 2022-06-13: qty 90, 90d supply, fill #1

## 2022-03-17 NOTE — Patient Instructions (Signed)
Heart-Healthy Eating Plan Heart-healthy meal planning includes: Eating less unhealthy fats. Eating more healthy fats. Making other changes in your diet. Talk with your doctor or a diet specialist (dietitian) to create an eating plan that is right for you. What is my plan? Your doctor may recommend an eating plan that includes: Total fat: ______% or less of total calories a day. Saturated fat: ______% or less of total calories a day. Cholesterol: less than _________mg a day. What are tips for following this plan? Cooking Avoid frying your food. Try to bake, boil, grill, or broil it instead. You can also reduce fat by: Removing the skin from poultry. Removing all visible fats from meats. Steaming vegetables in water or broth. Meal planning  At meals, divide your plate into four equal parts: Fill one-half of your plate with vegetables and green salads. Fill one-fourth of your plate with whole grains. Fill one-fourth of your plate with lean protein foods. Eat 4-5 servings of vegetables per day. A serving of vegetables is: 1 cup of raw or cooked vegetables. 2 cups of raw leafy greens. Eat 4-5 servings of fruit per day. A serving of fruit is: 1 medium whole fruit.  cup of dried fruit.  cup of fresh, frozen, or canned fruit.  cup of 100% fruit juice. Eat more foods that have soluble fiber. These are apples, broccoli, carrots, beans, peas, and barley. Try to get 20-30 g of fiber per day. Eat 4-5 servings of nuts, legumes, and seeds per week: 1 serving of dried beans or legumes equals  cup after being cooked. 1 serving of nuts is  cup. 1 serving of seeds equals 1 tablespoon. General information Eat more home-cooked food. Eat less restaurant, buffet, and fast food. Limit or avoid alcohol. Limit foods that are high in starch and sugar. Avoid fried foods. Lose weight if you are overweight. Keep track of how much salt (sodium) you eat. This is important if you have high blood  pressure. Ask your doctor to tell you more about this. Try to add vegetarian meals each week. Fats Choose healthy fats. These include olive oil and canola oil, flaxseeds, walnuts, almonds, and seeds. Eat more omega-3 fats. These include salmon, mackerel, sardines, tuna, flaxseed oil, and ground flaxseeds. Try to eat fish at least 2 times each week. Check food labels. Avoid foods with trans fats or high amounts of saturated fat. Limit saturated fats. These are often found in animal products, such as meats, butter, and cream. These are also found in plant foods, such as palm oil, palm kernel oil, and coconut oil. Avoid foods with partially hydrogenated oils in them. These have trans fats. Examples are stick margarine, some tub margarines, cookies, crackers, and other baked goods. What foods can I eat? Fruits All fresh, canned (in natural juice), or frozen fruits. Vegetables Fresh or frozen vegetables (raw, steamed, roasted, or grilled). Green salads. Grains Most grains. Choose whole wheat and whole grains most of the time. Rice and pasta, including brown rice and pastas made with whole wheat. Meats and other proteins Lean, well-trimmed beef, veal, pork, and lamb. Chicken and turkey without skin. All fish and shellfish. Wild duck, rabbit, pheasant, and venison. Egg whites or low-cholesterol egg substitutes. Dried beans, peas, lentils, and tofu. Seeds and most nuts. Dairy Low-fat or nonfat cheeses, including ricotta and mozzarella. Skim or 1% milk that is liquid, powdered, or evaporated. Buttermilk that is made with low-fat milk. Nonfat or low-fat yogurt. Fats and oils Non-hydrogenated (trans-free) margarines. Vegetable oils, including   soybean, sesame, sunflower, olive, peanut, safflower, corn, canola, and cottonseed. Salad dressings or mayonnaise made with a vegetable oil. Beverages Mineral water. Coffee and tea. Diet carbonated beverages. Sweets and desserts Sherbet, gelatin, and fruit ice.  Small amounts of dark chocolate. Limit all sweets and desserts. Seasonings and condiments All seasonings and condiments. The items listed above may not be a complete list of foods and drinks you can eat. Contact a dietitian for more options. What foods should I avoid? Fruits Canned fruit in heavy syrup. Fruit in cream or butter sauce. Fried fruit. Limit coconut. Vegetables Vegetables cooked in cheese, cream, or butter sauce. Fried vegetables. Grains Breads that are made with saturated or trans fats, oils, or whole milk. Croissants. Sweet rolls. Donuts. High-fat crackers, such as cheese crackers. Meats and other proteins Fatty meats, such as hot dogs, ribs, sausage, bacon, rib-eye roast or steak. High-fat deli meats, such as salami and bologna. Caviar. Domestic duck and goose. Organ meats, such as liver. Dairy Cream, sour cream, cream cheese, and creamed cottage cheese. Whole-milk cheeses. Whole or 2% milk that is liquid, evaporated, or condensed. Whole buttermilk. Cream sauce or high-fat cheese sauce. Yogurt that is made from whole milk. Fats and oils Meat fat, or shortening. Cocoa butter, hydrogenated oils, palm oil, coconut oil, palm kernel oil. Solid fats and shortenings, including bacon fat, salt pork, lard, and butter. Nondairy cream substitutes. Salad dressings with cheese or sour cream. Beverages Regular sodas and juice drinks with added sugar. Sweets and desserts Frosting. Pudding. Cookies. Cakes. Pies. Milk chocolate or white chocolate. Buttered syrups. Full-fat ice cream or ice cream drinks. The items listed above may not be a complete list of foods and drinks to avoid. Contact a dietitian for more information. Summary Heart-healthy meal planning includes eating less unhealthy fats, eating more healthy fats, and making other changes in your diet. Eat a balanced diet. This includes fruits and vegetables, low-fat or nonfat dairy, lean protein, nuts and legumes, whole grains, and  heart-healthy oils and fats. This information is not intended to replace advice given to you by your health care provider. Make sure you discuss any questions you have with your health care provider. Document Revised: 02/05/2021 Document Reviewed: 02/05/2021 Elsevier Patient Education  2022 [employer] Inc.  

## 2022-03-17 NOTE — Progress Notes (Signed)
Established Patient Office Visit  Subjective   Patient ID: Lisa Howell, female    DOB: 08-25-1971  Age: 51 y.o. MRN: [id number]  Chief Complaint  Patient presents with   Follow-up    Labs drawn 03/10/22   Diabetes   Hyperlipidemia    HPI  Lisa Howell  is a 51 y/o female who has history of Type 2 Diabetes, Hyperlipidemia,presents for routine follow up and lab review. His HgbA1c done on 03/10/22 decreased from 7.1% to 6.4%.  She denies hypo/hyperglycemic symptoms, peripheral neuropathy and performs daily foot checks. She states that she is compliant with her medications, denies side effects and continues to make healthy lifestyle changes. Adheres to ADA diet.  She states that she will schedule an appointment for her mammogram and Pap smear.  Overall, she states that she is doing well and offers no further complaint.  Review of Systems  Constitutional: Negative.   Eyes: Negative.   Respiratory: Negative.    Cardiovascular: Negative.   Neurological: Negative.   Psychiatric/Behavioral: Negative.       Objective:     BP 122/85 (BP Location: Right Arm, Patient Position: Sitting, Cuff Size: Large)   Pulse 78   Temp 97.9 F (36.6 C) (Oral)   Resp 16   Ht 5' 2"  (1.575 m)   Wt 226 lb 6.4 oz (102.7 kg)   SpO2 95%   BMI 41.41 kg/m  BP Readings from Last 3 Encounters:  03/17/22 122/85  03/10/22 128/84  12/16/21 130/63   Wt Readings from Last 3 Encounters:  03/17/22 226 lb 6.4 oz (102.7 kg)  03/10/22 227 lb 11.2 oz (103.3 kg)  12/16/21 227 lb 12.8 oz (103.3 kg)    Encouraged weight loss  Physical Exam HENT:     Head: Normocephalic and atraumatic.     Mouth/Throat:     Mouth: Mucous membranes are moist.  Eyes:     Extraocular Movements: Extraocular movements intact.     Conjunctiva/sclera: Conjunctivae normal.     Pupils: Pupils are equal, round, and reactive to light.  Cardiovascular:     Rate and Rhythm: Normal rate and regular rhythm.     Pulses: Normal  pulses.     Heart sounds: Normal heart sounds.  Pulmonary:     Effort: Pulmonary effort is normal.     Breath sounds: Normal breath sounds.  Skin:    General: Skin is warm.  Neurological:     General: No focal deficit present.     Mental Status: She is alert and oriented to person, place, and time. Mental status is at baseline.  Psychiatric:        Mood and Affect: Mood normal.        Behavior: Behavior normal.        Thought Content: Thought content normal.        Judgment: Judgment normal.     No results found for any visits on 03/17/22.  Last CBC Lab Results  Component Value Date   WBC 8.1 12/09/2021   HGB 13.0 12/09/2021   HCT 39.9 12/09/2021   MCV 78 (L) 12/09/2021   MCH 25.3 (L) 12/09/2021   RDW 14.6 12/09/2021   PLT 373 72/06/4708   Last metabolic panel Lab Results  Component Value Date   GLUCOSE 136 (H) 12/09/2021   NA 142 12/09/2021   K 4.6 12/09/2021   CL 102 12/09/2021   CO2 24 12/09/2021   BUN 9 12/09/2021   CREATININE 0.60 12/09/2021   EGFR  107 12/09/2021   CALCIUM 9.5 12/09/2021   PROT 7.2 12/09/2021   ALBUMIN 4.3 12/09/2021   LABGLOB 2.9 12/09/2021   AGRATIO 1.5 12/09/2021   BILITOT 0.4 12/09/2021   ALKPHOS 124 (H) 12/09/2021   AST 19 12/09/2021   ALT 16 12/09/2021   ANIONGAP 12 05/31/2020   Last lipids Lab Results  Component Value Date   CHOL 190 03/10/2022   HDL 48 03/10/2022   LDLCALC 116 (H) 03/10/2022   TRIG 145 03/10/2022   CHOLHDL 4.0 03/10/2022   Last hemoglobin A1c Lab Results  Component Value Date   HGBA1C 6.4 (H) 03/10/2022      The 10-year ASCVD risk score (Arnett DK, et al., 2019) is: 3.1%    Assessment & Plan:   1. Type 2 diabetes mellitus without complication, without long-term current use of insulin (HCC) -Her hemoglobin A1c was 6.4%, she will continue on current medication, low carbohydrate/no concentrated sweet diet and exercise as tolerated. - metFORMIN (GLUCOPHAGE-XR) 500 MG 24 hr tablet; Take 2 tablets  (1,000 mg total) by mouth once daily with breakfast.  Dispense: 180 tablet; Refill: 1  2. Elevated lipids -Her ASCVD risk factor was 3.4%, she will continue on current medication, low-fat/cholesterol diet and exercise as tolerated. - pravastatin (PRAVACHOL) 20 MG tablet; Take 1 tablet (20 mg total) by mouth once daily.  Dispense: 90 tablet; Refill: 1  3. Health care maintenance -She was encouraged to schedule for her mammogram and Pap smear.   Return in about 26 weeks (around 09/15/2022), or if symptoms worsen or fail to improve.    Torence Palmeri Jerold Coombe, NP

## 2022-06-01 ENCOUNTER — Other Ambulatory Visit: Payer: Self-pay

## 2022-06-01 ENCOUNTER — Other Ambulatory Visit: Payer: Self-pay | Admitting: Gerontology

## 2022-06-01 DIAGNOSIS — E119 Type 2 diabetes mellitus without complications: Secondary | ICD-10-CM

## 2022-06-01 MED FILL — Glucose Blood Test Strip: 50 days supply | Qty: 100 | Fill #0 | Status: AC

## 2022-06-03 ENCOUNTER — Other Ambulatory Visit: Payer: Self-pay | Admitting: Gerontology

## 2022-06-03 ENCOUNTER — Other Ambulatory Visit: Payer: Self-pay

## 2022-06-03 DIAGNOSIS — E119 Type 2 diabetes mellitus without complications: Secondary | ICD-10-CM

## 2022-06-04 ENCOUNTER — Other Ambulatory Visit: Payer: Self-pay

## 2022-06-14 ENCOUNTER — Other Ambulatory Visit: Payer: Self-pay

## 2022-06-15 ENCOUNTER — Other Ambulatory Visit: Payer: Self-pay

## 2022-09-08 ENCOUNTER — Other Ambulatory Visit: Payer: Self-pay | Admitting: Gerontology

## 2022-09-08 DIAGNOSIS — E785 Hyperlipidemia, unspecified: Secondary | ICD-10-CM

## 2022-09-08 DIAGNOSIS — E119 Type 2 diabetes mellitus without complications: Secondary | ICD-10-CM

## 2022-09-09 ENCOUNTER — Other Ambulatory Visit: Payer: Self-pay

## 2022-09-09 MED ORDER — METFORMIN HCL ER 500 MG PO TB24
1000.0000 mg | ORAL_TABLET | Freq: Every day | ORAL | 0 refills | Status: DC
  Filled 2022-09-09: qty 60, 30d supply, fill #0

## 2022-09-09 MED ORDER — PRAVASTATIN SODIUM 20 MG PO TABS
20.0000 mg | ORAL_TABLET | Freq: Every day | ORAL | 0 refills | Status: DC
  Filled 2022-09-09: qty 30, 30d supply, fill #0

## 2022-09-15 ENCOUNTER — Encounter: Payer: Self-pay | Admitting: Gerontology

## 2022-09-15 ENCOUNTER — Ambulatory Visit: Payer: Self-pay | Admitting: Gerontology

## 2022-09-15 ENCOUNTER — Other Ambulatory Visit: Payer: Self-pay

## 2022-09-15 VITALS — BP 135/90 | HR 88 | Temp 97.5°F | Resp 17 | Ht 61.0 in | Wt 226.7 lb

## 2022-09-15 DIAGNOSIS — E785 Hyperlipidemia, unspecified: Secondary | ICD-10-CM

## 2022-09-15 DIAGNOSIS — R062 Wheezing: Secondary | ICD-10-CM

## 2022-09-15 DIAGNOSIS — E119 Type 2 diabetes mellitus without complications: Secondary | ICD-10-CM

## 2022-09-15 DIAGNOSIS — R051 Acute cough: Secondary | ICD-10-CM

## 2022-09-15 DIAGNOSIS — R059 Cough, unspecified: Secondary | ICD-10-CM | POA: Insufficient documentation

## 2022-09-15 LAB — POCT GLYCOSYLATED HEMOGLOBIN (HGB A1C): Hemoglobin A1C: 6.9 % — AB (ref 4.0–5.6)

## 2022-09-15 LAB — GLUCOSE, POCT (MANUAL RESULT ENTRY): POC Glucose: 109 mg/dl — AB (ref 70–99)

## 2022-09-15 MED ORDER — AMOXICILLIN-POT CLAVULANATE 875-125 MG PO TABS
1.0000 | ORAL_TABLET | Freq: Two times a day (BID) | ORAL | 0 refills | Status: DC
  Filled 2022-09-15: qty 10, 5d supply, fill #0

## 2022-09-15 MED ORDER — METFORMIN HCL ER 500 MG PO TB24: 1000.0000 mg | ORAL_TABLET | Freq: Every day | ORAL | 0 refills | Status: DC

## 2022-09-15 MED ORDER — PREDNISONE 10 MG PO TABS
10.0000 mg | ORAL_TABLET | Freq: Every day | ORAL | 0 refills | Status: DC
  Filled 2022-09-15: qty 5, 5d supply, fill #0

## 2022-09-15 MED ORDER — ALBUTEROL SULFATE HFA 108 (90 BASE) MCG/ACT IN AERS
1.0000 | INHALATION_SPRAY | Freq: Four times a day (QID) | RESPIRATORY_TRACT | 0 refills | Status: DC | PRN
  Filled 2022-09-15: qty 8.5, 25d supply, fill #0

## 2022-09-15 MED ORDER — PRAVASTATIN SODIUM 20 MG PO TABS: 20.0000 mg | ORAL_TABLET | Freq: Every day | ORAL | 0 refills | Status: DC

## 2022-09-15 NOTE — Progress Notes (Signed)
Established Patient Office Visit  Subjective   Patient ID: Lisa Howell, female    DOB: 26-Dec-1970  Age: 51 y.o. MRN: [id number]  Chief Complaint  Patient presents with   Follow-up   Diabetes   Cough    Patient c/o cough x 2.5 weeks. Patient has not taken a covid test.    HPI  Lisa Howell  is a 51 y/o female who has history of Type 2 Diabetes, Hyperlipidemia,presents for routine follow up, lab review and medication refill. Her HgbA1c done during visit increased from 6.4% to 6.9% and her blood glucose was 109 mg/dl. She c/o non productive cough that has been going on for 21/2 weeks that has worsened. She c/o left sided thoracic pain that started 3 days ago after having a coughing spell. She states that cough is associated with shortness of breath, wheezing, and pain with coughing. She denies fever, chills, sore throat and has not tested for Covid. She reports taking Mucinex, Tylenol sinus with no relif.  She states that she's not feeling good and offers no further complaint.  Review of Systems  Constitutional: Negative.   HENT:  Negative for congestion, sinus pain and sore throat.   Respiratory:  Positive for cough, sputum production, shortness of breath and wheezing.   Cardiovascular: Negative.   Skin: Negative.   Neurological: Negative.   Psychiatric/Behavioral: Negative.        Objective:     BP (!) 135/90 (BP Location: Right Arm, Patient Position: Sitting, Cuff Size: Large)   Pulse 88   Temp (!) 97.5 F (36.4 C)   Resp 17   Ht _0  (1.549 m)   Wt 226 lb 11.2 oz (102.8 kg)   SpO2 94%   BMI 42.83 kg/m  BP Readings from Last 3 Encounters:  09/15/22 (!) 135/90  03/17/22 122/85  03/10/22 128/84   Wt Readings from Last 3 Encounters:  09/15/22 226 lb 11.2 oz (102.8 kg)  03/17/22 226 lb 6.4 oz (102.7 kg)  03/10/22 227 lb 11.2 oz (103.3 kg)      Physical Exam HENT:     Head: Normocephalic and atraumatic.  Cardiovascular:     Rate and Rhythm: Normal  rate and regular rhythm.     Pulses: Normal pulses.     Heart sounds: Normal heart sounds.  Pulmonary:     Breath sounds: Examination of the right-upper field reveals wheezing and rhonchi. Examination of the left-upper field reveals wheezing and rhonchi. Examination of the right-middle field reveals wheezing and rhonchi. Examination of the left-middle field reveals wheezing and rhonchi. Examination of the right-lower field reveals wheezing and rhonchi. Examination of the left-lower field reveals wheezing and rhonchi. Wheezing and rhonchi present.  Skin:    General: Skin is warm.  Neurological:     General: No focal deficit present.     Mental Status: She is alert and oriented to person, place, and time.  Psychiatric:        Mood and Affect: Mood normal.        Behavior: Behavior normal.      Results for orders placed or performed in visit on 09/15/22  POCT Glucose (CBG)  Result Value Ref Range   POC Glucose 109 (A) 70 - 99 mg/dl  POCT HgB A1C  Result Value Ref Range   Hemoglobin A1C 6.9 (A) 4.0 - 5.6 %   HbA1c POC (<> result, manual entry)     HbA1c, POC (prediabetic range)     HbA1c, POC (controlled  diabetic range)      Last CBC Lab Results  Component Value Date   WBC 8.1 12/09/2021   HGB 13.0 12/09/2021   HCT 39.9 12/09/2021   MCV 78 (L) 12/09/2021   MCH 25.3 (L) 12/09/2021   RDW 14.6 12/09/2021   PLT 373 16/07/9603   Last metabolic panel Lab Results  Component Value Date   GLUCOSE 136 (H) 12/09/2021   NA 142 12/09/2021   K 4.6 12/09/2021   CL 102 12/09/2021   CO2 24 12/09/2021   BUN 9 12/09/2021   CREATININE 0.60 12/09/2021   EGFR 107 12/09/2021   CALCIUM 9.5 12/09/2021   PROT 7.2 12/09/2021   ALBUMIN 4.3 12/09/2021   LABGLOB 2.9 12/09/2021   AGRATIO 1.5 12/09/2021   BILITOT 0.4 12/09/2021   ALKPHOS 124 (H) 12/09/2021   AST 19 12/09/2021   ALT 16 12/09/2021   ANIONGAP 12 05/31/2020   Last lipids Lab Results  Component Value Date   CHOL 190  03/10/2022   HDL 48 03/10/2022   LDLCALC 116 (H) 03/10/2022   TRIG 145 03/10/2022   CHOLHDL 4.0 03/10/2022   Last hemoglobin A1c Lab Results  Component Value Date   HGBA1C 6.9 (A) 09/15/2022   Last thyroid functions Lab Results  Component Value Date   TSH 0.931 12/09/2021      The 10-year ASCVD risk score (Arnett DK, et al., 2019) is: 3.8%    Assessment & Plan:    1. Type 2 diabetes mellitus without complication, without long-term current use of insulin (HCC) - Her HgbA1c was 6.9%, she will continue on current medication, low carb/non concentrated sweet diet and exercise as tolerated. - POCT Glucose (CBG) - POCT HgB A1C - metFORMIN (GLUCOPHAGE-XR) 500 MG 24 hr tablet; Take 2 tablets (1,000 mg total) by mouth once daily with breakfast.  Dispense: 60 tablet; Refill: 0  2. Elevated lipids - She will continue on current medication, low fat/cholesterol diet and exercise as tolerated. - pravastatin (PRAVACHOL) 20 MG tablet; Take 1 tablet (20 mg total) by mouth once daily.  Dispense: 30 tablet; Refill: 0  3. Acute cough - Likely viral, Bronchitis, provided Covid home test  kit and advised her to test and notify clinic with result. She was encouraged to continue taking otc Mucinex and increase fluid intake.  4. Wheezing - Possible Bronchitis, Covid 19. Wheezing and rhonchi to lung fields, unable to get chest x ray nor labs. Will treat prophylactically due to her comorbidity with prednisone and Augmentin.She was educated on medication side effects and advised to notify clinic. She was advised to go to the ED for worsening symptoms. - predniSONE (DELTASONE) 10 MG tablet; Take 1 tablet (10 mg total) by mouth daily with breakfast.  Dispense: 5 tablet; Refill: 0 - amoxicillin-clavulanate (AUGMENTIN) 875-125 MG tablet; Take 1 tablet by mouth 2 (two) times daily.  Dispense: 10 tablet; Refill: 0 - albuterol (VENTOLIN HFA) 108 (90 Base) MCG/ACT inhaler; Inhale 1-2 puffs into the lungs every 6  (six) hours as needed for wheezing or shortness of breath.  Dispense: 8.5 g; Refill: 0   Return in about 4 weeks (around 10/13/2022), or if symptoms worsen or fail to improve.    Annaliese Saez Jerold Coombe, NP

## 2022-09-15 NOTE — Patient Instructions (Signed)
Carbohydrate Counting for Diabetes Mellitus, Adult Carbohydrate counting is a method of keeping track of how many carbohydrates you eat. Eating carbohydrates increases the amount of sugar (glucose) in the blood. Counting how many carbohydrates you eat improves how well you manage your blood glucose. This, in turn, helps you manage your diabetes. Carbohydrates are measured in grams (g) per serving. It is important to know how many carbohydrates (in grams or by serving size) you can have in each meal. This is different for every person. A dietitian can help you make a meal plan and calculate how many carbohydrates you should have at each meal and snack. What foods contain carbohydrates? Carbohydrates are found in the following foods: Grains, such as breads and cereals. Dried beans and soy products. Starchy vegetables, such as potatoes, peas, and corn. Fruit and fruit juices. Milk and yogurt. Sweets and snack foods, such as cake, cookies, candy, chips, and soft drinks. How do I count carbohydrates in foods? There are two ways to count carbohydrates in food. You can read food labels or learn standard serving sizes of foods. You can use either of these methods or a combination of both. Using the Nutrition Facts label The Nutrition Facts list is included on the labels of almost all packaged foods and beverages in the [place]. It includes: The serving size. Information about nutrients in each serving, including the grams of carbohydrate per serving. To use the Nutrition Facts, decide how many servings you will have. Then, multiply the number of servings by the number of carbohydrates per serving. The resulting number is the total grams of carbohydrates that you will be having. Learning the standard serving sizes of foods When you eat carbohydrate foods that are not packaged or do not include Nutrition Facts on the label, you need to measure the servings in order to count the grams of  carbohydrates. Measure the foods that you will eat with a food scale or measuring cup, if needed. Decide how many standard-size servings you will eat. Multiply the number of servings by 15. For foods that contain carbohydrates, one serving equals 15 g of carbohydrates. For example, if you eat 2 cups or 10 oz (300 g) of strawberries, you will have eaten 2 servings and 30 g of carbohydrates (2 servings x 15 g = 30 g). For foods that have more than one food mixed, such as soups and casseroles, you must count the carbohydrates in each food that is included. The following list contains standard serving sizes of common carbohydrate-rich foods. Each of these servings has about 15 g of carbohydrates: 1 slice of bread. 1 six-inch (15 cm) tortilla. ? cup or 2 oz (53 g) cooked rice or pasta.  cup or 3 oz (85 g) cooked or canned, drained and rinsed beans or lentils.  cup or 3 oz (85 g) starchy vegetable, such as peas, corn, or squash.  cup or 4 oz (120 g) hot cereal.  cup or 3 oz (85 g) boiled or mashed potatoes, or  or 3 oz (85 g) of a large baked potato.  cup or 4 fl oz (118 mL) fruit juice. 1 cup or 8 fl oz (237 mL) milk. 1 small or 4 oz (106 g) apple.  or 2 oz (63 g) of a medium banana. 1 cup or 5 oz (150 g) strawberries. 3 cups or 1 oz (28.3 g) popped popcorn. What is an example of carbohydrate counting? To calculate the grams of carbohydrates in this sample meal, follow the steps  shown below. Sample meal 3 oz (85 g) chicken breast. ? cup or 4 oz (106 g) brown rice.  cup or 3 oz (85 g) corn. 1 cup or 8 fl oz (237 mL) milk. 1 cup or 5 oz (150 g) strawberries with sugar-free whipped topping. Carbohydrate calculation Identify the foods that contain carbohydrates: Rice. Corn. Milk. Strawberries. Calculate how many servings you have of each food: 2 servings rice. 1 serving corn. 1 serving milk. 1 serving strawberries. Multiply each number of servings by 15 g: 2 servings rice x 15  g = 30 g. 1 serving corn x 15 g = 15 g. 1 serving milk x 15 g = 15 g. 1 serving strawberries x 15 g = 15 g. Add together all of the amounts to find the total grams of carbohydrates eaten: 30 g + 15 g + 15 g + 15 g = 75 g of carbohydrates total. What are tips for following this plan? Shopping Develop a meal plan and then make a shopping list. Buy fresh and frozen vegetables, fresh and frozen fruit, dairy, eggs, beans, lentils, and whole grains. Look at food labels. Choose foods that have more fiber and less sugar. Avoid processed foods and foods with added sugars. Meal planning Aim to have the same number of grams of carbohydrates at each meal and for each snack time. Plan to have regular, balanced meals and snacks. Where to find more information American Diabetes Association: diabetes.org Centers for Disease Control and Prevention: StoreMirror.com.[place] Academy of Nutrition and Dietetics: eatright.org Association of Diabetes Care & Education Specialists: diabeteseducator.org Summary Carbohydrate counting is a method of keeping track of how many carbohydrates you eat. Eating carbohydrates increases the amount of sugar (glucose) in your blood. Counting how many carbohydrates you eat improves how well you manage your blood glucose. This helps you manage your diabetes. A dietitian can help you make a meal plan and calculate how many carbohydrates you should have at each meal and snack. This information is not intended to replace advice given to you by your health care provider. Make sure you discuss any questions you have with your health care provider. Document Revised: 04/30/2020 Document Reviewed: 04/30/2020 Elsevier Patient Education  [place]. Cough, Adult Coughing is a reflex that clears your throat and your airways (respiratory system). Coughing helps to heal and protect your lungs. It is normal to cough occasionally, but a cough that happens with other symptoms or lasts a long time may  be a sign of a condition that needs treatment. An acute cough may only last 2-3 weeks, while a chronic cough may last 8 or more weeks. Coughing is commonly caused by: Infection of the respiratory systemby viruses or bacteria. Breathing in substances that irritate your lungs. Allergies. Asthma. Mucus that runs down the back of your throat (postnasal drip). Smoking. Acid backing up from the stomach into the esophagus (gastroesophageal reflux). Certain medicines. Chronic lung problems. Other medical conditions such as heart failure or a blood clot in the lung (pulmonary embolism). Follow these instructions at home: Medicines Take over-the-counter and prescription medicines only as told by your health care provider. Talk with your health care provider before you take a cough suppressant medicine. Lifestyle Avoid cigarette smoke. Do not use any products that contain nicotine or tobacco, such as cigarettes, e-cigarettes, and chewing tobacco. If you need help quitting, ask your health care provider. Drink enough fluid to keep your urine pale yellow. Avoid caffeine. Do not drink alcohol if your health care provider  tells you not to drink. General instructions  Pay close attention to changes in your cough. Tell your health care provider about them. Always cover your mouth when you cough. Avoid things that make you cough, such as perfume, candles, cleaning products, or campfire or tobacco smoke. If the air is dry, use a cool mist vaporizer or humidifier in your bedroom or your home to help loosen secretions. If your cough is worse at night, try to sleep in a semi-upright position. Rest as needed. Keep all follow-up visits as told by your health care provider. This is important. Contact a health care provider if you: Have new symptoms. Cough up pus. Have a cough that does not get better after 2-3 weeks or gets worse. Cannot control your cough with cough suppressant medicines and you are losing  sleep. Have pain that gets worse or pain that is not helped with medicine. Have a fever. Have unexplained weight loss. Have night sweats. Get help right away if: You cough up blood. You have difficulty breathing. Your heartbeat is very fast. These symptoms may represent a serious problem that is an emergency. Do not wait to see if the symptoms will go away. Get medical help right away. Call your local emergency services (911 in the [place] Do not drive yourself to the hospital. Summary Coughing is a reflex that clears your throat and your airways. It is normal to cough occasionally, but a cough that happens with other symptoms or lasts a long time may be a sign of a condition that needs treatment. Take over-the-counter and prescription medicines only as told by your health care provider. Always cover your mouth when you cough. Contact a health care provider if you have new symptoms or a cough that does not get better after 2-3 weeks or gets worse. This information is not intended to replace advice given to you by your health care provider. Make sure you discuss any questions you have with your health care provider. Document Revised: 03/02/2022 Document Reviewed: 10/16/2018 Elsevier Patient Education  [place].

## 2022-09-16 ENCOUNTER — Other Ambulatory Visit: Payer: Self-pay

## 2022-09-16 ENCOUNTER — Emergency Department: Payer: Self-pay

## 2022-09-16 ENCOUNTER — Emergency Department
Admission: EM | Admit: 2022-09-16 | Discharge: 2022-09-16 | Disposition: A | Discharge status: Home / Self Care | Payer: Self-pay | Attending: Emergency Medicine | Admitting: Emergency Medicine

## 2022-09-16 DIAGNOSIS — Z20822 Contact with and (suspected) exposure to covid-19: Secondary | ICD-10-CM | POA: Insufficient documentation

## 2022-09-16 DIAGNOSIS — J42 Unspecified chronic bronchitis: Secondary | ICD-10-CM | POA: Insufficient documentation

## 2022-09-16 DIAGNOSIS — E119 Type 2 diabetes mellitus without complications: Secondary | ICD-10-CM | POA: Insufficient documentation

## 2022-09-16 DIAGNOSIS — J069 Acute upper respiratory infection, unspecified: Secondary | ICD-10-CM | POA: Insufficient documentation

## 2022-09-16 LAB — RESP PANEL BY RT-PCR (RSV, FLU A&B, COVID)  RVPGX2
Influenza A by PCR: NEGATIVE
Influenza B by PCR: NEGATIVE
Resp Syncytial Virus by PCR: NEGATIVE
SARS Coronavirus 2 by RT PCR: NEGATIVE

## 2022-09-16 MED ORDER — IPRATROPIUM-ALBUTEROL 0.5-2.5 (3) MG/3ML IN SOLN
3.0000 mL | Freq: Once | RESPIRATORY_TRACT | Status: AC
  Administered 2022-09-16: 3 mL via RESPIRATORY_TRACT
  Filled 2022-09-16: qty 3

## 2022-09-16 MED ORDER — BENZONATATE 200 MG PO CAPS
200.0000 mg | ORAL_CAPSULE | Freq: Three times a day (TID) | ORAL | 0 refills | Status: DC | PRN
  Filled 2022-09-16: qty 30, 10d supply, fill #0

## 2022-09-16 NOTE — ED Triage Notes (Signed)
Cough for 3 weeks, saw Dr yesterday and prescribed abx for infection, not sure what type. Pt felt a pop in Left lung area posterior after raising arm to put shirt on and coughed. Pt states a 10/10 pain. Pt states it hurts to move arm, pain is mostly in the back lung area. Pt states some SOB, denies CP.

## 2022-09-16 NOTE — ED Provider Notes (Signed)
[facility] Provider Note    Event Date/Time   First MD Initiated Contact with Patient 09/16/22 1622     (approximate)   History   Cough   HPI  Lisa Howell is a 51 y.o. female   presents to the ED with complaint of left lateral rib pain that occurred this morning.  Patient states she was putting on her shirt with her arm up in the air when she coughed feeling a popping sensation in her left rib.  Patient was seen by her PCP yesterday and prescribed antibiotics, prednisone and an inhaler.  Patient has had upper respiratory symptoms for several days and has been taking over-the-counter medication for this complaint.  Currently she denies any chest pain or shortness of breath.  Patient has history of type 2 diabetes and chronic tension headaches.      Physical Exam   Triage Vital Signs: ED Triage Vitals  Enc Vitals Group     BP 09/16/22 1534 (!) 164/89     Pulse Rate 09/16/22 1534 94     Resp 09/16/22 1534 18     Temp 09/16/22 1534 98.5 F (36.9 C)     Temp Source 09/16/22 1534 Oral     SpO2 09/16/22 1534 96 %     Weight 09/16/22 1535 226 lb 14.4 oz (102.9 kg)     Height 09/16/22 1535 '5\' 1"'$  (1.549 m)     Head Circumference --      Peak Flow --      Pain Score 09/16/22 1535 10     Pain Loc --      Pain Edu? --      Excl. in [place]? --     Most recent vital signs: Vitals:   09/16/22 1534  BP: (!) 164/89  Pulse: 94  Resp: 18  Temp: 98.5 F (36.9 C)  SpO2: 96%     General: Awake, no distress.  Able to talk in complete sentences without any difficulty. CV:  Good peripheral perfusion.  Heart regular rate and rhythm. Resp:  Normal effort.  Lungs with occasional rales with congested cough but no wheezing was noted bilaterally.  Tender left lateral rib approximately fifth and sixth area. Abd:  No distention.  Soft, nontender. Other:     ED Results / Procedures / Treatments   Labs (all labs ordered are listed, but only abnormal results  are displayed) Labs Reviewed  RESP PANEL BY RT-PCR (RSV, FLU A&B, COVID)  [id number]      RADIOLOGY  Left rib x-ray and 1 view chest per radiologist is negative for acute rib fracture and shows chronic bronchitis.   PROCEDURES:  Critical Care performed:   Procedures   MEDICATIONS ORDERED IN ED: Medications  ipratropium-albuterol (DUONEB) 0.5-2.5 (3) MG/3ML nebulizer solution 3 mL (3 mLs Nebulization Given 09/16/22 1723)     IMPRESSION / MDM / ASSESSMENT AND PLAN / ED COURSE  I reviewed the triage vital signs and the nursing notes.   Differential diagnosis includes, but is not limited to, upper respiratory infection, bronchitis, COVID, influenza, RSV, fractured left rib secondary to coughing.   51 year old female presents to the ED with complaint of left lateral rib pain after she coughed this morning while putting on her shirt.  Patient has point tenderness on palpation of the left lateral ribs without deformity.  X-rays were negative for fracture and patient was made aware.  Patient is currently being treated for bronchitis with Augmentin, prednisone and an inhaler.  Tessalon Perles was added to her current medications and she is encouraged to follow-up with her PCP if any continued problems.     Patient's presentation is most consistent with acute complicated illness / injury requiring diagnostic workup.  FINAL CLINICAL IMPRESSION(S) / ED DIAGNOSES   Final diagnoses:  Viral URI with cough  Chronic bronchitis, unspecified chronic bronchitis type ([place])     Rx / DC Orders   ED Discharge Orders          Ordered    benzonatate (TESSALON) 200 MG capsule  Every 8 hours PRN        09/16/22 1738             Note:  This document was prepared using Dragon voice recognition software and may include unintentional dictation errors.   Johnn Hai, PA-C 09/16/22 1826    Nena Polio, MD 09/16/22 [phone]

## 2022-09-16 NOTE — ED Provider Triage Note (Signed)
Emergency Medicine Provider Triage Evaluation Note  Lisa Howell, a 51 y.o. female  was evaluated in triage.  Pt complains of distant cough with left rib pain.  Patient is currently on a course of steroids antibiotics, and inhalers forearm her PCP.  Review of Systems  Positive: Rib pain, cough Negative: FCS  Physical Exam  BP (!) 164/89 (BP Location: Left Arm)   Pulse 94   Temp 98.5 F (36.9 C) (Oral)   Resp 18   Ht '5\' 1"'$  (1.549 m)   Wt 102.9 kg   SpO2 96%   BMI 42.87 kg/m  Gen:   Awake, no distress  NAD Resp:  Normal effort CA MSK:   Moves extremities without difficulty  Other:    Medical Decision Making  Medically screening exam initiated at 4:14 PM.  Appropriate orders placed.  Lisa Howell was informed that the remainder of the evaluation will be completed by another provider, this initial triage assessment does not replace that evaluation, and the importance of remaining in the ED until their evaluation is complete.  Patient to the ED for evaluation of left posterior rib pain with persistent cough.   Melvenia Needles, PA-C 09/16/22 1615

## 2022-09-16 NOTE — ED Notes (Signed)
Pt taking Augmentin, prednisone

## 2022-09-16 NOTE — Discharge Instructions (Signed)
Follow-up with primary care provider if any continued problems or concerns.  Continue taking your Augmentin and prednisone.  Use the albuterol 1 or 2 puffs every 6 hours as needed for wheezing, cough or shortness of breath.  A prescription for Tessalon Perles was sent to the pharmacy to take every 8 hours as needed for cough.

## 2022-09-16 NOTE — ED Notes (Addendum)
First Nurse Note: Pt to ED via [facility]. Pt has had cough for 3 weeks. A few days ago she coughed and felt something pop in her left flank. Pt reports that she has pain when moving and when taking a deep breath  150/90 BP 94% on RA 105 HR

## 2022-09-17 ENCOUNTER — Other Ambulatory Visit: Payer: Self-pay

## 2022-10-13 ENCOUNTER — Ambulatory Visit: Payer: Self-pay | Admitting: Gerontology

## 2022-10-13 ENCOUNTER — Encounter: Payer: Self-pay | Admitting: Gerontology

## 2022-10-13 ENCOUNTER — Other Ambulatory Visit: Payer: Self-pay

## 2022-10-13 VITALS — BP 123/78 | HR 87 | Temp 97.5°F | Resp 16 | Ht 64.0 in | Wt 221.0 lb

## 2022-10-13 DIAGNOSIS — R0981 Nasal congestion: Secondary | ICD-10-CM | POA: Insufficient documentation

## 2022-10-13 DIAGNOSIS — E119 Type 2 diabetes mellitus without complications: Secondary | ICD-10-CM

## 2022-10-13 DIAGNOSIS — E785 Hyperlipidemia, unspecified: Secondary | ICD-10-CM

## 2022-10-13 DIAGNOSIS — R051 Acute cough: Secondary | ICD-10-CM

## 2022-10-13 MED ORDER — FLUTICASONE PROPIONATE 50 MCG/ACT NA SUSP
2.0000 | Freq: Every day | NASAL | 0 refills | Status: DC
  Filled 2022-10-13: qty 16, 30d supply, fill #0

## 2022-10-13 MED ORDER — PRAVASTATIN SODIUM 20 MG PO TABS
20.0000 mg | ORAL_TABLET | Freq: Every day | ORAL | 2 refills | Status: DC
  Filled 2022-10-13: qty 30, 30d supply, fill #0
  Filled 2022-11-19: qty 30, 30d supply, fill #1

## 2022-10-13 MED ORDER — METFORMIN HCL ER 500 MG PO TB24
1000.0000 mg | ORAL_TABLET | Freq: Every day | ORAL | 2 refills | Status: DC
  Filled 2022-10-13: qty 60, 30d supply, fill #0
  Filled 2022-11-19: qty 60, 30d supply, fill #1

## 2022-10-13 MED ORDER — SALINE SPRAY 0.65 % NA SOLN
1.0000 | NASAL | 0 refills | Status: DC | PRN
  Filled 2022-10-13: qty 30, fill #0

## 2022-10-13 MED ORDER — BENZONATATE 100 MG PO CAPS
100.0000 mg | ORAL_CAPSULE | Freq: Three times a day (TID) | ORAL | 0 refills | Status: DC | PRN
  Filled 2022-10-13: qty 20, 7d supply, fill #0

## 2022-10-13 MED ORDER — ALBUTEROL SULFATE HFA 108 (90 BASE) MCG/ACT IN AERS
1.0000 | INHALATION_SPRAY | Freq: Four times a day (QID) | RESPIRATORY_TRACT | 0 refills | Status: DC | PRN
  Filled 2022-10-13: qty 6.7, 25d supply, fill #0

## 2022-10-13 NOTE — Patient Instructions (Signed)

## 2022-10-13 NOTE — Progress Notes (Signed)
Established Patient Office Visit  Subjective   Patient ID: Lisa Howell, female    DOB: [birth year]  Age: 52 y.o. MRN: [id number]  Chief Complaint  Patient presents with   Follow-up   URI    Patient still c/o URI symptoms. Patient tested negative for covid, flu and RSV. Patient has finished course of antibiotics and steroids.    HPI  Lisa Howell  is a 52 y/o female who has history of Type 2 Diabetes, Hyperlipidemia,presents for follow up on URI and medication refill. She was treated with Augmentin, prednisone and inhaler, at her visit on 09/15/22, she went to the ED on 09/16/22, was given Benzon[place]e 200 mg tid prn and chest X ray showed Mild chronic bronchitic changes with minimal bibasilar atelectasis. No acute LEFT rib abnormalities. Her Respiratory panel test was negative for Flu and Covid 19. Currently, she continues to have intermittent non productive coughing spells , nasal congestion and left sided thoracic pain with coughing. She states that taking Ibuprofen relieve symptoms. She denies chest pain, palpitation, wheezing, fever and chills. Overall, she states that she is feeling 75% better.    Past Medical History:  Diagnosis Date   Diabetes mellitus without complication ([place])    Hyperlipidemia    Past Surgical History:  Procedure Laterality Date   ABDOMINAL HYSTERECTOMY     CESAREAN SECTION     CHOLECYSTECTOMY     COLONOSCOPY WITH PROPOFOL N/A 12/30/2020   Procedure: COLONOSCOPY WITH PROPOFOL;  Surgeon: Lucilla Lame, MD;  Location: [facility] ENDOSCOPY;  Service: Endoscopy;  Laterality: N/A;   HERNIA REPAIR  1998   c-section site   Social History   Tobacco Use   Smoking status: Former    Years: 2.00    Types: Cigarettes    Quit date: 12/31/1999    Years since quitting: 22.8   Smokeless tobacco: Never  Vaping Use   Vaping Use: Never used  Substance Use Topics   Alcohol use: Yes    Comment: social   Drug use: No   Family History  Problem Relation Age of  Onset   Diabetes Mother    Heart disease Mother    Cancer Mother        "bone"   Diabetes Father    Cancer Father        colon   Diabetes Sister    Diabetes Brother    Heart disease Maternal Grandmother    Heart disease Maternal Grandfather    Dementia Paternal Grandmother    Alzheimer's disease Paternal Grandfather    Diabetes Half-Sister    Allergies  Allergen Reactions   Onion Anaphylaxis   Aspirin Other (See Comments)    Drowsy  Drowsy  Drowsy       Review of Systems  Constitutional: Negative.   HENT:  Positive for congestion and sinus pain. Negative for sore throat.   Respiratory:  Positive for cough.   Cardiovascular: Negative.   Skin: Negative.   Neurological: Negative.       Objective:     BP 123/78 (BP Location: Right Arm, Patient Position: Sitting, Cuff Size: Large)   Pulse 87   Temp (!) 97.5 F (36.4 C) (Oral)   Resp 16   Ht _0  (1.626 m)   Wt 221 lb (100.2 kg)   SpO2 95%   BMI 37.93 kg/m  BP Readings from Last 3 Encounters:  10/13/22 123/78  09/16/22 (!) 164/89  09/15/22 (!) 135/90   Wt Readings from Last 3 Encounters:  10/13/22 221 lb (100.2 kg)  09/16/22 226 lb 14.4 oz (102.9 kg)  09/15/22 226 lb 11.2 oz (102.8 kg)      Physical Exam HENT:     Head: Normocephalic and atraumatic.     Nose: No nasal deformity, septal deviation, nasal tenderness, mucosal edema, congestion or rhinorrhea.     Right Sinus: Maxillary sinus tenderness present. No frontal sinus tenderness.     Left Sinus: Maxillary sinus tenderness present. No frontal sinus tenderness.     Mouth/Throat:     Mouth: Mucous membranes are moist.     Pharynx: Oropharynx is clear. Uvula midline.     Tonsils: No tonsillar exudate or tonsillar abscesses.  Cardiovascular:     Rate and Rhythm: Normal rate and regular rhythm.     Pulses: Normal pulses.     Heart sounds: Normal heart sounds.  Pulmonary:     Effort: Pulmonary effort is normal.     Breath sounds: Normal breath  sounds.  Neurological:     General: No focal deficit present.     Mental Status: She is alert and oriented to person, place, and time. Mental status is at baseline.  Psychiatric:        Mood and Affect: Mood normal.        Behavior: Behavior normal.        Thought Content: Thought content normal.        Judgment: Judgment normal.      No results found for any visits on 10/13/22.  Last CBC Lab Results  Component Value Date   WBC 8.1 12/09/2021   HGB 13.0 12/09/2021   HCT 39.9 12/09/2021   MCV 78 (L) 12/09/2021   MCH 25.3 (L) 12/09/2021   RDW 14.6 12/09/2021   PLT 373 63/84/5364   Last metabolic panel Lab Results  Component Value Date   GLUCOSE 136 (H) 12/09/2021   NA 142 12/09/2021   K 4.6 12/09/2021   CL 102 12/09/2021   CO2 24 12/09/2021   BUN 9 12/09/2021   CREATININE 0.60 12/09/2021   EGFR 107 12/09/2021   CALCIUM 9.5 12/09/2021   PROT 7.2 12/09/2021   ALBUMIN 4.3 12/09/2021   LABGLOB 2.9 12/09/2021   AGRATIO 1.5 12/09/2021   BILITOT 0.4 12/09/2021   ALKPHOS 124 (H) 12/09/2021   AST 19 12/09/2021   ALT 16 12/09/2021   ANIONGAP 12 05/31/2020   Last lipids Lab Results  Component Value Date   CHOL 190 03/10/2022   HDL 48 03/10/2022   LDLCALC 116 (H) 03/10/2022   TRIG 145 03/10/2022   CHOLHDL 4.0 03/10/2022   Last hemoglobin A1c Lab Results  Component Value Date   HGBA1C 6.9 (A) 09/15/2022      The 10-year ASCVD risk score (Arnett DK, et al., 2019) is: 3.2%    Assessment & Plan:   1. Type 2 diabetes mellitus without complication, without long-term current use of insulin ([place]) - She will continue current medication, will recheck HgbA1c. She was advised to continue on low carb/non concentrated sweet diet and exercise as tolerated. - metFORMIN (GLUCOPHAGE-XR) 500 MG 24 hr tablet; Take 2 tablets (1,000 mg total) by mouth once daily with breakfast.  Dispense: 60 tablet; Refill: 2  2. Elevated lipids - She will continue current medication, low  fat/cholesterol diet and exercise as tolerated. - pravastatin (PRAVACHOL) 20 MG tablet; Take 1 tablet (20 mg total) by mouth once daily.  Dispense: 30 tablet; Refill: 2  3. Nasal congestion - She will continue on current  medication, advised to go to the ED for worsening symptoms. - albuterol (VENTOLIN HFA) 108 (90 Base) MCG/ACT inhaler; Inhale 1-2 puffs into the lungs every 6 (six) hours as needed for wheezing or shortness of breath.  Dispense: 6.7 g; Refill: 0 - fluticasone (FLONASE) 50 MCG/ACT nasal spray; Place 2 sprays into both nostrils daily.  Dispense: 16 g; Refill: 0 - sodium chloride (OCEAN) 0.65 % SOLN nasal spray; Place 1 spray into both nostrils as needed for congestion.  Dispense: 30 mL; Refill: 0  4. Acute cough - She will continue on current medication as needed. - benzonatate (TESSALON PERLES) 100 MG capsule; Take 1 capsule (100 mg total) by mouth 3 (three) times daily as needed.  Dispense: 20 capsule; Refill: 0   Return in about 9 weeks (around 12/15/2022), or if symptoms worsen or fail to improve.    Lisa Bewley Jerold Coombe, NP

## 2022-11-11 ENCOUNTER — Other Ambulatory Visit: Payer: Self-pay

## 2022-11-11 ENCOUNTER — Encounter: Payer: Self-pay | Admitting: Gerontology

## 2022-11-11 ENCOUNTER — Ambulatory Visit: Payer: Self-pay | Admitting: Gerontology

## 2022-11-11 VITALS — BP 122/83 | HR 104 | Temp 98.7°F | Resp 16 | Ht 62.0 in | Wt 218.1 lb

## 2022-11-11 DIAGNOSIS — R051 Acute cough: Secondary | ICD-10-CM

## 2022-11-11 DIAGNOSIS — J42 Unspecified chronic bronchitis: Secondary | ICD-10-CM

## 2022-11-11 DIAGNOSIS — R0981 Nasal congestion: Secondary | ICD-10-CM

## 2022-11-11 MED ORDER — ALBUTEROL SULFATE HFA 108 (90 BASE) MCG/ACT IN AERS
1.0000 | INHALATION_SPRAY | Freq: Four times a day (QID) | RESPIRATORY_TRACT | 0 refills | Status: DC | PRN
  Filled 2022-11-11: qty 6.7, 25d supply, fill #0

## 2022-11-11 MED ORDER — BENZONATATE 100 MG PO CAPS
100.0000 mg | ORAL_CAPSULE | Freq: Three times a day (TID) | ORAL | 0 refills | Status: DC | PRN
  Filled 2022-11-11: qty 20, 7d supply, fill #0

## 2022-11-11 MED ORDER — LEVOFLOXACIN 750 MG PO TABS
750.0000 mg | ORAL_TABLET | Freq: Every day | ORAL | 0 refills | Status: DC
  Filled 2022-11-11: qty 5, 5d supply, fill #0

## 2022-11-11 NOTE — Patient Instructions (Signed)

## 2022-11-11 NOTE — Progress Notes (Signed)
Established Patient Office Visit  Subjective   Patient ID: Lisa Howell, female    DOB: [birth year]  Age: 52 y.o. MRN: [id number]  Chief Complaint  Patient presents with   Follow-up    Patient c/o continued cough since 08/2022. Patient states she has had fever (100-100.7) and body aches x 2 days. Patient states last dose of Tylenol was last night ~9:30pm. Patient states home covid test was negative yesterday.    HPI  Lisa Howell is a 52 y/o female who has history of Type 2 Diabetes, Hyperlipidemia who presents for follow up on URI. She was previously diagnosed with bronchitis that was treated with Augmentin and prednisone. She has been out of work intermittently recently. She continues with an intermittent, croupy cough. It is productive but she has not been able to cough up the secretions. She reports having fever with temperature of 100.69F yesterday, which was associated with chills. She states that lying flat makes the cough worse, and it keeps her up at night. She has had some pain on the left side of her chest when she coughs that she states is improving. She props herself up to sleep at night with a couple of pillows on her left side, which helps this. She does not currently smoke. She quit smoking in 1996 when her son was born. She endorses body aches and chills. She feels warm and diaphoretic today but does not have a fever, and her blood glucose level was 97 mg/dl during visit. Her primary concern is her persistent cough.   Review of Systems  Constitutional:  Positive for chills.  HENT: Negative.    Eyes: Negative.   Respiratory:  Positive for cough.   Cardiovascular: Negative.  Negative for palpitations.  Gastrointestinal: Negative.   Genitourinary: Negative.   Musculoskeletal: Negative.   Skin: Negative.   Neurological: Negative.   Endo/Heme/Allergies: Negative.   Psychiatric/Behavioral:  The patient has insomnia.      Objective:     BP 122/83 (BP Location:  Right Arm, Patient Position: Sitting, Cuff Size: Large)   Pulse (!) 104   Temp 98.7 F (37.1 C) (Oral)   Resp 16   Ht '5\' 2"'$  (1.575 m)   Wt 218 lb 1.6 oz (98.9 kg)   SpO2 96%   BMI 39.89 kg/m  BP Readings from Last 3 Encounters:  11/11/22 122/83  10/13/22 123/78  09/16/22 (!) 164/89   Wt Readings from Last 3 Encounters:  11/11/22 218 lb 1.6 oz (98.9 kg)  10/13/22 221 lb (100.2 kg)  09/16/22 226 lb 14.4 oz (102.9 kg)      Physical Exam Constitutional:      Appearance: Normal appearance.  HENT:     Head: Normocephalic.     Right Ear: Tympanic membrane and external ear normal.     Left Ear: Tympanic membrane and external ear normal.     Nose: Congestion present.     Mouth/Throat:     Mouth: Mucous membranes are moist.     Pharynx: Oropharynx is clear. Uvula midline.     Tonsils: 0 on the right. 0 on the left.  Eyes:     Pupils: Pupils are equal, round, and reactive to light.  Cardiovascular:     Rate and Rhythm: Normal rate and regular rhythm.     Pulses: Normal pulses.     Heart sounds: Normal heart sounds.  Pulmonary:     Effort: Pulmonary effort is normal.     Breath sounds: Examination of the right-lower  field reveals decreased breath sounds. Examination of the left-lower field reveals decreased breath sounds. Decreased breath sounds present.  Abdominal:     General: Bowel sounds are normal.  Musculoskeletal:        General: Normal range of motion.     Cervical back: Normal range of motion.  Skin:    General: Skin is warm and dry.     Capillary Refill: Capillary refill takes less than 2 seconds.  Neurological:     General: No focal deficit present.     Mental Status: She is alert and oriented to person, place, and time. Mental status is at baseline.  Psychiatric:        Mood and Affect: Mood normal.        Behavior: Behavior normal.        Thought Content: Thought content normal.        Judgment: Judgment normal.     Last CBC Lab Results  Component  Value Date   WBC 8.1 12/09/2021   HGB 13.0 12/09/2021   HCT 39.9 12/09/2021   MCV 78 (L) 12/09/2021   MCH 25.3 (L) 12/09/2021   RDW 14.6 12/09/2021   PLT 373 01/75/1025   Last metabolic panel Lab Results  Component Value Date   GLUCOSE 136 (H) 12/09/2021   NA 142 12/09/2021   K 4.6 12/09/2021   CL 102 12/09/2021   CO2 24 12/09/2021   BUN 9 12/09/2021   CREATININE 0.60 12/09/2021   EGFR 107 12/09/2021   CALCIUM 9.5 12/09/2021   PROT 7.2 12/09/2021   ALBUMIN 4.3 12/09/2021   LABGLOB 2.9 12/09/2021   AGRATIO 1.5 12/09/2021   BILITOT 0.4 12/09/2021   ALKPHOS 124 (H) 12/09/2021   AST 19 12/09/2021   ALT 16 12/09/2021   ANIONGAP 12 05/31/2020   Last lipids Lab Results  Component Value Date   CHOL 190 03/10/2022   HDL 48 03/10/2022   LDLCALC 116 (H) 03/10/2022   TRIG 145 03/10/2022   CHOLHDL 4.0 03/10/2022   Last hemoglobin A1c Lab Results  Component Value Date   HGBA1C 6.9 (A) 09/15/2022   Last thyroid functions Lab Results  Component Value Date   TSH 0.931 12/09/2021   Last vitamin D No results found for: "25OHVITD2", "25OHVITD3", "VD25OH" Last vitamin B12 and Folate No results found for: "VITAMINB12", "FOLATE"    The 10-year ASCVD risk score (Arnett DK, et al., 2019) is: 3.4%    Assessment & Plan:  1. Acute cough - Patient was given tessalon perles to help with the cough. - She was advised to go to the ED with worsening symptoms. - benzonatate (TESSALON PERLES) 100 MG capsule; Take 1 capsule (100 mg total) by mouth 3 (three) times daily as needed.  Dispense: 20 capsule; Refill: 0  2. Nasal congestion - Patient was instructed to continue using albuterol as needed. - albuterol (VENTOLIN HFA) 108 (90 Base) MCG/ACT inhaler; Inhale 1-2 puffs into the lungs every 6 (six) hours as needed for wheezing or shortness of breath.  Dispense: 6.7 g; Refill: 0  3. Chronic bronchitis, unspecified chronic bronchitis type ([place]) - She was treated prophylactically due to  her history of  bronchitis and comorbidity. Patient was started on Levaquin. - She was educated on side effects and instructed to notify the clinic if any adverse reactions. - levofloxacin (LEVAQUIN) 750 MG tablet; Take 1 tablet (750 mg total) by mouth daily.  Dispense: 5 tablet; Refill: 0    Patient will follow up in clinic in about  5 weeks, 12/14/22, or if symptoms worsen or fail to improve.  Eloise Harman, FNP Student

## 2022-11-12 ENCOUNTER — Other Ambulatory Visit (HOSPITAL_COMMUNITY): Payer: Self-pay

## 2022-11-19 ENCOUNTER — Other Ambulatory Visit: Payer: Self-pay

## 2022-11-24 ENCOUNTER — Other Ambulatory Visit: Payer: Self-pay

## 2022-12-15 ENCOUNTER — Encounter: Payer: Self-pay | Admitting: Gerontology

## 2022-12-15 ENCOUNTER — Ambulatory Visit: Payer: Self-pay | Admitting: Gerontology

## 2022-12-15 ENCOUNTER — Other Ambulatory Visit: Payer: Self-pay

## 2022-12-15 VITALS — BP 129/87 | HR 80 | Temp 98.1°F | Resp 16 | Ht 61.0 in | Wt 217.3 lb

## 2022-12-15 DIAGNOSIS — E119 Type 2 diabetes mellitus without complications: Secondary | ICD-10-CM

## 2022-12-15 DIAGNOSIS — E785 Hyperlipidemia, unspecified: Secondary | ICD-10-CM

## 2022-12-15 DIAGNOSIS — Z0000 Encounter for general adult medical examination without abnormal findings: Secondary | ICD-10-CM

## 2022-12-15 LAB — POCT GLYCOSYLATED HEMOGLOBIN (HGB A1C): Hemoglobin A1C: 6.6 % — AB (ref 4.0–5.6)

## 2022-12-15 LAB — GLUCOSE, POCT (MANUAL RESULT ENTRY): POC Glucose: 111 mg/dl — AB (ref 70–99)

## 2022-12-15 MED ORDER — PRAVASTATIN SODIUM 20 MG PO TABS
20.0000 mg | ORAL_TABLET | Freq: Every day | ORAL | 2 refills | Status: DC
  Filled 2022-12-15: qty 30, 30d supply, fill #0
  Filled 2023-01-23 (×2): qty 30, 30d supply, fill #1
  Filled 2023-02-21: qty 30, 30d supply, fill #2

## 2022-12-15 MED ORDER — METFORMIN HCL ER 500 MG PO TB24
1000.0000 mg | ORAL_TABLET | Freq: Every day | ORAL | 2 refills | Status: DC
  Filled 2022-12-15: qty 60, 30d supply, fill #0

## 2022-12-15 NOTE — Progress Notes (Signed)
Established Patient Office Visit  Subjective   Patient ID: Lisa Howell, female    DOB: [birth year]  Age: 52 y.o. MRN: [id number]  Chief Complaint  Patient presents with   Follow-up   Diabetes    HPI  Lisa Howell is a 52 y/o female who has history of Type 2 Diabetes, Hyperlipidemia who presents for follow up visit. She states that she's compliant with her medications, denies side effects and continues to make healthy lifestyle changes. She reports that she misplaced her orange card for dental referral, but currently experiencing pain to left upper incissior that has been going on for 2 weeks. Tooth is brown to dark colored and its' chipped. She denies gingival abscess, and pain. Her HgbA1c  checked during visit decreased from 6.9% to  6.6%, and her blood glucose was 111 mg/dl. She checks her fasting blood glucose daily and it's usually less than 120 mg/dl. She denies hypo/hyperglycemic symptoms, peripheral neuropathy and performs daily foot checks. She states that her respiratory symptoms has resolved. Overall, she states that she's doing well and offers no further complaint.    Review of Systems  Constitutional: Negative.   Eyes: Negative.   Respiratory: Negative.    Cardiovascular: Negative.   Genitourinary: Negative.   Neurological: Negative.   Endo/Heme/Allergies: Negative.   Psychiatric/Behavioral: Negative.        Objective:     BP 129/87 (BP Location: Left Arm, Patient Position: Sitting, Cuff Size: Large)   Pulse 80   Temp 98.1 F (36.7 C) (Oral)   Resp 16   Ht '5\' 1"'$  (1.549 m)   Wt 217 lb 4.8 oz (98.6 kg)   SpO2 95%   BMI 41.06 kg/m  BP Readings from Last 3 Encounters:  12/15/22 129/87  11/11/22 122/83  10/13/22 123/78   Wt Readings from Last 3 Encounters:  12/15/22 217 lb 4.8 oz (98.6 kg)  11/11/22 218 lb 1.6 oz (98.9 kg)  10/13/22 221 lb (100.2 kg)      Physical Exam HENT:     Head: Normocephalic and atraumatic.     Mouth/Throat:      Mouth: Mucous membranes are moist.  Cardiovascular:     Rate and Rhythm: Normal rate and regular rhythm.     Pulses: Normal pulses.     Heart sounds: Normal heart sounds.  Pulmonary:     Effort: Pulmonary effort is normal.     Breath sounds: Normal breath sounds.  Skin:    General: Skin is warm.  Neurological:     General: No focal deficit present.     Mental Status: She is alert and oriented to person, place, and time. Mental status is at baseline.  Psychiatric:        Mood and Affect: Mood normal.        Behavior: Behavior normal.        Thought Content: Thought content normal.        Judgment: Judgment normal.      Results for orders placed or performed in visit on 12/15/22  POCT Glucose (CBG)  Result Value Ref Range   POC Glucose 111 (A) 70 - 99 mg/dl  POCT HgB A1C  Result Value Ref Range   Hemoglobin A1C 6.6 (A) 4.0 - 5.6 %   HbA1c POC (<> result, manual entry)     HbA1c, POC (prediabetic range)     HbA1c, POC (controlled diabetic range)      Last CBC Lab Results  Component Value Date  WBC 8.1 12/09/2021   HGB 13.0 12/09/2021   HCT 39.9 12/09/2021   MCV 78 (L) 12/09/2021   MCH 25.3 (L) 12/09/2021   RDW 14.6 12/09/2021   PLT 373 [id number]   Last metabolic panel Lab Results  Component Value Date   GLUCOSE 136 (H) 12/09/2021   NA 142 12/09/2021   K 4.6 12/09/2021   CL 102 12/09/2021   CO2 24 12/09/2021   BUN 9 12/09/2021   CREATININE 0.60 12/09/2021   EGFR 107 12/09/2021   CALCIUM 9.5 12/09/2021   PROT 7.2 12/09/2021   ALBUMIN 4.3 12/09/2021   LABGLOB 2.9 12/09/2021   AGRATIO 1.5 12/09/2021   BILITOT 0.4 12/09/2021   ALKPHOS 124 (H) 12/09/2021   AST 19 12/09/2021   ALT 16 12/09/2021   ANIONGAP 12 05/31/2020   Last lipids Lab Results  Component Value Date   CHOL 190 03/10/2022   HDL 48 03/10/2022   LDLCALC 116 (H) 03/10/2022   TRIG 145 03/10/2022   CHOLHDL 4.0 03/10/2022   Last hemoglobin A1c Lab Results  Component Value Date   HGBA1C  6.6 (A) 12/15/2022   Last thyroid functions Lab Results  Component Value Date   TSH 0.931 12/09/2021   Last vitamin D No results found for: "25OHVITD2", "25OHVITD3", "VD25OH"    The 10-year ASCVD risk score (Arnett DK, et al., 2019) is: 3.8%    Assessment & Plan:    1. Type 2 diabetes mellitus without complication, without long-term current use of insulin ([facility]) - Her diabetes is improving, she will continue current medication, low carb/non concentrated sweet diet and exercise as tolerated. - POCT Glucose (CBG) - POCT HgB A1C - Urine Microalbumin w/creat. ratio; Future - metFORMIN (GLUCOPHAGE-XR) 500 MG 24 hr tablet; Take 2 tablets (1,000 mg total) by mouth once daily with breakfast.  Dispense: 60 tablet; Refill: 2 - Urine Microalbumin w/creat. ratio  2. Health care maintenance - Routine labs will be checked. - CBC w/Diff; Future - Comp Met (CMET); Future - Lipid panel; Future - Lipid panel - Comp Met (CMET) - CBC w/Diff  3. Elevated lipids -The 10-year ASCVD risk score (Arnett DK, et al., 2019) is: 3.8%   Values used to calculate the score:     Age: 25 years     Sex: Female     Is Non-Hispanic African American: No     Diabetic: Yes     Tobacco smoker: No     Systolic Blood Pressure: [id number] mmHg     Is BP treated: No     HDL Cholesterol: 48 mg/dL     Total Cholesterol: 190 mg/dL Her ASCVD risk factor is 3.8%, she will continue current medication, low fat/cholesterol diet and exercise as tolerated. - pravastatin (PRAVACHOL) 20 MG tablet; Take 1 tablet (20 mg total) by mouth once daily.  Dispense: 30 tablet; Refill: 2   Return in about 3 months (around 03/17/2023), or if symptoms worsen or fail to improve.    Dishawn Bhargava Jerold Coombe, NP

## 2022-12-15 NOTE — Patient Instructions (Signed)
Heart-Healthy Eating Plan Many factors influence your heart health, including eating and exercise habits. Heart health is also called coronary health. Coronary risk increases with abnormal blood fat (lipid) levels. A heart-healthy eating plan includes limiting unhealthy fats, increasing healthy fats, limiting salt (sodium) intake, and making other diet and lifestyle changes. What is my plan? Your health care provider may recommend that: You limit your fat intake to _________% or less of your total calories each day. You limit your saturated fat intake to _________% or less of your total calories each day. You limit the amount of cholesterol in your diet to less than _________ mg per day. You limit the amount of sodium in your diet to less than _________ mg per day. What are tips for following this plan? Cooking Cook foods using methods other than frying. Baking, boiling, grilling, and broiling are all good options. Other ways to reduce fat include: Removing the skin from poultry. Removing all visible fats from meats. Steaming vegetables in water or broth. Meal planning  At meals, imagine dividing your plate into fourths: Fill one-half of your plate with vegetables and green salads. Fill one-fourth of your plate with whole grains. Fill one-fourth of your plate with lean protein foods. Eat 2-4 cups of vegetables per day. One cup of vegetables equals 1 cup (91 g) broccoli or cauliflower florets, 2 medium carrots, 1 large bell pepper, 1 large sweet potato, 1 large tomato, 1 medium white potato, 2 cups (150 g) raw leafy greens. Eat 1-2 cups of fruit per day. One cup of fruit equals 1 small apple, 1 large banana, 1 cup (237 g) mixed fruit, 1 large orange,  cup (82 g) dried fruit, 1 cup (240 mL) 100% fruit juice. Eat more foods that contain soluble fiber. Examples include apples, broccoli, carrots, beans, peas, and barley. Aim to get 25-30 g of fiber per day. Increase your consumption of legumes,  nuts, and seeds to 4-5 servings per week. One serving of dried beans or legumes equals  cup (90 g) cooked, 1 serving of nuts is  oz (12 almonds, 24 pistachios, or 7 walnut halves), and 1 serving of seeds equals  oz (8 g). Fats Choose healthy fats more often. Choose monounsaturated and polyunsaturated fats, such as olive and canola oils, avocado oil, flaxseeds, walnuts, almonds, and seeds. Eat more omega-3 fats. Choose salmon, mackerel, sardines, tuna, flaxseed oil, and ground flaxseeds. Aim to eat fish at least 2 times each week. Check food labels carefully to identify foods with trans fats or high amounts of saturated fat. Limit saturated fats. These are found in animal products, such as meats, butter, and cream. Plant sources of saturated fats include palm oil, palm kernel oil, and coconut oil. Avoid foods with partially hydrogenated oils in them. These contain trans fats. Examples are stick margarine, some tub margarines, cookies, crackers, and other baked goods. Avoid fried foods. General information Eat more home-cooked food and less restaurant, buffet, and fast food. Limit or avoid alcohol. Limit foods that are high in added sugar and simple starches such as foods made using white refined flour (white breads, pastries, sweets). Lose weight if you are overweight. Losing just 5-10% of your body weight can help your overall health and prevent diseases such as diabetes and heart disease. Monitor your sodium intake, especially if you have high blood pressure. Talk with your health care provider about your sodium intake. Try to incorporate more vegetarian meals weekly. What foods should I eat? Fruits All fresh, canned (in natural  juice), or frozen fruits. Vegetables Fresh or frozen vegetables (raw, steamed, roasted, or grilled). Green salads. Grains Most grains. Choose whole wheat and whole grains most of the time. Rice and pasta, including brown rice and pastas made with whole wheat. Meats  and other proteins Lean, well-trimmed beef, veal, pork, and lamb. Chicken and [place] without skin. All fish and shellfish. Wild duck, rabbit, pheasant, and venison. Egg whites or low-cholesterol egg substitutes. Dried beans, peas, lentils, and tofu. Seeds and most nuts. Dairy Low-fat or nonfat cheeses, including ricotta and mozzarella. Skim or 1% milk (liquid, powdered, or evaporated). Buttermilk made with low-fat milk. Nonfat or low-fat yogurt. Fats and oils Non-hydrogenated (trans-free) margarines. Vegetable oils, including soybean, sesame, sunflower, olive, avocado, peanut, safflower, corn, canola, and cottonseed. Salad dressings or mayonnaise made with a vegetable oil. Beverages Water (mineral or sparkling). Coffee and tea. Unsweetened ice tea. Diet beverages. Sweets and desserts Sherbet, gelatin, and fruit ice. Small amounts of dark chocolate. Limit all sweets and desserts. Seasonings and condiments All seasonings and condiments. The items listed above may not be a complete list of foods and beverages you can eat. Contact a dietitian for more options. What foods should I avoid? Fruits Canned fruit in heavy syrup. Fruit in cream or butter sauce. Fried fruit. Limit coconut. Vegetables Vegetables cooked in cheese, cream, or butter sauce. Fried vegetables. Grains Breads made with saturated or trans fats, oils, or whole milk. Croissants. Sweet rolls. Donuts. High-fat crackers, such as cheese crackers and chips. Meats and other proteins Fatty meats, such as hot dogs, ribs, sausage, bacon, rib-eye roast or steak. High-fat deli meats, such as salami and bologna. Caviar. Domestic duck and goose. Organ meats, such as liver. Dairy Cream, sour cream, cream cheese, and creamed cottage cheese. Whole-milk cheeses. Whole or 2% milk (liquid, evaporated, or condensed). Whole buttermilk. Cream sauce or high-fat cheese sauce. Whole-milk yogurt. Fats and oils Meat fat, or shortening. Cocoa butter,  hydrogenated oils, palm oil, coconut oil, palm kernel oil. Solid fats and shortenings, including bacon fat, salt pork, lard, and butter. Nondairy cream substitutes. Salad dressings with cheese or sour cream. Beverages Regular sodas and any drinks with added sugar. Sweets and desserts Frosting. Pudding. Cookies. Cakes. Pies. Milk chocolate or white chocolate. Buttered syrups. Full-fat ice cream or ice cream drinks. The items listed above may not be a complete list of foods and beverages to avoid. Contact a dietitian for more information. Summary Heart-healthy meal planning includes limiting unhealthy fats, increasing healthy fats, limiting salt (sodium) intake and making other diet and lifestyle changes. Lose weight if you are overweight. Losing just 5-10% of your body weight can help your overall health and prevent diseases such as diabetes and heart disease. Focus on eating a balance of foods, including fruits and vegetables, low-fat or nonfat dairy, lean protein, nuts and legumes, whole grains, and heart-healthy oils and fats. This information is not intended to replace advice given to you by your health care provider. Make sure you discuss any questions you have with your health care provider. Document Revised: 11/02/2021 Document Reviewed: 11/02/2021 Elsevier Patient Education  [place] for Diabetes Mellitus, Adult Carbohydrate counting is a method of keeping track of how many carbohydrates you eat. Eating carbohydrates increases the amount of sugar (glucose) in the blood. Counting how many carbohydrates you eat improves how well you manage your blood glucose. This, in turn, helps you manage your diabetes. Carbohydrates are measured in grams (g) per serving. It is important to know how  many carbohydrates (in grams or by serving size) you can have in each meal. This is different for every person. A dietitian can help you make a meal plan and calculate how many  carbohydrates you should have at each meal and snack. What foods contain carbohydrates? Carbohydrates are found in the following foods: Grains, such as breads and cereals. Dried beans and soy products. Starchy vegetables, such as potatoes, peas, and corn. Fruit and fruit juices. Milk and yogurt. Sweets and snack foods, such as cake, cookies, candy, chips, and soft drinks. How do I count carbohydrates in foods? There are two ways to count carbohydrates in food. You can read food labels or learn standard serving sizes of foods. You can use either of these methods or a combination of both. Using the Nutrition Facts label The Nutrition Facts list is included on the labels of almost all packaged foods and beverages in the [place]. It includes: The serving size. Information about nutrients in each serving, including the grams of carbohydrate per serving. To use the Nutrition Facts, decide how many servings you will have. Then, multiply the number of servings by the number of carbohydrates per serving. The resulting number is the total grams of carbohydrates that you will be having. Learning the standard serving sizes of foods When you eat carbohydrate foods that are not packaged or do not include Nutrition Facts on the label, you need to measure the servings in order to count the grams of carbohydrates. Measure the foods that you will eat with a food scale or measuring cup, if needed. Decide how many standard-size servings you will eat. Multiply the number of servings by 15. For foods that contain carbohydrates, one serving equals 15 g of carbohydrates. For example, if you eat 2 cups or 10 oz (300 g) of strawberries, you will have eaten 2 servings and 30 g of carbohydrates (2 servings x 15 g = 30 g). For foods that have more than one food mixed, such as soups and casseroles, you must count the carbohydrates in each food that is included. The following list contains standard serving sizes of  common carbohydrate-rich foods. Each of these servings has about 15 g of carbohydrates: 1 slice of bread. 1 six-inch (15 cm) tortilla. ? cup or 2 oz (53 g) cooked rice or pasta.  cup or 3 oz (85 g) cooked or canned, drained and rinsed beans or lentils.  cup or 3 oz (85 g) starchy vegetable, such as peas, corn, or squash.  cup or 4 oz (120 g) hot cereal.  cup or 3 oz (85 g) boiled or mashed potatoes, or  or 3 oz (85 g) of a large baked potato.  cup or 4 fl oz (118 mL) fruit juice. 1 cup or 8 fl oz (237 mL) milk. 1 small or 4 oz (106 g) apple.  or 2 oz (63 g) of a medium banana. 1 cup or 5 oz (150 g) strawberries. 3 cups or 1 oz (28.3 g) popped popcorn. What is an example of carbohydrate counting? To calculate the grams of carbohydrates in this sample meal, follow the steps shown below. Sample meal 3 oz (85 g) chicken breast. ? cup or 4 oz (106 g) brown rice.  cup or 3 oz (85 g) corn. 1 cup or 8 fl oz (237 mL) milk. 1 cup or 5 oz (150 g) strawberries with sugar-free whipped topping. Carbohydrate calculation Identify the foods that contain carbohydrates: Rice. Corn. Milk. Strawberries. Calculate how many servings you have  of each food: 2 servings rice. 1 serving corn. 1 serving milk. 1 serving strawberries. Multiply each number of servings by 15 g: 2 servings rice x 15 g = 30 g. 1 serving corn x 15 g = 15 g. 1 serving milk x 15 g = 15 g. 1 serving strawberries x 15 g = 15 g. Add together all of the amounts to find the total grams of carbohydrates eaten: 30 g + 15 g + 15 g + 15 g = 75 g of carbohydrates total. What are tips for following this plan? Shopping Develop a meal plan and then make a shopping list. Buy fresh and frozen vegetables, fresh and frozen fruit, dairy, eggs, beans, lentils, and whole grains. Look at food labels. Choose foods that have more fiber and less sugar. Avoid processed foods and foods with added sugars. Meal planning Aim to have the same  number of grams of carbohydrates at each meal and for each snack time. Plan to have regular, balanced meals and snacks. Where to find more information American Diabetes Association: diabetes.org Centers for Disease Control and Prevention: [employer].[place] Academy of Nutrition and Dietetics: eatright.org Association of Diabetes Care & Education Specialists: diabeteseducator.org Summary Carbohydrate counting is a method of keeping track of how many carbohydrates you eat. Eating carbohydrates increases the amount of sugar (glucose) in your blood. Counting how many carbohydrates you eat improves how well you manage your blood glucose. This helps you manage your diabetes. A dietitian can help you make a meal plan and calculate how many carbohydrates you should have at each meal and snack. This information is not intended to replace advice given to you by your health care provider. Make sure you discuss any questions you have with your health care provider. Document Revised: 04/30/2020 Document Reviewed: 04/30/2020 Elsevier Patient Education  [place].

## 2022-12-17 LAB — COMPREHENSIVE METABOLIC PANEL
ALT: 10 IU/L (ref 0–32)
AST: 13 IU/L (ref 0–40)
Albumin/Globulin Ratio: 1.2 (ref 1.2–2.2)
Albumin: 3.8 g/dL (ref 3.8–4.9)
Alkaline Phosphatase: 112 IU/L (ref 44–121)
BUN/Creatinine Ratio: 14 (ref 9–23)
BUN: 10 mg/dL (ref 6–24)
Bilirubin Total: 0.4 mg/dL (ref 0.0–1.2)
CO2: 24 mmol/L (ref 20–29)
Calcium: 9.2 mg/dL (ref 8.7–10.2)
Chloride: 103 mmol/L (ref 96–106)
Creatinine, Ser: 0.7 mg/dL (ref 0.57–1.00)
Globulin, Total: 3.1 g/dL (ref 1.5–4.5)
Glucose: 115 mg/dL — ABNORMAL HIGH (ref 70–99)
Potassium: 4.5 mmol/L (ref 3.5–5.2)
Sodium: 141 mmol/L (ref 134–144)
Total Protein: 6.9 g/dL (ref 6.0–8.5)
eGFR: 103 mL/min/{1.73_m2} (ref >59)

## 2022-12-17 LAB — CBC WITH DIFFERENTIAL/PLATELET
Basophils Absolute: 0 10*3/uL (ref 0.0–0.2)
Basos: 0 %
EOS (ABSOLUTE): 0.1 10*3/uL (ref 0.0–0.4)
Eos: 1 %
Hematocrit: 34.3 % (ref 34.0–46.6)
Hemoglobin: 11 g/dL — ABNORMAL LOW (ref 11.1–15.9)
Immature Grans (Abs): 0 10*3/uL (ref 0.0–0.1)
Immature Granulocytes: 0 %
Lymphocytes Absolute: 2.9 10*3/uL (ref 0.7–3.1)
Lymphs: 34 %
MCH: 25.3 pg — ABNORMAL LOW (ref 26.6–33.0)
MCHC: 32.1 g/dL (ref 31.5–35.7)
MCV: 79 fL (ref 79–97)
Monocytes Absolute: 0.3 10*3/uL (ref 0.1–0.9)
Monocytes: 4 %
Neutrophils Absolute: 5.1 10*3/uL (ref 1.4–7.0)
Neutrophils: 61 %
Platelets: 370 10*3/uL (ref 150–450)
RBC: 4.34 x10E6/uL (ref 3.77–5.28)
RDW: 14.7 % (ref 11.7–15.4)
WBC: 8.5 10*3/uL (ref 3.4–10.8)

## 2022-12-17 LAB — LIPID PANEL
Chol/HDL Ratio: 4.1 ratio (ref 0.0–4.4)
Cholesterol, Total: 170 mg/dL (ref 100–199)
HDL: 41 mg/dL (ref >39)
LDL Chol Calc (NIH): 103 mg/dL — ABNORMAL HIGH (ref 0–99)
Triglycerides: 147 mg/dL (ref 0–149)
VLDL Cholesterol Cal: 26 mg/dL (ref 5–40)

## 2022-12-17 LAB — MICROALBUMIN / CREATININE URINE RATIO
Creatinine, Urine: 141.9 mg/dL
Microalb/Creat Ratio: 9 mg/g creat (ref 0–29)
Microalbumin, Urine: 12.7 ug/mL

## 2023-01-23 ENCOUNTER — Other Ambulatory Visit: Payer: Self-pay

## 2023-02-03 ENCOUNTER — Other Ambulatory Visit: Payer: Self-pay

## 2023-02-03 ENCOUNTER — Ambulatory Visit: Payer: Self-pay

## 2023-02-03 VITALS — BP 124/74 | HR 89 | Wt 222.9 lb

## 2023-02-03 DIAGNOSIS — L989 Disorder of the skin and subcutaneous tissue, unspecified: Secondary | ICD-10-CM

## 2023-02-03 MED ORDER — BACITRACIN-NEOMYCIN-POLYMYXIN OINTMENT TUBE
1.0000 | TOPICAL_OINTMENT | Freq: Two times a day (BID) | CUTANEOUS | 0 refills | Status: DC
  Filled 2023-02-03: qty 30, 15d supply, fill #0

## 2023-02-03 NOTE — Progress Notes (Signed)
Established Patient Office Visit  Subjective   Patient ID: Lisa Howell, female    DOB: [birth year]  Age: 52 y.o. MRN: [id number]  No chief complaint on file.   HPI  Lisa Howell is a 52 y/o female with history of Type 2 Diabetes and hyperlipidemia who presents for itching, and 1/2 pea sized cyst to inner cantus of right eye . She states she had eye exam Sunday (01/30/2023) and she developed skin itching and redness on inner canthus of right eye Monday (01/31/2023). She denies eye drainage, visual changes, dizziness, headache, fever, contact to chemical or inset bits. She denies shortness and chest pain. She denies starting new medications or new skin lotion. Overall, she states that she is doing good and offers no further complaints.  Review of Systems  Constitutional: Negative.   HENT: Negative.    Respiratory: Negative.    Cardiovascular: Negative.   Gastrointestinal: Negative.   Genitourinary: Negative.   Skin:  Positive for itching and rash.       Skin itching and rash located on inner canthus of right eye.  Neurological: Negative.   Endo/Heme/Allergies: Negative.   Psychiatric/Behavioral: Negative.        Objective:     There were no vitals taken for this visit. BP Readings from Last 3 Encounters:  02/03/23 124/74  12/15/22 129/87  11/11/22 122/83   Wt Readings from Last 3 Encounters:  02/03/23 222 lb 14.4 oz (101.1 kg)  12/15/22 217 lb 4.8 oz (98.6 kg)  11/11/22 218 lb 1.6 oz (98.9 kg)      Physical Exam Constitutional:      Appearance: Normal appearance.  HENT:     Head: Normocephalic.     Right Ear: Tympanic membrane normal.     Left Ear: Tympanic membrane normal.     Nose: Nose normal.     Mouth/Throat:     Mouth: Mucous membranes are moist.  Eyes:     Extraocular Movements: Extraocular movements intact.     Conjunctiva/sclera: Conjunctivae normal.     Pupils: Pupils are equal, round, and reactive to light.  Cardiovascular:     Rate and  Rhythm: Normal rate and regular rhythm.  Pulmonary:     Effort: Pulmonary effort is normal.     Breath sounds: Normal breath sounds.  Abdominal:     General: Abdomen is flat.     Palpations: Abdomen is soft.  Musculoskeletal:        General: Normal range of motion.     Cervical back: Normal range of motion.  Skin:    Findings: Erythema present.     Comments:  Erythema on Inner canthus of R eye. Complaints pain with palpitation.   Neurological:     General: No focal deficit present.     Mental Status: She is alert and oriented to person, place, and time.  Psychiatric:        Mood and Affect: Mood normal.        Behavior: Behavior normal.    No results found for any visits on 02/03/23.  Last CBC Lab Results  Component Value Date   WBC 8.5 12/15/2022   HGB 11.0 (L) 12/15/2022   HCT 34.3 12/15/2022   MCV 79 12/15/2022   MCH 25.3 (L) 12/15/2022   RDW 14.7 12/15/2022   PLT 370 12/15/2022   Last metabolic panel Lab Results  Component Value Date   GLUCOSE 115 (H) 12/15/2022   NA 141 12/15/2022   K 4.5 12/15/2022  CL 103 12/15/2022   CO2 24 12/15/2022   BUN 10 12/15/2022   CREATININE 0.70 12/15/2022   EGFR 103 12/15/2022   CALCIUM 9.2 12/15/2022   PROT 6.9 12/15/2022   ALBUMIN 3.8 12/15/2022   LABGLOB 3.1 12/15/2022   AGRATIO 1.2 12/15/2022   BILITOT 0.4 12/15/2022   ALKPHOS 112 12/15/2022   AST 13 12/15/2022   ALT 10 12/15/2022   ANIONGAP 12 05/31/2020   Last lipids Lab Results  Component Value Date   CHOL 170 12/15/2022   HDL 41 12/15/2022   LDLCALC 103 (H) 12/15/2022   TRIG 147 12/15/2022   CHOLHDL 4.1 12/15/2022   Last hemoglobin A1c Lab Results  Component Value Date   HGBA1C 6.6 (A) 12/15/2022   Last thyroid functions Lab Results  Component Value Date   TSH 0.931 12/09/2021   Last vitamin D No results found for: "25OHVITD2", "25OHVITD3", "VD25OH" Last vitamin B12 and Folate No results found for: "VITAMINB12", "FOLATE"    The 10-year ASCVD  risk score (Arnett DK, et al., 2019) is: 3.9%    Assessment & Plan:  1. Skin lesion Unknown pathology/etiology about skin itching and redness located on inner canthus of right eye. She was educated to apply neosporin ointment two times a day and warm compress three times a day to affected area. She was educated to perform good hand hygiene. She was educated to call office or go to ED if symptom get worse. - neomycin-bacitracin-polymyxin (NEOSPORIN) OINT; Apply 1 Application topically in the morning and at bedtime.  Dispense: 30 g; Refill: 0   Schedule had been made on 03/17/23 for routine follow up.   Odette Fraction, NP

## 2023-02-03 NOTE — Patient Instructions (Signed)
Warm compress to R eye three times a day and PRN. Call office or go to ED if symptoms get worse.

## 2023-02-04 ENCOUNTER — Other Ambulatory Visit: Payer: Self-pay

## 2023-02-22 ENCOUNTER — Other Ambulatory Visit: Payer: Self-pay

## 2023-03-04 ENCOUNTER — Other Ambulatory Visit: Payer: Self-pay

## 2023-03-10 ENCOUNTER — Other Ambulatory Visit: Payer: Self-pay

## 2023-03-14 ENCOUNTER — Other Ambulatory Visit: Payer: Self-pay

## 2023-03-17 ENCOUNTER — Other Ambulatory Visit: Payer: Self-pay

## 2023-03-17 ENCOUNTER — Ambulatory Visit: Payer: Self-pay | Admitting: Gerontology

## 2023-03-17 ENCOUNTER — Encounter: Payer: Self-pay | Admitting: Gerontology

## 2023-03-17 VITALS — BP 119/77 | HR 81 | Temp 98.2°F | Resp 16 | Ht 61.0 in | Wt 225.1 lb

## 2023-03-17 DIAGNOSIS — R002 Palpitations: Secondary | ICD-10-CM

## 2023-03-17 DIAGNOSIS — E785 Hyperlipidemia, unspecified: Secondary | ICD-10-CM

## 2023-03-17 DIAGNOSIS — M79645 Pain in left finger(s): Secondary | ICD-10-CM | POA: Insufficient documentation

## 2023-03-17 DIAGNOSIS — E119 Type 2 diabetes mellitus without complications: Secondary | ICD-10-CM

## 2023-03-17 LAB — POCT GLYCOSYLATED HEMOGLOBIN (HGB A1C): Hemoglobin A1C: 6.9 % — AB (ref 4.0–5.6)

## 2023-03-17 LAB — GLUCOSE, POCT (MANUAL RESULT ENTRY): POC Glucose: 179 mg/dl — AB (ref 70–99)

## 2023-03-17 MED ORDER — PRAVASTATIN SODIUM 20 MG PO TABS
20.0000 mg | ORAL_TABLET | Freq: Every day | ORAL | 0 refills | Status: DC
  Filled 2023-03-17: qty 90, 90d supply, fill #0

## 2023-03-17 MED ORDER — METFORMIN HCL ER 500 MG PO TB24
1000.0000 mg | ORAL_TABLET | Freq: Every day | ORAL | 0 refills | Status: DC
  Filled 2023-03-17: qty 180, 90d supply, fill #0

## 2023-03-17 NOTE — Progress Notes (Signed)
Established Patient Office Visit  Subjective   Patient ID: Lisa Howell, female    DOB: 09-21-1971  Age: 52 y.o. MRN: [id number]  Chief Complaint  Patient presents with   Follow-up   Diabetes    HPI  Lisa Howell is a 52 y/o female with history of Type 2 Diabetes and hyperlipidemia who presents for follow up and lab review. Her HgbA1c checked during visit increased from 6.6% to 6.9% and her blood glucose was 179 mg/dl. She states that she's compliant with her medications, denies side effects and continues to make healthy lifestyle changes. she states that she checks her blood glucose bid, and her fasting readings are less than 130 mg/dl. She denies hypo/hyperglycemic symptoms, peripheral neuropathy and performs daily foot checks.  She c/o pain to left thumb that's chronic, but has worsened in the past 3 weeks. 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 She describes pain as dull non radiaiting 8/10. She wears hand brace and takes Ibuprofen with moderate relief. She denies motor or muscle weakness.  She also c/o intermittent fluttering sensation to her heart that has been going on for 1 month. She denies chest pain, and shortness of breath. She reports having extensive family history of cardiac problems. She states that her mood is good, denies suicidal nor homicidal ideation. Overall, she states that she's doing well and offers no further complaint.  Review of Systems  Constitutional: Negative.   Eyes: Negative.   Respiratory: Negative.    Cardiovascular: Negative.   Musculoskeletal:        Left thumb pain  Neurological: Negative.   Endo/Heme/Allergies: Negative.   Psychiatric/Behavioral: Negative.        Objective:     BP 119/77 (BP Location: Right Arm, Patient Position: Sitting, Cuff Size: Large)   Pulse 81   Temp 98.2 F (36.8 C) (Oral)   Resp 16   Ht 5\' 1"  (1.549 m)   Wt 225 lb 1.6 oz (102.1 kg)   SpO2 95%   BMI 42.53 kg/m  BP Readings from  Last 3 Encounters:  03/17/23 119/77  02/03/23 124/74  12/15/22 129/87   Wt Readings from Last 3 Encounters:  03/17/23 225 lb 1.6 oz (102.1 kg)  02/03/23 222 lb 14.4 oz (101.1 kg)  12/15/22 217 lb 4.8 oz (98.6 kg)      Physical Exam HENT:     Head: Normocephalic and atraumatic.     Mouth/Throat:     Mouth: Mucous membranes are moist.  Eyes:     Extraocular Movements: Extraocular movements intact.     Conjunctiva/sclera: Conjunctivae normal.     Pupils: Pupils are equal, round, and reactive to light.  Cardiovascular:     Rate and Rhythm: Normal rate and regular rhythm.     Pulses: Normal pulses.     Heart sounds: Normal heart sounds.  Pulmonary:     Effort: Pulmonary effort is normal.     Breath sounds: Normal breath sounds.  Musculoskeletal:        General: Tenderness (to left thumb with palpation) present.  Skin:    General: Skin is warm.  Neurological:     General: No focal deficit present.     Mental Status: She is alert and oriented to person, place, and time. Mental status is at baseline.  Psychiatric:        Mood and Affect: Mood normal.        Behavior: Behavior normal.  Thought Content: Thought content normal.        Judgment: Judgment normal.      Results for orders placed or performed in visit on 03/17/23  POCT Glucose (CBG)  Result Value Ref Range   POC Glucose 179 (A) 70 - 99 mg/dl  POCT HgB W0J  Result Value Ref Range   Hemoglobin A1C 6.9 (A) 4.0 - 5.6 %   HbA1c POC (<> result, manual entry)     HbA1c, POC (prediabetic range)     HbA1c, POC (controlled diabetic range)      Last CBC Lab Results  Component Value Date   WBC 8.5 12/15/2022   HGB 11.0 (L) 12/15/2022   HCT 34.3 12/15/2022   MCV 79 12/15/2022   MCH 25.3 (L) 12/15/2022   RDW 14.7 12/15/2022   PLT 370 12/15/2022   Last metabolic panel Lab Results  Component Value Date   GLUCOSE 115 (H) 12/15/2022   NA 141 12/15/2022   K 4.5 12/15/2022   CL 103 12/15/2022   CO2 24  12/15/2022   BUN 10 12/15/2022   CREATININE 0.70 12/15/2022   EGFR 103 12/15/2022   CALCIUM 9.2 12/15/2022   PROT 6.9 12/15/2022   ALBUMIN 3.8 12/15/2022   LABGLOB 3.1 12/15/2022   AGRATIO 1.2 12/15/2022   BILITOT 0.4 12/15/2022   ALKPHOS 112 12/15/2022   AST 13 12/15/2022   ALT 10 12/15/2022   ANIONGAP 12 05/31/2020   Last lipids Lab Results  Component Value Date   CHOL 170 12/15/2022   HDL 41 12/15/2022   LDLCALC 103 (H) 12/15/2022   TRIG 147 12/15/2022   CHOLHDL 4.1 12/15/2022   Last hemoglobin A1c Lab Results  Component Value Date   HGBA1C 6.9 (A) 03/17/2023   Last thyroid functions Lab Results  Component Value Date   TSH 0.931 12/09/2021   Last vitamin D No results found for: "25OHVITD2", "25OHVITD3", "VD25OH" Last vitamin B12 and Folate No results found for: "VITAMINB12", "FOLATE"    The 10-year ASCVD risk score (Arnett DK, et al., 2019) is: 3.4%    Assessment & Plan:   1. Type 2 diabetes mellitus without complication, without long-term current use of insulin (HCC) - Her diabetes is improving, though her HgbA1c was 6.9%, she will continue on current medication, advised to continue on low carb/non concentrated sweet diet and exercise as tolerated. - POCT Glucose (CBG) - POCT HgB A1C - metFORMIN (GLUCOPHAGE-XR) 500 MG 24 hr tablet; Take 2 tablets (1,000 mg total) by mouth daily with breakfast.  Dispense: 180 tablet; Refill: 0 - Vitamin D (25 hydroxy); Future  2. Elevated lipids - She will continue on current medication,low fat/cholesterol diet and exercise as tolerated. - pravastatin (PRAVACHOL) 20 MG tablet; Take 1 tablet (20 mg total) by mouth daily.  Dispense: 90 tablet; Refill: 0  3. Thumb pain, left - Possible trigger finger or carpel tunnel, she was advised to continue on Ibuprofen as needed and will follow up with Dr Gavin Potters.She was advised to go to the ED for worsening symptoms.  4. Fluttering sensation of heart - She was advised to go to the  ED for worsening symptoms, and EKG will be checked during her next visit. - EKG 12-Lead    Return in about 13 weeks (around 06/16/2023), or if symptoms worsen or fail to improve.    Aayush Gelpi Trellis Paganini, NP

## 2023-03-17 NOTE — Patient Instructions (Signed)

## 2023-03-23 ENCOUNTER — Other Ambulatory Visit: Payer: Self-pay

## 2023-03-23 DIAGNOSIS — E119 Type 2 diabetes mellitus without complications: Secondary | ICD-10-CM

## 2023-03-24 ENCOUNTER — Other Ambulatory Visit: Payer: Self-pay

## 2023-03-24 ENCOUNTER — Other Ambulatory Visit: Payer: Self-pay | Admitting: Gerontology

## 2023-03-24 DIAGNOSIS — E559 Vitamin D deficiency, unspecified: Secondary | ICD-10-CM

## 2023-03-24 LAB — VITAMIN D 25 HYDROXY (VIT D DEFICIENCY, FRACTURES): Vit D, 25-Hydroxy: 7.4 ng/mL — ABNORMAL LOW (ref 30.0–100.0)

## 2023-03-24 MED ORDER — VITAMIN D (ERGOCALCIFEROL) 1.25 MG (50000 UNIT) PO CAPS
50000.0000 [IU] | ORAL_CAPSULE | ORAL | 0 refills | Status: DC
Start: 2023-03-24 — End: 2023-06-16
  Filled 2023-03-24: qty 16, 112d supply, fill #0

## 2023-04-19 ENCOUNTER — Encounter: Payer: Self-pay | Admitting: Rheumatology

## 2023-04-19 ENCOUNTER — Ambulatory Visit: Payer: Self-pay | Admitting: Rheumatology

## 2023-04-19 VITALS — BP 119/82 | HR 101 | Wt 227.1 lb

## 2023-04-19 DIAGNOSIS — M65312 Trigger thumb, left thumb: Secondary | ICD-10-CM

## 2023-04-19 DIAGNOSIS — G5602 Carpal tunnel syndrome, left upper limb: Secondary | ICD-10-CM

## 2023-04-19 NOTE — Patient Instructions (Signed)
Left thumb trigger area injected Instructed in wrist splint use

## 2023-04-19 NOTE — Progress Notes (Signed)
[facility]  PROGRESS NOTE  Patient:Lisa Howell Female   DOB:1971-09-23     52 y.o.  ZOX:[id number]  Visit Date: 04/19/2023  HPI:  52 year old white female.  Previously worked in [employer] and [employer].  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  Last A1c 6.9 For the last 3 months she has had pain in the left hand.  Left thumb will sometimes catch.  Will be very sore in the morning.  She has used a thumb splint was normal improvement.  Took some ibuprofen Left hand goes numb at night and during the day with activity sometimes numbness to come up the arm.  No neck pain.  Right hand is without numbness.  Sister has carpal tunnel     Past Medical History:  Diagnosis Date   Diabetes mellitus without complication (HCC)    Hyperlipidemia     Past Surgical History:  Procedure Laterality Date   ABDOMINAL HYSTERECTOMY     CESAREAN SECTION     CHOLECYSTECTOMY     COLONOSCOPY WITH PROPOFOL N/A 12/30/2020   Procedure: COLONOSCOPY WITH PROPOFOL;  Surgeon: Midge Minium, MD;  Location: [facility] ENDOSCOPY;  Service: Endoscopy;  Laterality: N/A;   HERNIA REPAIR  1998   c-section site    Social History   Tobacco Use   Smoking status: Former    Years: 2    Types: Cigarettes    Quit date: 12/31/1999    Years since quitting: 23.3   Smokeless tobacco: Never  Substance Use Topics   Alcohol use: Yes    Comment: rarely, maybe 1 drink a year     MEDICATIONS: Current Outpatient Medications  Medication Sig Dispense Refill   albuterol (VENTOLIN HFA) 108 (90 Base) MCG/ACT inhaler Inhale 1-2 puffs into the lungs every 6 (six) hours as needed for wheezing or shortness of breath. 6.7 g 0   blood glucose meter kit and supplies KIT Dispense based on patient and insurance preference. Use up to four times daily as directed. (FOR ICD-9 250.00, 250.01). 1 each 0   glucose blood (RIGHTEST GS550 BLOOD GLUCOSE) test strip USE  AS DIRECTED TWICE DAILY. 100 each 0   ibuprofen (ADVIL) 200 MG tablet Take 800 mg by mouth daily as needed.     metFORMIN (GLUCOPHAGE-XR) 500 MG 24 hr tablet Take 2 tablets (1,000 mg total) by mouth daily with breakfast. 180 tablet 0   pravastatin (PRAVACHOL) 20 MG tablet Take 1 tablet (20 mg total) by mouth daily. 90 tablet 0   Rightest GL300 Lancets MISC USE AS DIRECTED TWICE DAILY. 100 each 0   Vitamin D, Ergocalciferol, (DRISDOL) 1.25 MG (50000 UNIT) CAPS capsule Take 1 capsule (50,000 Units total) by mouth every 7 (seven) days. 16 capsule 0   No current facility-administered medications for this visit.     ALLERGIES Allergies  Allergen Reactions   Onion Anaphylaxis   Aspirin Other (See Comments)    Drowsy  Drowsy  Drowsy      PHYSICAL EXAM: There were no vitals taken for this visit. Pleasant female.  Good range of motion cervical spine and elbows.  Wrists without synovitis MCPs without synovitis.  Left thumb flexor tendon nodules tender.  Flexion extension of the thumb is tender.  Thumb CMC is unremarkable.  Positive Tinel's.  Decreased pin median nerve distribution.  Positive Phalen's.  Right hand with negative Tinel's.  Reasonable grip bilaterally.  No inflammatory synovitis   ASSESSMENT: Left  thumb flexor tenosynovitis with trigger nodule.  Unresponsive to ibuprofen and splinting Left carpal tunnel syndrome Diabetes    PLAN: Procedure: Left thumb flexor tendon trigger area prepped in sterile manner.  Injected with point 1 cc Xylocaine 0.3 cc Kenalog.  She denies Xylocaine allergy prior to injection Continue thumb splint Left wrist splint at night.  She will get 1 over-the-counter If unimproved she will let the office know and she would need to be referred to orthopedics for possible surgery carpal tunnel and trigger nodule    G. Al Pimple. MD           04/19/2023,  9:25 AM

## 2023-05-03 ENCOUNTER — Ambulatory Visit: Payer: Self-pay | Admitting: Gerontology

## 2023-05-03 ENCOUNTER — Other Ambulatory Visit: Payer: Self-pay

## 2023-05-03 VITALS — BP 102/71 | HR 96 | Temp 98.1°F | Resp 16 | Wt 230.6 lb

## 2023-05-03 DIAGNOSIS — M79645 Pain in left finger(s): Secondary | ICD-10-CM

## 2023-05-03 DIAGNOSIS — G5602 Carpal tunnel syndrome, left upper limb: Secondary | ICD-10-CM

## 2023-05-03 DIAGNOSIS — M65312 Trigger thumb, left thumb: Secondary | ICD-10-CM

## 2023-05-03 MED ORDER — IBUPROFEN 800 MG PO TABS
800.0000 mg | ORAL_TABLET | Freq: Every day | ORAL | 0 refills | Status: DC | PRN
Start: 2023-05-03 — End: 2023-05-17
  Filled 2023-05-03: qty 20, 20d supply, fill #0

## 2023-05-03 NOTE — Progress Notes (Signed)
Established Patient Office Visit  Subjective   Patient ID: Lisa Howell, female    DOB: [birth year]  Age: 52 y.o. MRN: [id number]  No chief complaint on file.   HPI HPI   Lisa Howell is a 52 y/o female with history of Type 2 Diabetes and hyperlipidemia who presents for follow up.  She reports she had a visit with Dr. Gavin Potters for pain in her left hand.  She reports she was diagnosed with trigger finger and carpal tunnel.  On 04/19/2023, she received an injection to assist with the pain in her thumb of 0.1 mL of Xylocaine mixed with 0.3 mL of Kenalog.  She reports it helped relieve the pain for a couple of days but the pain returned.  She reports a stabbing, numbing pain  of 7/10.  She reports that her thumb and wrist is stiff when she wakes up in the morning but  it improves as the day progresses. She reports she  that has not taken any medication to relieve the pain.  She reports repetitive movements causes the pain to increase.  She reports this is unavoidable as she works with her hands using repetitive movements.  She states she started using a wrist brace and it is helping a little.  She reports she does not want to have surgery because she was informed that she would not have a job if she has to have surgery.  She denies muscle/motor weakness and paresthesia.  Overall, she's doing well and does not offer any additional complaint.   Review of Systems  Constitutional: Negative.   HENT: Negative.    Eyes: Negative.   Respiratory: Negative.    Cardiovascular: Negative.   Gastrointestinal: Negative.   Genitourinary: Negative.   Musculoskeletal:  Positive for joint pain.       Left thumb and wrist pain   Skin: Negative.   Neurological: Negative.   Psychiatric/Behavioral: Negative.        Objective:     BP 102/71 (BP Location: Right Arm, Patient Position: Sitting, Cuff Size: Large)   Pulse 96   Temp 98.1 F (36.7 C) (Oral)   Resp 16   Wt 230 lb 9.6 oz (104.6 kg)    SpO2 94%   BMI 43.57 kg/m  BP Readings from Last 3 Encounters:  05/03/23 102/71  04/19/23 119/82  03/17/23 119/77   Wt Readings from Last 3 Encounters:  05/03/23 230 lb 9.6 oz (104.6 kg)  04/19/23 227 lb 1.6 oz (103 kg)  03/17/23 225 lb 1.6 oz (102.1 kg)      Physical Exam Constitutional:      Appearance: Normal appearance.  HENT:     Head: Normocephalic.  Cardiovascular:     Rate and Rhythm: Normal rate and regular rhythm.     Pulses: Normal pulses.     Heart sounds: Normal heart sounds.  Pulmonary:     Effort: Pulmonary effort is normal.     Breath sounds: Normal breath sounds.  Abdominal:     General: Bowel sounds are normal.     Palpations: Abdomen is soft.  Musculoskeletal:     Cervical back: Normal range of motion.     Comments: Limited ROM of left thumb and wrist  Extension, abduction, adduction, clockwise and counter clockwise rotation of the thumb causes pain.  Skin:    General: Skin is warm and dry.  Neurological:     Mental Status: She is alert and oriented to person, place, and time.  Psychiatric:  Mood and Affect: Mood normal.        Behavior: Behavior normal.      No results found for any visits on 05/03/23.  Last CBC Lab Results  Component Value Date   WBC 8.5 12/15/2022   HGB 11.0 (L) 12/15/2022   HCT 34.3 12/15/2022   MCV 79 12/15/2022   MCH 25.3 (L) 12/15/2022   RDW 14.7 12/15/2022   PLT 370 12/15/2022   Last metabolic panel Lab Results  Component Value Date   GLUCOSE 115 (H) 12/15/2022   NA 141 12/15/2022   K 4.5 12/15/2022   CL 103 12/15/2022   CO2 24 12/15/2022   BUN 10 12/15/2022   CREATININE 0.70 12/15/2022   EGFR 103 12/15/2022   CALCIUM 9.2 12/15/2022   PROT 6.9 12/15/2022   ALBUMIN 3.8 12/15/2022   LABGLOB 3.1 12/15/2022   AGRATIO 1.2 12/15/2022   BILITOT 0.4 12/15/2022   ALKPHOS 112 12/15/2022   AST 13 12/15/2022   ALT 10 12/15/2022   ANIONGAP 12 05/31/2020   Last lipids Lab Results  Component Value  Date   CHOL 170 12/15/2022   HDL 41 12/15/2022   LDLCALC 103 (H) 12/15/2022   TRIG 147 12/15/2022   CHOLHDL 4.1 12/15/2022   Last hemoglobin A1c Lab Results  Component Value Date   HGBA1C 6.9 (A) 03/17/2023   Last thyroid functions Lab Results  Component Value Date   TSH 0.931 12/09/2021   Last vitamin D Lab Results  Component Value Date   VD25OH 7.4 (L) 03/23/2023   Last vitamin B12 and Folate No results found for: "VITAMINB12", "FOLATE"    The 10-year ASCVD risk score (Arnett DK, et al., 2019) is: 2.5%    Assessment & Plan:  1. Carpal tunnel syndrome of left wrist She was encouraged to continue to use the wrist splint to provide additional support and reduction of pain.  She was encouraged to take prescribed medication if needed to reduce inflammation and pain.She was started on Ibuprofen, educated on medication side effects and to notify clinic. She was advised to go to the ED for worsening symptoms. - Ambulatory referral to Orthopedic Surgery - ibuprofen (ADVIL) 800 MG tablet; Take 1 tablet (800 mg total) by mouth daily as needed.  Dispense: 20 tablet; Refill: 0  2. Trigger thumb of left hand - Ambulatory referral to Orthopedic Surgery. She was started on Ibuprofen, educated on medication side effects and to notify clinic.She was advised to go to the ED for worsening symptoms. - ibuprofen (ADVIL) 800 MG tablet; Take 1 tablet (800 mg total) by mouth daily as needed.  Dispense: 20 tablet; Refill: 0  3. Thumb pain, left She was encouraged to continue to use the wrist splint to provide additional support and reduction of pain.  She was encouraged to take prescribed medication if needed to reduce inflammation and pain.She was advised to go to the ED for worsening symptoms.  Follow up visit on 05/17/2023 with Dr. Gavin Potters.  Lisa Son, RN   Return in about 6 weeks (around 06/16/2023), or if symptoms worsen or fail to improve.    Lisa Trellis Paganini, NP

## 2023-05-03 NOTE — Patient Instructions (Signed)

## 2023-05-17 ENCOUNTER — Encounter: Payer: Self-pay | Admitting: Rheumatology

## 2023-05-17 ENCOUNTER — Other Ambulatory Visit: Payer: Self-pay

## 2023-05-17 ENCOUNTER — Ambulatory Visit: Payer: Self-pay | Admitting: Rheumatology

## 2023-05-17 ENCOUNTER — Other Ambulatory Visit: Payer: Self-pay | Admitting: Gerontology

## 2023-05-17 DIAGNOSIS — M65312 Trigger thumb, left thumb: Secondary | ICD-10-CM

## 2023-05-17 DIAGNOSIS — G5602 Carpal tunnel syndrome, left upper limb: Secondary | ICD-10-CM

## 2023-05-17 MED ORDER — IBUPROFEN 800 MG PO TABS
800.0000 mg | ORAL_TABLET | Freq: Every day | ORAL | 0 refills | Status: AC | PRN
Start: 2023-05-17 — End: ?
  Filled 2023-05-17: qty 50, 50d supply, fill #0

## 2023-05-17 MED ORDER — PREDNISONE 5 MG PO TABS
ORAL_TABLET | ORAL | 0 refills | Status: AC
  Filled 2023-05-17: qty 22, 7d supply, fill #0

## 2023-05-17 NOTE — Progress Notes (Signed)
Rheumatology follow-up, left hand trigger thumb not catching is much.  Still some popping A good deal of burning in the left hand with numbness.  Has to wire panels.  Wear splint.  Diabetic.  Last sugars running in the 300s  Exam: Flexor tendon nodule left palm.  Positive Tinel's.  Decreased pin leading to its distribution.  No synovitis  Impression left thumb flexor tenosynovitis some improved Left hand carpal tunnel.  Worse symptomatically despite conservative treatments  Plan.  Short course prednisone taper.  Orthopedic referral.  May need carpal tunnel release and trigger nodule release.  She is applying for charity care.

## 2023-05-18 ENCOUNTER — Other Ambulatory Visit: Payer: Self-pay

## 2023-05-26 ENCOUNTER — Other Ambulatory Visit: Payer: Self-pay

## 2023-06-02 ENCOUNTER — Encounter: Payer: Self-pay | Admitting: Gerontology

## 2023-06-02 ENCOUNTER — Other Ambulatory Visit: Payer: Self-pay

## 2023-06-02 ENCOUNTER — Ambulatory Visit: Payer: Self-pay | Admitting: Gerontology

## 2023-06-02 VITALS — BP 124/84 | HR 100 | Temp 98.9°F | Ht 61.0 in | Wt 226.9 lb

## 2023-06-02 DIAGNOSIS — R0981 Nasal congestion: Secondary | ICD-10-CM

## 2023-06-02 DIAGNOSIS — R051 Acute cough: Secondary | ICD-10-CM

## 2023-06-02 DIAGNOSIS — J3489 Other specified disorders of nose and nasal sinuses: Secondary | ICD-10-CM

## 2023-06-02 MED ORDER — GUAIFENESIN 100 MG/5ML PO LIQD
100.0000 mg | ORAL | 0 refills | Status: DC | PRN
Start: 2023-06-02 — End: 2023-06-16
  Filled 2023-06-02: qty 118, 4d supply, fill #0

## 2023-06-02 MED ORDER — SALINE SPRAY 0.65 % NA SOLN
1.0000 | NASAL | 0 refills | Status: AC | PRN
Start: 2023-06-02 — End: ?
  Filled 2023-06-02: qty 30, 30d supply, fill #0

## 2023-06-02 MED ORDER — ALBUTEROL SULFATE HFA 108 (90 BASE) MCG/ACT IN AERS
1.0000 | INHALATION_SPRAY | Freq: Four times a day (QID) | RESPIRATORY_TRACT | 0 refills | Status: AC | PRN
Start: 2023-06-02 — End: ?
  Filled 2023-06-02: qty 6.7, 25d supply, fill #0

## 2023-06-02 MED ORDER — BENZONATATE 100 MG PO CAPS
100.0000 mg | ORAL_CAPSULE | Freq: Three times a day (TID) | ORAL | 0 refills | Status: DC | PRN
Start: 2023-06-02 — End: 2023-06-16
  Filled 2023-06-02: qty 20, 7d supply, fill #0

## 2023-06-02 MED ORDER — FLUTICASONE PROPIONATE 50 MCG/ACT NA SUSP
2.0000 | Freq: Every day | NASAL | 0 refills | Status: AC
Start: 2023-06-02 — End: ?
  Filled 2023-06-02: qty 16, 30d supply, fill #0

## 2023-06-02 NOTE — Progress Notes (Signed)
Established Patient Office Visit  Subjective   Patient ID: Lisa Howell, female    DOB: 02-24-1971  Age: 52 y.o. MRN: [id number]  Chief Complaint  Patient presents with   Sinusitis    Runny nose, sneezing, coughing, chills     HPI  Lisa Howell is a 52 y/o female with history of Type 2 Diabetes and hyperlipidemia who presents c/o rhinnorhea,  non productive cough/sneezing, sinnus pressure and chills and intermittent shortness of breath. She states that symptoms started 2 days ago, denies sick contacts, fever, wheezing, chest tightness , myalgia , sore throat and have not tested for Covid. She states that she has taking OTC tylenol/cold/flu severe and Ibuprofen with minimal relief. Overall , she states that she is doing well and offers no further complaint.     Review of Systems  Constitutional:  Positive for chills.  HENT:  Positive for congestion and sinus pain.   Respiratory:  Positive for cough and shortness of breath.   Cardiovascular: Negative.   Skin: Negative.   Neurological: Negative.       Objective:     BP 124/84   Pulse 100   Temp 98.9 F (37.2 C) (Oral)   Ht 5\' 1"  (1.549 m)   Wt 226 lb 14.4 oz (102.9 kg)   SpO2 93%   BMI 42.87 kg/m  BP Readings from Last 3 Encounters:  06/02/23 124/84  05/17/23 98/64  05/03/23 102/71   Wt Readings from Last 3 Encounters:  06/02/23 226 lb 14.4 oz (102.9 kg)  05/17/23 230 lb (104.3 kg)  05/03/23 230 lb 9.6 oz (104.6 kg)      Physical Exam HENT:     Head: Normocephalic.     Nose:     Right Sinus: Maxillary sinus tenderness and frontal sinus tenderness present.     Left Sinus: Maxillary sinus tenderness and frontal sinus tenderness present.     Mouth/Throat:     Mouth: Mucous membranes are moist.     Tongue: No lesions.     Pharynx: Oropharynx is clear. Uvula midline.  Eyes:     Pupils: Pupils are equal, round, and reactive to light.  Cardiovascular:     Rate and Rhythm: Normal rate and regular  rhythm.     Pulses: Normal pulses.     Heart sounds: Normal heart sounds.  Pulmonary:     Effort: Pulmonary effort is normal.     Breath sounds: Normal breath sounds.  Skin:    General: Skin is warm.  Neurological:     General: No focal deficit present.     Mental Status: She is alert and oriented to person, place, and time. Mental status is at baseline.  Psychiatric:        Mood and Affect: Mood normal.        Behavior: Behavior normal.        Thought Content: Thought content normal.        Judgment: Judgment normal.      No results found for any visits on 06/02/23.  Last CBC Lab Results  Component Value Date   WBC 8.5 12/15/2022   HGB 11.0 (L) 12/15/2022   HCT 34.3 12/15/2022   MCV 79 12/15/2022   MCH 25.3 (L) 12/15/2022   RDW 14.7 12/15/2022   PLT 370 12/15/2022   Last metabolic panel Lab Results  Component Value Date   GLUCOSE 115 (H) 12/15/2022   NA 141 12/15/2022   K 4.5 12/15/2022   CL 103 12/15/2022  CO2 24 12/15/2022   BUN 10 12/15/2022   CREATININE 0.70 12/15/2022   EGFR 103 12/15/2022   CALCIUM 9.2 12/15/2022   PROT 6.9 12/15/2022   ALBUMIN 3.8 12/15/2022   LABGLOB 3.1 12/15/2022   AGRATIO 1.2 12/15/2022   BILITOT 0.4 12/15/2022   ALKPHOS 112 12/15/2022   AST 13 12/15/2022   ALT 10 12/15/2022   ANIONGAP 12 05/31/2020   Last lipids Lab Results  Component Value Date   CHOL 170 12/15/2022   HDL 41 12/15/2022   LDLCALC 103 (H) 12/15/2022   TRIG 147 12/15/2022   CHOLHDL 4.1 12/15/2022   Last hemoglobin A1c Lab Results  Component Value Date   HGBA1C 6.9 (A) 03/17/2023   Last thyroid functions Lab Results  Component Value Date   TSH 0.931 12/09/2021   Last vitamin D Lab Results  Component Value Date   VD25OH 7.4 (L) 03/23/2023      The 10-year ASCVD risk score (Arnett DK, et al., 2019) is: 3.7%    Assessment & Plan:  1. Nasal congestion -Upper respiratory symptoms likely viral in origin, she was strongly encouraged to test for  COVID-19 and will mask.  She will continue to use albuterol and Flonase as needed. - albuterol (VENTOLIN HFA) 108 (90 Base) MCG/ACT inhaler; Inhale 1-2 puffs into the lungs every 6 (six) hours as needed for wheezing or shortness of breath.  Dispense: 6.7 g; Refill: 0 - fluticasone (FLONASE) 50 MCG/ACT nasal spray; Place 2 sprays into both nostrils daily.  Dispense: 16 g; Refill: 0  2. Acute cough -Her cough is nonproductive, no wheezing, crackles or rhonchi, lung sounds are clear.  She was started on Robitussin during the day and Tessalon Perles at night. - guaiFENesin (ROBITUSSIN) 100 MG/5ML liquid; Take 5 mLs (100 mg total) by mouth every 4 (four) hours as needed for cough or to loosen phlegm.  Dispense: 240 mL; Refill: 0 - benzonatate (TESSALON PERLES) 100 MG capsule; Take 1 capsule (100 mg total) by mouth 3 (three) times daily as needed.  Dispense: 20 capsule; Refill: 0  3. Sinus pressure -She was started on sodium chloride nasal spray, and was advised to take Tylenol as needed for pain.  She was advised to call the clinic for worsening symptoms or go to the emergency room. - sodium chloride (OCEAN) 0.65 % SOLN nasal spray; Place 1 spray into both nostrils as needed for congestion.  Dispense: 30 mL; Refill: 0    Return in about 2 weeks (around 06/16/2023), or if symptoms worsen or fail to improve.    Tanuj Mullens Trellis Paganini, NP

## 2023-06-16 ENCOUNTER — Encounter: Payer: Self-pay | Admitting: Gerontology

## 2023-06-16 ENCOUNTER — Other Ambulatory Visit: Payer: Self-pay

## 2023-06-16 ENCOUNTER — Ambulatory Visit: Payer: Self-pay | Admitting: Gerontology

## 2023-06-16 VITALS — BP 130/86 | HR 88 | Ht 61.0 in | Wt 231.0 lb

## 2023-06-16 DIAGNOSIS — E119 Type 2 diabetes mellitus without complications: Secondary | ICD-10-CM

## 2023-06-16 DIAGNOSIS — E785 Hyperlipidemia, unspecified: Secondary | ICD-10-CM

## 2023-06-16 DIAGNOSIS — E559 Vitamin D deficiency, unspecified: Secondary | ICD-10-CM

## 2023-06-16 LAB — POCT GLYCOSYLATED HEMOGLOBIN (HGB A1C): Hemoglobin A1C: 7.2 % — AB (ref 4.0–5.6)

## 2023-06-16 LAB — GLUCOSE, POCT (MANUAL RESULT ENTRY): POC Glucose: 140 mg/dL — AB (ref 70–99)

## 2023-06-16 MED ORDER — PRAVASTATIN SODIUM 20 MG PO TABS
20.0000 mg | ORAL_TABLET | Freq: Every day | ORAL | 0 refills | Status: DC
  Filled 2023-06-16: qty 90, 90d supply, fill #0

## 2023-06-16 MED ORDER — METFORMIN HCL ER 500 MG PO TB24
1000.0000 mg | ORAL_TABLET | Freq: Every day | ORAL | 0 refills | Status: DC
  Filled 2023-06-16: qty 180, 90d supply, fill #0

## 2023-06-16 NOTE — Patient Instructions (Addendum)
Heart-Healthy Eating Plan Many factors influence your heart health, including eating and exercise habits. Heart health is also called coronary health. Coronary risk increases with abnormal blood fat (lipid) levels. A heart-healthy eating plan includes limiting unhealthy fats, increasing healthy fats, limiting salt (sodium) intake, and making other diet and lifestyle changes. What is my plan? Your health care provider may recommend that: You limit your fat intake to _________% or less of your total calories each day. You limit your saturated fat intake to _________% or less of your total calories each day. You limit the amount of cholesterol in your diet to less than _________ mg per day. You limit the amount of sodium in your diet to less than _________ mg per day. What are tips for following this plan? Cooking Cook foods using methods other than frying. Baking, boiling, grilling, and broiling are all good options. Other ways to reduce fat include: Removing the skin from poultry. Removing all visible fats from meats. Steaming vegetables in water or broth. Meal planning  At meals, imagine dividing your plate into fourths: Fill one-half of your plate with vegetables and green salads. Fill one-fourth of your plate with whole grains. Fill one-fourth of your plate with lean protein foods. Eat 2-4 cups of vegetables per day. One cup of vegetables equals 1 cup (91 g) broccoli or cauliflower florets, 2 medium carrots, 1 large bell pepper, 1 large sweet potato, 1 large tomato, 1 medium white potato, 2 cups (150 g) raw leafy greens. Eat 1-2 cups of fruit per day. One cup of fruit equals 1 small apple, 1 large banana, 1 cup (237 g) mixed fruit, 1 large orange,  cup (82 g) dried fruit, 1 cup (240 mL) 100% fruit juice. Eat more foods that contain soluble fiber. Examples include apples, broccoli, carrots, beans, peas, and barley. Aim to get 25-30 g of fiber per day. Increase your consumption of legumes,  nuts, and seeds to 4-5 servings per week. One serving of dried beans or legumes equals  cup (90 g) cooked, 1 serving of nuts is  oz (12 almonds, 24 pistachios, or 7 walnut halves), and 1 serving of seeds equals  oz (8 g). Fats Choose healthy fats more often. Choose monounsaturated and polyunsaturated fats, such as olive and canola oils, avocado oil, flaxseeds, walnuts, almonds, and seeds. Eat more omega-3 fats. Choose salmon, mackerel, sardines, tuna, flaxseed oil, and ground flaxseeds. Aim to eat fish at least 2 times each week. Check food labels carefully to identify foods with trans fats or high amounts of saturated fat. Limit saturated fats. These are found in animal products, such as meats, butter, and cream. Plant sources of saturated fats include palm oil, palm kernel oil, and coconut oil. Avoid foods with partially hydrogenated oils in them. These contain trans fats. Examples are stick margarine, some tub margarines, cookies, crackers, and other baked goods. Avoid fried foods. General information Eat more home-cooked food and less restaurant, buffet, and fast food. Limit or avoid alcohol. Limit foods that are high in added sugar and simple starches such as foods made using white refined flour (white breads, pastries, sweets). Lose weight if you are overweight. Losing just 5-10% of your body weight can help your overall health and prevent diseases such as diabetes and heart disease. Monitor your sodium intake, especially if you have high blood pressure. Talk with your health care provider about your sodium intake. Try to incorporate more vegetarian meals weekly. What foods should I eat? Fruits All fresh, canned (in natural  juice), or frozen fruits. Vegetables Fresh or frozen vegetables (raw, steamed, roasted, or grilled). Green salads. Grains Most grains. Choose whole wheat and whole grains most of the time. Rice and pasta, including brown rice and pastas made with whole wheat. Meats  and other proteins Lean, well-trimmed beef, veal, pork, and lamb. Chicken and [place] without skin. All fish and shellfish. Wild duck, rabbit, pheasant, and venison. Egg whites or low-cholesterol egg substitutes. Dried beans, peas, lentils, and tofu. Seeds and most nuts. Dairy Low-fat or nonfat cheeses, including ricotta and mozzarella. Skim or 1% milk (liquid, powdered, or evaporated). Buttermilk made with low-fat milk. Nonfat or low-fat yogurt. Fats and oils Non-hydrogenated (trans-free) margarines. Vegetable oils, including soybean, sesame, sunflower, olive, avocado, peanut, safflower, corn, canola, and cottonseed. Salad dressings or mayonnaise made with a vegetable oil. Beverages Water (mineral or sparkling). Coffee and tea. Unsweetened ice tea. Diet beverages. Sweets and desserts Sherbet, gelatin, and fruit ice. Small amounts of dark chocolate. Limit all sweets and desserts. Seasonings and condiments All seasonings and condiments. The items listed above may not be a complete list of foods and beverages you can eat. Contact a dietitian for more options. What foods should I avoid? Fruits Canned fruit in heavy syrup. Fruit in cream or butter sauce. Fried fruit. Limit coconut. Vegetables Vegetables cooked in cheese, cream, or butter sauce. Fried vegetables. Grains Breads made with saturated or trans fats, oils, or whole milk. Croissants. Sweet rolls. Donuts. High-fat crackers, such as cheese crackers and chips. Meats and other proteins Fatty meats, such as hot dogs, ribs, sausage, bacon, rib-eye roast or steak. High-fat deli meats, such as salami and bologna. Caviar. Domestic duck and goose. Organ meats, such as liver. Dairy Cream, sour cream, cream cheese, and creamed cottage cheese. Whole-milk cheeses. Whole or 2% milk (liquid, evaporated, or condensed). Whole buttermilk. Cream sauce or high-fat cheese sauce. Whole-milk yogurt. Fats and oils Meat fat, or shortening. Cocoa butter,  hydrogenated oils, palm oil, coconut oil, palm kernel oil. Solid fats and shortenings, including bacon fat, salt pork, lard, and butter. Nondairy cream substitutes. Salad dressings with cheese or sour cream. Beverages Regular sodas and any drinks with added sugar. Sweets and desserts Frosting. Pudding. Cookies. Cakes. Pies. Milk chocolate or white chocolate. Buttered syrups. Full-fat ice cream or ice cream drinks. The items listed above may not be a complete list of foods and beverages to avoid. Contact a dietitian for more information. Summary Heart-healthy meal planning includes limiting unhealthy fats, increasing healthy fats, limiting salt (sodium) intake and making other diet and lifestyle changes. Lose weight if you are overweight. Losing just 5-10% of your body weight can help your overall health and prevent diseases such as diabetes and heart disease. Focus on eating a balance of foods, including fruits and vegetables, low-fat or nonfat dairy, lean protein, nuts and legumes, whole grains, and heart-healthy oils and fats. This information is not intended to replace advice given to you by your health care provider. Make sure you discuss any questions you have with your health care provider. Document Revised: 11/02/2021 Document Reviewed: 11/02/2021 Elsevier Patient Education  2024 [employer] Carbohydrate Counting for Diabetes Mellitus, Adult Carbohydrate counting is a method of keeping track of how many carbohydrates you eat. Eating carbohydrates increases the amount of sugar (glucose) in the blood. Counting how many carbohydrates you eat improves how well you manage your blood glucose. This, in turn, helps you manage your diabetes. Carbohydrates are measured in grams (g) per serving. It is important to know how  many carbohydrates (in grams or by serving size) you can have in each meal. This is different for every person. A dietitian can help you make a meal plan and calculate how many  carbohydrates you should have at each meal and snack. What foods contain carbohydrates? Carbohydrates are found in the following foods: Grains, such as breads and cereals. Dried beans and soy products. Starchy vegetables, such as potatoes, peas, and corn. Fruit and fruit juices. Milk and yogurt. Sweets and snack foods, such as cake, cookies, candy, chips, and soft drinks. How do I count carbohydrates in foods? There are two ways to count carbohydrates in food. You can read food labels or learn standard serving sizes of foods. You can use either of these methods or a combination of both. Using the Nutrition Facts label The Nutrition Facts list is included on the labels of almost all packaged foods and beverages in the [place]. It includes: The serving size. Information about nutrients in each serving, including the grams of carbohydrate per serving. To use the Nutrition Facts, decide how many servings you will have. Then, multiply the number of servings by the number of carbohydrates per serving. The resulting number is the total grams of carbohydrates that you will be having. Learning the standard serving sizes of foods When you eat carbohydrate foods that are not packaged or do not include Nutrition Facts on the label, you need to measure the servings in order to count the grams of carbohydrates. Measure the foods that you will eat with a food scale or measuring cup, if needed. Decide how many standard-size servings you will eat. Multiply the number of servings by 15. For foods that contain carbohydrates, one serving equals 15 g of carbohydrates. For example, if you eat 2 cups or 10 oz (300 g) of strawberries, you will have eaten 2 servings and 30 g of carbohydrates (2 servings x 15 g = 30 g). For foods that have more than one food mixed, such as soups and casseroles, you must count the carbohydrates in each food that is included. The following list contains standard serving sizes of  common carbohydrate-rich foods. Each of these servings has about 15 g of carbohydrates: 1 slice of bread. 1 six-inch (15 cm) tortilla. ? cup or 2 oz (53 g) cooked rice or pasta.  cup or 3 oz (85 g) cooked or canned, drained and rinsed beans or lentils.  cup or 3 oz (85 g) starchy vegetable, such as peas, corn, or squash.  cup or 4 oz (120 g) hot cereal.  cup or 3 oz (85 g) boiled or mashed potatoes, or  or 3 oz (85 g) of a large baked potato.  cup or 4 fl oz (118 mL) fruit juice. 1 cup or 8 fl oz (237 mL) milk. 1 small or 4 oz (106 g) apple.  or 2 oz (63 g) of a medium banana. 1 cup or 5 oz (150 g) strawberries. 3 cups or 1 oz (28.3 g) popped popcorn. What is an example of carbohydrate counting? To calculate the grams of carbohydrates in this sample meal, follow the steps shown below. Sample meal 3 oz (85 g) chicken breast. ? cup or 4 oz (106 g) brown rice.  cup or 3 oz (85 g) corn. 1 cup or 8 fl oz (237 mL) milk. 1 cup or 5 oz (150 g) strawberries with sugar-free whipped topping. Carbohydrate calculation Identify the foods that contain carbohydrates: Rice. Corn. Milk. Strawberries. Calculate how many servings you have  of each food: 2 servings rice. 1 serving corn. 1 serving milk. 1 serving strawberries. Multiply each number of servings by 15 g: 2 servings rice x 15 g = 30 g. 1 serving corn x 15 g = 15 g. 1 serving milk x 15 g = 15 g. 1 serving strawberries x 15 g = 15 g. Add together all of the amounts to find the total grams of carbohydrates eaten: 30 g + 15 g + 15 g + 15 g = 75 g of carbohydrates total. What are tips for following this plan? Shopping Develop a meal plan and then make a shopping list. Buy fresh and frozen vegetables, fresh and frozen fruit, dairy, eggs, beans, lentils, and whole grains. Look at food labels. Choose foods that have more fiber and less sugar. Avoid processed foods and foods with added sugars. Meal planning Aim to have the same  number of grams of carbohydrates at each meal and for each snack time. Plan to have regular, balanced meals and snacks. Where to find more information American Diabetes Association: diabetes.org Centers for Disease Control and Prevention: TonerPromos.no Academy of Nutrition and Dietetics: eatright.org Association of Diabetes Care & Education Specialists: diabeteseducator.org Summary Carbohydrate counting is a method of keeping track of how many carbohydrates you eat. Eating carbohydrates increases the amount of sugar (glucose) in your blood. Counting how many carbohydrates you eat improves how well you manage your blood glucose. This helps you manage your diabetes. A dietitian can help you make a meal plan and calculate how many carbohydrates you should have at each meal and snack. This information is not intended to replace advice given to you by your health care provider. Make sure you discuss any questions you have with your health care provider. Document Revised: 04/30/2020 Document Reviewed: 04/30/2020 Elsevier Patient Education  2024 [employer] Inc. DASH Eating Plan DASH stands for Dietary Approaches to Stop Hypertension. The DASH eating plan is a healthy eating plan that has been shown to: Lower high blood pressure (hypertension). Reduce your risk for type 2 diabetes, heart disease, and stroke. Help with weight loss. What are tips for following this plan? Reading food labels Check food labels for the amount of salt (sodium) per serving. Choose foods with less than 5 percent of the Daily Value (DV) of sodium. In general, foods with less than 300 milligrams (mg) of sodium per serving fit into this eating plan. To find whole grains, look for the word "whole" as the first word in the ingredient list. Shopping Buy products labeled as "low-sodium" or "no salt added." Buy fresh foods. Avoid canned foods and pre-made or frozen meals. Cooking Try not to add salt when you cook. Use salt-free  seasonings or herbs instead of table salt or sea salt. Check with your health care provider or pharmacist before using salt substitutes. Do not fry foods. Cook foods in healthy ways, such as baking, boiling, grilling, roasting, or broiling. Cook using oils that are good for your heart. These include olive, canola, avocado, soybean, and sunflower oil. Meal planning  Eat a balanced diet. This should include: 4 or more servings of fruits and 4 or more servings of vegetables each day. Try to fill half of your plate with fruits and vegetables. 6-8 servings of whole grains each day. 6 or less servings of lean meat, poultry, or fish each day. 1 oz is 1 serving. A 3 oz (85 g) serving of meat is about the same size as the palm of your hand. One egg  is 1 oz (28 g). 2-3 servings of low-fat dairy each day. One serving is 1 cup (237 mL). 1 serving of nuts, seeds, or beans 5 times each week. 2-3 servings of heart-healthy fats. Healthy fats called omega-3 fatty acids are found in foods such as walnuts, flaxseeds, fortified milks, and eggs. These fats are also found in cold-water fish, such as sardines, salmon, and mackerel. Limit how much you eat of: Canned or prepackaged foods. Food that is high in trans fat, such as fried foods. Food that is high in saturated fat, such as fatty meat. Desserts and other sweets, sugary drinks, and other foods with added sugar. Full-fat dairy products. Do not salt foods before eating. Do not eat more than 4 egg yolks a week. Try to eat at least 2 vegetarian meals a week. Eat more home-cooked food and less restaurant, buffet, and fast food. Lifestyle When eating at a restaurant, ask if your food can be made with less salt or no salt. If you drink alcohol: Limit how much you have to: 0-1 drink a day if you are female. 0-2 drinks a day if you are female. Know how much alcohol is in your drink. In the [place] one drink is one 12 oz bottle of beer (355 mL), one 5 oz glass of wine  (148 mL), or one 1 oz glass of hard liquor (44 mL). General information Avoid eating more than 2,300 mg of salt a day. If you have hypertension, you may need to reduce your sodium intake to 1,500 mg a day. Work with your provider to stay at a healthy body weight or lose weight. Ask what the best weight range is for you. On most days of the week, get at least 30 minutes of exercise that causes your heart to beat faster. This may include walking, swimming, or biking. Work with your provider or dietitian to adjust your eating plan to meet your specific calorie needs. What foods should I eat? Fruits All fresh, dried, or frozen fruit. Canned fruits that are in their natural juice and do not have sugar added to them. Vegetables Fresh or frozen vegetables that are raw, steamed, roasted, or grilled. Low-sodium or reduced-sodium tomato and vegetable juice. Low-sodium or reduced-sodium tomato sauce and tomato paste. Low-sodium or reduced-sodium canned vegetables. Grains Whole-grain or whole-wheat bread. Whole-grain or whole-wheat pasta. Brown rice. Orpah Cobb. Bulgur. Whole-grain and low-sodium cereals. Pita bread. Low-fat, low-sodium crackers. Whole-wheat flour tortillas. Meats and other proteins Skinless chicken or [place]. Ground chicken or [place]. Pork with fat trimmed off. Fish and seafood. Egg whites. Dried beans, peas, or lentils. Unsalted nuts, nut butters, and seeds. Unsalted canned beans. Lean cuts of beef with fat trimmed off. Low-sodium, lean precooked or cured meat, such as sausages or meat loaves. Dairy Low-fat (1%) or fat-free (skim) milk. Reduced-fat, low-fat, or fat-free cheeses. Nonfat, low-sodium ricotta or cottage cheese. Low-fat or nonfat yogurt. Low-fat, low-sodium cheese. Fats and oils Soft margarine without trans fats. Vegetable oil. Reduced-fat, low-fat, or light mayonnaise and salad dressings (reduced-sodium). Canola, safflower, olive, avocado, soybean, and sunflower oils.  Avocado. Seasonings and condiments Herbs. Spices. Seasoning mixes without salt. Other foods Unsalted popcorn and pretzels. Fat-free sweets. The items listed above may not be all the foods and drinks you can have. Talk to a dietitian to learn more. What foods should I avoid? Fruits Canned fruit in a light or heavy syrup. Fried fruit. Fruit in cream or butter sauce. Vegetables Creamed or fried vegetables. Vegetables in a cheese sauce.  Regular canned vegetables that are not marked as low-sodium or reduced-sodium. Regular canned tomato sauce and paste that are not marked as low-sodium or reduced-sodium. Regular tomato and vegetable juices that are not marked as low-sodium or reduced-sodium. Rosita Fire. Olives. Grains Baked goods made with fat, such as croissants, muffins, or some breads. Dry pasta or rice meal packs. Meats and other proteins Fatty cuts of meat. Ribs. Fried meat. Tomasa Blase. Bologna, salami, and other precooked or cured meats, such as sausages or meat loaves, that are not lean and low in sodium. Fat from the back of a pig (fatback). Bratwurst. Salted nuts and seeds. Canned beans with added salt. Canned or smoked fish. Whole eggs or egg yolks. Chicken or [place] with skin. Dairy Whole or 2% milk, cream, and half-and-half. Whole or full-fat cream cheese. Whole-fat or sweetened yogurt. Full-fat cheese. Nondairy creamers. Whipped toppings. Processed cheese and cheese spreads. Fats and oils Butter. Stick margarine. Lard. Shortening. Ghee. Bacon fat. Tropical oils, such as coconut, palm kernel, or palm oil. Seasonings and condiments Onion salt, garlic salt, seasoned salt, table salt, and sea salt. Worcestershire sauce. Tartar sauce. Barbecue sauce. Teriyaki sauce. Soy sauce, including reduced-sodium soy sauce. Steak sauce. Canned and packaged gravies. Fish sauce. Oyster sauce. Cocktail sauce. Store-bought horseradish. Ketchup. Mustard. Meat flavorings and tenderizers. Bouillon cubes. Hot sauces.  Pre-made or packaged marinades. Pre-made or packaged taco seasonings. Relishes. Regular salad dressings. Other foods Salted popcorn and pretzels. The items listed above may not be all the foods and drinks you should avoid. Talk to a dietitian to learn more. Where to find more information National Heart, Lung, and Blood Institute (NHLBI): BuffaloDryCleaner.gl American Heart Association (AHA): heart.org Academy of Nutrition and Dietetics: eatright.org National Kidney Foundation (NKF): kidney.org This information is not intended to replace advice given to you by your health care provider. Make sure you discuss any questions you have with your health care provider. Document Revised: 10/14/2022 Document Reviewed: 10/14/2022 Elsevier Patient Education  2024 [employer].

## 2023-06-16 NOTE — Progress Notes (Signed)
New Patient Office Visit  Subjective    Patient ID: Lisa Howell, female    DOB: 07-01-1971  Age: 52 y.o. MRN: [id number]  CC:  Chief Complaint  Patient presents with   Follow-up    HPI Lisa Howell is a 52 y/o female with history of Type 2 Diabetes and hyperlipidemia who presents for follow up   Outpatient Encounter Medications as of 06/16/2023  Medication Sig   albuterol (VENTOLIN HFA) 108 (90 Base) MCG/ACT inhaler Inhale 1-2 puffs into the lungs every 6 (six) hours as needed for wheezing or shortness of breath.   blood glucose meter kit and supplies KIT Dispense based on patient and insurance preference. Use up to four times daily as directed. (FOR ICD-9 250.00, 250.01).   fluticasone (FLONASE) 50 MCG/ACT nasal spray Place 2 sprays into both nostrils daily.   glucose blood (RIGHTEST GS550 BLOOD GLUCOSE) test strip USE AS DIRECTED TWICE DAILY.   ibuprofen (ADVIL) 800 MG tablet Take 1 tablet (800 mg total) by mouth daily as needed.   metFORMIN (GLUCOPHAGE-XR) 500 MG 24 hr tablet Take 2 tablets (1,000 mg total) by mouth daily with breakfast.   pravastatin (PRAVACHOL) 20 MG tablet Take 1 tablet (20 mg total) by mouth daily.   Rightest GL300 Lancets MISC USE AS DIRECTED TWICE DAILY.   sodium chloride (OCEAN) 0.65 % SOLN nasal spray Place 1 spray into both nostrils as needed for congestion.   Vitamin D, Ergocalciferol, (DRISDOL) 1.25 MG (50000 UNIT) CAPS capsule Take 1 capsule (50,000 Units total) by mouth every 7 (seven) days.   [DISCONTINUED] benzonatate (TESSALON PERLES) 100 MG capsule Take 1 capsule (100 mg total) by mouth 3 (three) times daily as needed.   [DISCONTINUED] guaiFENesin (ROBITUSSIN) 100 MG/5ML liquid Take 5 mLs (100 mg total) by mouth every 4 (four) hours as needed for cough or to loosen phlegm.   No facility-administered encounter medications on file as of 06/16/2023.    Past Medical History:  Diagnosis Date   Diabetes mellitus  without complication (HCC)    Hyperlipidemia     Past Surgical History:  Procedure Laterality Date   ABDOMINAL HYSTERECTOMY     CESAREAN SECTION     CHOLECYSTECTOMY     COLONOSCOPY WITH PROPOFOL N/A 12/30/2020   Procedure: COLONOSCOPY WITH PROPOFOL;  Surgeon: Midge Minium, MD;  Location: [facility] ENDOSCOPY;  Service: Endoscopy;  Laterality: N/A;   HERNIA REPAIR  1998   c-section site    Family History  Problem Relation Age of Onset   Diabetes Mother    Heart disease Mother    Cancer Mother        "bone"   Diabetes Father    Cancer Father        colon   Diabetes Sister    Diabetes Brother    Heart disease Maternal Grandmother    Heart disease Maternal Grandfather    Dementia Paternal Grandmother    Alzheimer's disease Paternal Grandfather    Diabetes Half-Sister     Social History   Socioeconomic History   Marital status: Divorced    Spouse name: Not on file   Number of children: Not on file   Years of education: Not on file   Highest education level: Not on file  Occupational History   Not on file  Tobacco Use   Smoking status: Former    Current packs/day: 0.00    Types: Cigarettes    Start date: 12/30/1997    Quit date:  12/31/1999    Years since quitting: 23.4   Smokeless tobacco: Never  Vaping Use   Vaping status: Never Used  Substance and Sexual Activity   Alcohol use: Yes    Comment: rarely, maybe 1 drink a year   Drug use: No   Sexual activity: Not on file  Other Topics Concern   Not on file  Social History Narrative   Not on file   Social Determinants of Health   Financial Resource Strain: High Risk (06/16/2023)   Overall Financial Resource Strain (CARDIA)    Difficulty of Paying Living Expenses: Very hard  Food Insecurity: Food Insecurity Present (06/02/2023)   Hunger Vital Sign    Worried About Running Out of Food in the Last Year: Sometimes true    Ran Out of Food in the Last Year: Sometimes true  Transportation Needs: No Transportation Needs  (06/02/2023)   PRAPARE - Administrator, Civil Service (Medical): No    Lack of Transportation (Non-Medical): No  Physical Activity: Inactive (06/16/2023)   Exercise Vital Sign    Days of Exercise per Week: 0 days    Minutes of Exercise per Session: 0 min  Stress: Stress Concern Present (06/16/2023)   [employer] of Occupational Health - Occupational Stress Questionnaire    Feeling of Stress : Very much  Social Connections: Moderately Isolated (06/02/2023)   Social Connection and Isolation Panel [NHANES]    Frequency of Communication with Friends and Family: More than three times a week    Frequency of Social Gatherings with Friends and Family: More than three times a week    Attends Religious Services: More than 4 times per year    Active Member of [employer] or Organizations: No    Attends Banker Meetings: Never    Marital Status: Divorced  Catering manager Violence: Not At Risk (06/02/2023)   Humiliation, Afraid, Rape, and Kick questionnaire    Fear of Current or Ex-Partner: No    Emotionally Abused: No    Physically Abused: No    Sexually Abused: No    ROS      Objective    There were no vitals taken for this visit.  Physical Exam      Assessment & Plan:   Problem List Items Addressed This Visit   None   No follow-ups on file.   Mickie Hillier, FNP

## 2023-06-16 NOTE — Progress Notes (Signed)
Established Patient Office Visit  Subjective   Patient ID: Lisa Howell, female    DOB: [birth year]  Age: 52 y.o. MRN: [id number]  Chief Complaint  Patient presents with   Follow-up    HPI  Lisa Howell is a 52 y/o female with history of Type 2 Diabetes and hyperlipidemia who presents to the clinic for routine check up. Her HgbA1c checked during visit increased from 6.9% to 7.2% and her blood glucose was 140 mg/dl. She states that she's compliant with her medications, denies side effects and continues to make healthy lifestyle changes. She denies hypo/hyperglycemic symptoms, peripheral neuropathy and performs daily foot check. She states that she does not check her blood sugar daily because she is currently moving and does not adhere to ADA diet. Her monofilament test done during visit was normal. Overall , she states that she is doing well and offers no further complaint.    Patient Active Problem List   Diagnosis Date Noted   Sinus pressure 06/02/2023   Thumb pain, left 03/17/2023   Skin lesion 02/03/2023   Unspecified chronic bronchitis (HCC) 11/11/2022   Nasal congestion 10/13/2022   Cough 09/15/2022   Wheezing 09/15/2022   Health care maintenance 03/17/2022   Encounter for screening colonoscopy    Polyp of transverse colon    Obesity 12/17/2020   Dental decay 09/11/2020   Elevated lipids 08/13/2020   Fluttering sensation of heart 08/13/2020   Encounter to establish care 07/10/2020   Type 2 diabetes mellitus without complications (HCC) 07/10/2020   Hospital discharge follow-up 07/10/2020   Pneumonia due to 2019-nCoV 05/27/2020   Chronic tension-type headache, not intractable 09/18/2015   Headache 02/25/2015   Past Medical History:  Diagnosis Date   Diabetes mellitus without complication (HCC)    Hyperlipidemia    Past Surgical History:  Procedure Laterality Date   ABDOMINAL HYSTERECTOMY     CESAREAN SECTION     CHOLECYSTECTOMY     COLONOSCOPY WITH  PROPOFOL N/A 12/30/2020   Procedure: COLONOSCOPY WITH PROPOFOL;  Surgeon: Midge Minium, MD;  Location: [facility] ENDOSCOPY;  Service: Endoscopy;  Laterality: N/A;   HERNIA REPAIR  1998   c-section site   Social History   Tobacco Use   Smoking status: Former    Current packs/day: 0.00    Types: Cigarettes    Start date: 12/30/1997    Quit date: 12/31/1999    Years since quitting: 23.4   Smokeless tobacco: Never  Vaping Use   Vaping status: Never Used  Substance Use Topics   Alcohol use: Yes    Comment: rarely, maybe 1 drink a year   Drug use: No   Social History   Socioeconomic History   Marital status: Divorced    Spouse name: Not on file   Number of children: Not on file   Years of education: Not on file   Highest education level: Not on file  Occupational History   Not on file  Tobacco Use   Smoking status: Former    Current packs/day: 0.00    Types: Cigarettes    Start date: 12/30/1997    Quit date: 12/31/1999    Years since quitting: 23.4   Smokeless tobacco: Never  Vaping Use   Vaping status: Never Used  Substance and Sexual Activity   Alcohol use: Yes    Comment: rarely, maybe 1 drink a year   Drug use: No   Sexual activity: Not on file  Other Topics Concern   Not on file  Social History Narrative   Not on file   Social Determinants of Health   Financial Resource Strain: High Risk (06/16/2023)   Overall Financial Resource Strain (CARDIA)    Difficulty of Paying Living Expenses: Very hard  Food Insecurity: Food Insecurity Present (06/02/2023)   Hunger Vital Sign    Worried About Running Out of Food in the Last Year: Sometimes true    Ran Out of Food in the Last Year: Sometimes true  Transportation Needs: No Transportation Needs (06/02/2023)   PRAPARE - Administrator, Civil Service (Medical): No    Lack of Transportation (Non-Medical): No  Physical Activity: Inactive (06/16/2023)   Exercise Vital Sign    Days of Exercise per Week: 0 days    Minutes  of Exercise per Session: 0 min  Stress: Stress Concern Present (06/16/2023)   [employer] of Occupational Health - Occupational Stress Questionnaire    Feeling of Stress : Very much  Social Connections: Moderately Isolated (06/02/2023)   Social Connection and Isolation Panel [NHANES]    Frequency of Communication with Friends and Family: More than three times a week    Frequency of Social Gatherings with Friends and Family: More than three times a week    Attends Religious Services: More than 4 times per year    Active Member of [employer] or Organizations: No    Attends Banker Meetings: Never    Marital Status: Divorced  Catering manager Violence: Not At Risk (06/02/2023)   Humiliation, Afraid, Rape, and Kick questionnaire    Fear of Current or Ex-Partner: No    Emotionally Abused: No    Physically Abused: No    Sexually Abused: No   Family Status  Relation Name Status   Mother  Deceased   Father  Alive   Sister  Alive   Brother  Alive   MGM  Deceased   MGF  Deceased   PGM  Deceased   PGF  Deceased   H Sister  Alive  No partnership data on file   Family History  Problem Relation Age of Onset   Diabetes Mother    Heart disease Mother    Cancer Mother        "bone"   Diabetes Father    Cancer Father        colon   Diabetes Sister    Diabetes Brother    Heart disease Maternal Grandmother    Heart disease Maternal Grandfather    Dementia Paternal Grandmother    Alzheimer's disease Paternal Grandfather    Diabetes Half-Sister    Allergies  Allergen Reactions   Onion Anaphylaxis   Aspirin Other (See Comments)    Drowsy  Drowsy  Drowsy       Review of Systems  Constitutional: Negative.   HENT: Negative.    Eyes: Negative.   Respiratory: Negative.    Cardiovascular: Negative.   Gastrointestinal: Negative.   Genitourinary: Negative.   Musculoskeletal: Negative.   Skin: Negative.   Endo/Heme/Allergies: Negative.   Psychiatric/Behavioral:  Negative.        Objective:     BP 130/86   Pulse 88   Ht 5\' 1"  (1.549 m)   Wt 231 lb (104.8 kg)   SpO2 93%   BMI 43.65 kg/m  BP Readings from Last 3 Encounters:  06/16/23 130/86  06/02/23 124/84  05/17/23 98/64   Wt Readings from Last 3 Encounters:  06/16/23 231 lb (104.8 kg)  06/02/23 226 lb 14.4 oz (102.9  kg)  05/17/23 230 lb (104.3 kg)      Physical Exam Constitutional:      Appearance: Normal appearance.  HENT:     Head: Normocephalic.     Nose: Nose normal.     Mouth/Throat:     Mouth: Mucous membranes are moist.  Cardiovascular:     Rate and Rhythm: Normal rate and regular rhythm.  Pulmonary:     Effort: Pulmonary effort is normal.     Breath sounds: Normal breath sounds.  Musculoskeletal:        General: Normal range of motion.     Cervical back: Normal range of motion.  Skin:    General: Skin is warm.  Neurological:     General: No focal deficit present.     Mental Status: She is alert and oriented to person, place, and time.  Psychiatric:        Mood and Affect: Mood normal.        Behavior: Behavior normal.        Thought Content: Thought content normal.        Judgment: Judgment normal.      Results for orders placed or performed in visit on 06/16/23  POCT HgB A1C  Result Value Ref Range   Hemoglobin A1C 7.2 (A) 4.0 - 5.6 %   HbA1c POC (<> result, manual entry)     HbA1c, POC (prediabetic range)     HbA1c, POC (controlled diabetic range)    POCT Glucose (CBG)  Result Value Ref Range   POC Glucose 140 (A) 70 - 99 mg/dl    Last CBC Lab Results  Component Value Date   WBC 8.5 12/15/2022   HGB 11.0 (L) 12/15/2022   HCT 34.3 12/15/2022   MCV 79 12/15/2022   MCH 25.3 (L) 12/15/2022   RDW 14.7 12/15/2022   PLT 370 12/15/2022   Last metabolic panel Lab Results  Component Value Date   GLUCOSE 115 (H) 12/15/2022   NA 141 12/15/2022   K 4.5 12/15/2022   CL 103 12/15/2022   CO2 24 12/15/2022   BUN 10 12/15/2022   CREATININE 0.70  12/15/2022   EGFR 103 12/15/2022   CALCIUM 9.2 12/15/2022   PROT 6.9 12/15/2022   ALBUMIN 3.8 12/15/2022   LABGLOB 3.1 12/15/2022   AGRATIO 1.2 12/15/2022   BILITOT 0.4 12/15/2022   ALKPHOS 112 12/15/2022   AST 13 12/15/2022   ALT 10 12/15/2022   ANIONGAP 12 05/31/2020   Last lipids Lab Results  Component Value Date   CHOL 170 12/15/2022   HDL 41 12/15/2022   LDLCALC 103 (H) 12/15/2022   TRIG 147 12/15/2022   CHOLHDL 4.1 12/15/2022   Last hemoglobin A1c Lab Results  Component Value Date   HGBA1C 7.2 (A) 06/16/2023   Last thyroid functions Lab Results  Component Value Date   TSH 0.931 12/09/2021   Last vitamin D Lab Results  Component Value Date   VD25OH 7.4 (L) 03/23/2023   Last vitamin B12 and Folate No results found for: "VITAMINB12", "FOLATE"    The 10-year ASCVD risk score (Arnett DK, et al., 2019) is: 4%    Assessment & Plan:  1. Type 2 diabetes mellitus without complication, without long-term current use of insulin (HCC) -Her diabetes is improving, though her HgbA1c was 7.2%, her goal should be less than 7%. she will continue on current medication, advised to continue on low carb/non concentrated sweet diet and exercise as tolerated. - POCT Glucose (CBG) - POCT HgB  A1C - metFORMIN (GLUCOPHAGE-XR) 500 MG 24 hr tablet; Take 2 tablets (1,000 mg total) by mouth daily with breakfast.  Dispense: 180 tablet; Refill: 0  2. Elevated lipids - She will continue on current medication,low fat/cholesterol diet and exercise as tolerated.  - pravastatin (PRAVACHOL) 20 MG tablet; Take 1 tablet (20 mg total) by mouth daily.  Dispense: 90 tablet; Refill: 0  3. Vitamin D deficiency - She completed her Ergocalciferol and will recheck Vit D. Vitamin D (25 hydroxy); - Vitamin D (25 hydroxy)     Return in about 3 months (around 09/15/2023), or if symptoms worsen or fail to improve.    Lutricia Horsfall, RN

## 2023-06-17 LAB — VITAMIN D 25 HYDROXY (VIT D DEFICIENCY, FRACTURES): Vit D, 25-Hydroxy: 30.3 ng/mL (ref 30.0–100.0)

## 2023-06-21 ENCOUNTER — Other Ambulatory Visit: Payer: Self-pay

## 2023-06-27 ENCOUNTER — Other Ambulatory Visit: Payer: Self-pay

## 2023-07-14 ENCOUNTER — Ambulatory Visit: Payer: Self-pay | Admitting: Gerontology

## 2023-07-14 VITALS — BP 125/81 | HR 99 | Temp 98.0°F | Wt 231.8 lb

## 2023-07-14 DIAGNOSIS — R0981 Nasal congestion: Secondary | ICD-10-CM

## 2023-07-14 DIAGNOSIS — J3489 Other specified disorders of nose and nasal sinuses: Secondary | ICD-10-CM

## 2023-07-14 MED ORDER — SALINE SPRAY 0.65 % NA SOLN
1.0000 | NASAL | 0 refills | Status: DC | PRN
  Filled 2023-07-14: qty 30, 30d supply, fill #0
  Filled 2023-07-15: qty 44, 30d supply, fill #0

## 2023-07-14 MED ORDER — FLUTICASONE PROPIONATE 50 MCG/ACT NA SUSP
2.0000 | Freq: Every day | NASAL | 0 refills | Status: DC
Start: 2023-07-14 — End: 2023-12-13
  Filled 2023-07-14: qty 16, 30d supply, fill #0

## 2023-07-14 MED ORDER — ALBUTEROL SULFATE HFA 108 (90 BASE) MCG/ACT IN AERS
1.0000 | INHALATION_SPRAY | Freq: Four times a day (QID) | RESPIRATORY_TRACT | 0 refills | Status: DC | PRN
Start: 2023-07-14 — End: 2023-12-13
  Filled 2023-07-14: qty 6.7, 25d supply, fill #0

## 2023-07-14 NOTE — Patient Instructions (Signed)
Diabetes Mellitus Basics  Diabetes mellitus, or diabetes, is a long-term (chronic) disease. It occurs when the body does not properly use sugar (glucose) that is released from food after you eat. Diabetes mellitus may be caused by one or both of these problems: Your pancreas does not make enough of a hormone called insulin. Your body does not react in a normal way to the insulin that it makes. Insulin lets glucose enter cells in your body. This gives you energy. If you have diabetes, glucose cannot get into cells. This causes high blood glucose (hyperglycemia). How to treat and manage diabetes You may need to take insulin or other diabetes medicines daily to keep your glucose in balance. If you are prescribed insulin, you will learn how to give yourself insulin by injection. You may need to adjust the amount of insulin you take based on the foods that you eat. You will need to check your blood glucose levels using a glucose monitor as told by your health care provider. The readings can help determine if you have low or high blood glucose. Generally, you should have these blood glucose levels: Before meals (preprandial): 80-130 mg/dL (4.4-7.2 mmol/L). After meals (postprandial): below 180 mg/dL (10 mmol/L). Hemoglobin A1c (HbA1c) level: less than 7%. Your health care provider will set treatment goals for you. Keep all follow-up visits. This is important. Follow these instructions at home: Diabetes medicines Take your diabetes medicines every day as told by your health care provider. List your diabetes medicines here: Name of medicine: ______________________________ Amount (dose): _______________ Time (a.m./p.m.): _______________ Notes: ___________________________________ Name of medicine: ______________________________ Amount (dose): _______________ Time (a.m./p.m.): _______________ Notes: ___________________________________ Name of medicine: ______________________________ Amount (dose):  _______________ Time (a.m./p.m.): _______________ Notes: ___________________________________ Insulin If you use insulin, list the types of insulin you use here: Insulin type: ______________________________ Amount (dose): _______________ Time (a.m./p.m.): _______________Notes: ___________________________________ Insulin type: ______________________________ Amount (dose): _______________ Time (a.m./p.m.): _______________ Notes: ___________________________________ Insulin type: ______________________________ Amount (dose): _______________ Time (a.m./p.m.): _______________ Notes: ___________________________________ Insulin type: ______________________________ Amount (dose): _______________ Time (a.m./p.m.): _______________ Notes: ___________________________________ Insulin type: ______________________________ Amount (dose): _______________ Time (a.m./p.m.): _______________ Notes: ___________________________________ Managing blood glucose  Check your blood glucose levels using a glucose monitor as told by your health care provider. Write down the times that you check your glucose levels here: Time: _______________ Notes: ___________________________________ Time: _______________ Notes: ___________________________________ Time: _______________ Notes: ___________________________________ Time: _______________ Notes: ___________________________________ Time: _______________ Notes: ___________________________________ Time: _______________ Notes: ___________________________________  Low blood glucose Low blood glucose (hypoglycemia) is when glucose is at or below 70 mg/dL (3.9 mmol/L). Symptoms may include: Feeling: Hungry. Sweaty and clammy. Irritable or easily upset. Dizzy. Sleepy. Having: A fast heartbeat. A headache. A change in your vision. Numbness around the mouth, lips, or tongue. Having trouble with: Moving (coordination). Sleeping. Treating low blood glucose To treat low blood  glucose, eat or drink something containing sugar right away. If you can think clearly and swallow safely, follow the 15:15 rule: Take 15 grams of a fast-acting carb (carbohydrate), as told by your health care provider. Some fast-acting carbs are: Glucose tablets: take 3-4 tablets. Hard candy: eat 3-5 pieces. Fruit juice: drink 4 oz (120 mL). Regular (not diet) soda: drink 4-6 oz (120-180 mL). Honey or sugar: eat 1 Tbsp (15 mL). Check your blood glucose levels 15 minutes after you take the carb. If your glucose is still at or below 70 mg/dL (3.9 mmol/L), take 15 grams of a carb again. If your glucose does not go above 70 mg/dL (3.9 mmol/L) after   3 tries, get help right away. After your glucose goes back to normal, eat a meal or a snack within 1 hour. Treating very low blood glucose If your glucose is at or below 54 mg/dL (3 mmol/L), you have very low blood glucose (severe hypoglycemia). This is an emergency. Do not wait to see if the symptoms will go away. Get medical help right away. Call your local emergency services (911 in the [place] Do not drive yourself to the hospital. Questions to ask your health care provider Should I talk with a diabetes educator? What equipment will I need to care for myself at home? What diabetes medicines do I need? When should I take them? How often do I need to check my blood glucose levels? What number can I call if I have questions? When is my follow-up visit? Where can I find a support group for people with diabetes? Where to find more information American Diabetes Association: www.diabetes.org Association of Diabetes Care and Education Specialists: www.diabeteseducator.org Contact a health care provider if: Your blood glucose is at or above 240 mg/dL (13.3 mmol/L) for 2 days in a row. You have been sick or have had a fever for 2 days or more, and you are not getting better. You have any of these problems for more than 6 hours: You cannot eat or  drink. You feel nauseous. You vomit. You have diarrhea. Get help right away if: Your blood glucose is lower than 54 mg/dL (3 mmol/L). You get confused. You have trouble thinking clearly. You have trouble breathing. These symptoms may represent a serious problem that is an emergency. Do not wait to see if the symptoms will go away. Get medical help right away. Call your local emergency services (911 in the [place] Do not drive yourself to the hospital. Summary Diabetes mellitus is a chronic disease that occurs when the body does not properly use sugar (glucose) that is released from food after you eat. Take insulin and diabetes medicines as told. Check your blood glucose every day, as often as told. Keep all follow-up visits. This is important. This information is not intended to replace advice given to you by your health care provider. Make sure you discuss any questions you have with your health care provider. Document Revised: 01/29/2020 Document Reviewed: 01/29/2020 Elsevier Patient Education  2024 [employer]  

## 2023-07-14 NOTE — Progress Notes (Signed)
Established Patient Office Visit  Subjective   Patient ID: Lisa Howell, female    DOB: Feb 16, 1971  Age: 52 y.o. MRN: [id number]  No chief complaint on file.   HPI  ANGELICE BUA is a 52 y/o female with history of Type 2 Diabetes and hyperlipidemia who presents to the clinic with difficulty breathing, chest tightness, rhinitis (clear mucus), maxillary sinus tenderness, mild cough, and post nasal drip that began on 07/08/2023. She denies fever, chillis,decrease appetite, wheezing, nausea, or vomiting. She is taking Ibuprofen,Albuterol, flonase, nasal spray, mucinex, tylenol sinus, and using a cold compress over her maxillary sinus. She states that her symptoms are improving. She states that she took at home COVID-19 test on 07/10/2023 which was negative. Today her blood glucose was 223 mg/dL, she had a Big Mac from [employer] right before arrival. She states that her blood glucose is typically 150's. Overall she is doing well and offers no further complaints at this time.   Review of Systems  Constitutional: Negative.   HENT:  Positive for congestion.        Rhinitis  Sneezing   Eyes: Negative.   Respiratory:         Dry cough, difficulty breathing   Cardiovascular: Negative.   Gastrointestinal: Negative.   Genitourinary: Negative.   Musculoskeletal: Negative.   Skin: Negative.   Neurological: Negative.   Psychiatric/Behavioral: Negative.        Objective:     BP 125/81 (BP Location: Right Arm, Patient Position: Sitting, Cuff Size: Large)   Pulse 99   Temp 98 F (36.7 C)   Wt 231 lb 12.8 oz (105.1 kg)   BMI 43.80 kg/m  BP Readings from Last 3 Encounters:  07/14/23 125/81  06/16/23 130/86  06/02/23 124/84   Wt Readings from Last 3 Encounters:  07/14/23 231 lb 12.8 oz (105.1 kg)  06/16/23 231 lb (104.8 kg)  06/02/23 226 lb 14.4 oz (102.9 kg)      Physical Exam Constitutional:      Appearance: Normal appearance.  HENT:     Head: Normocephalic.       Comments: Maxillary tenderness on palpation.     Mouth/Throat:     Mouth: Mucous membranes are moist.     Pharynx: Oropharynx is clear.  Eyes:     Extraocular Movements: Extraocular movements intact.     Pupils: Pupils are equal, round, and reactive to light.  Cardiovascular:     Rate and Rhythm: Normal rate and regular rhythm.     Pulses: Normal pulses.     Heart sounds: Normal heart sounds.  Pulmonary:     Effort: Pulmonary effort is normal.     Breath sounds: Normal breath sounds.  Abdominal:     General: Bowel sounds are normal.     Palpations: Abdomen is soft.  Musculoskeletal:     Cervical back: Normal range of motion.  Skin:    General: Skin is warm and dry.  Neurological:     General: No focal deficit present.     Mental Status: She is alert and oriented to person, place, and time. Mental status is at baseline.  Psychiatric:        Mood and Affect: Mood normal.        Behavior: Behavior normal.        Thought Content: Thought content normal.        Judgment: Judgment normal.      No results found for any visits on 07/14/23.  Last CBC  Lab Results  Component Value Date   WBC 8.5 12/15/2022   HGB 11.0 (L) 12/15/2022   HCT 34.3 12/15/2022   MCV 79 12/15/2022   MCH 25.3 (L) 12/15/2022   RDW 14.7 12/15/2022   PLT 370 12/15/2022   Last metabolic panel Lab Results  Component Value Date   GLUCOSE 115 (H) 12/15/2022   NA 141 12/15/2022   K 4.5 12/15/2022   CL 103 12/15/2022   CO2 24 12/15/2022   BUN 10 12/15/2022   CREATININE 0.70 12/15/2022   EGFR 103 12/15/2022   CALCIUM 9.2 12/15/2022   PROT 6.9 12/15/2022   ALBUMIN 3.8 12/15/2022   LABGLOB 3.1 12/15/2022   AGRATIO 1.2 12/15/2022   BILITOT 0.4 12/15/2022   ALKPHOS 112 12/15/2022   AST 13 12/15/2022   ALT 10 12/15/2022   ANIONGAP 12 05/31/2020   Last lipids Lab Results  Component Value Date   CHOL 170 12/15/2022   HDL 41 12/15/2022   LDLCALC 103 (H) 12/15/2022   TRIG 147 12/15/2022   CHOLHDL  4.1 12/15/2022   Last hemoglobin A1c Lab Results  Component Value Date   HGBA1C 7.2 (A) 06/16/2023   Last thyroid functions Lab Results  Component Value Date   TSH 0.931 12/09/2021   Last vitamin D Lab Results  Component Value Date   VD25OH 30.3 06/16/2023     The 10-year ASCVD risk score (Arnett DK, et al., 2019) is: 3.7%    Assessment & Plan:  1. Nasal congestion - Her nasal congestion is improving . She will continue on medication regimen. She was encouraged to increase her water intake and get adequate rest.  She was advised to notify clinic and go to the ED for worsening symptoms. - fluticasone (FLONASE) 50 MCG/ACT nasal spray; Place 2 sprays into both nostrils daily.  Dispense: 16 g; Refill: 0 - albuterol (VENTOLIN HFA) 108 (90 Base) MCG/ACT inhaler; Inhale 1-2 puffs into the lungs every 6 (six) hours as needed for wheezing or shortness of breath.  Dispense: 6.7 g; Refill: 0  2. Sinus pressure - Mild maxillary sinus pressure during palpation. She was advised to continue on medication as prescribed, use a warm compress, and OTC ibuprofen as needed.She was also encouraged to drink plenty of water.She was advised to notify clinic and go to the ED for worsening symptoms.  - sodium chloride (OCEAN) 0.65 % SOLN nasal spray; Place 1 spray into both nostrils as needed for congestion.  Dispense: 30 mL; Refill: 0     Return if symptoms worsen or fail to improve. F/U appointment 09/15/2023.   Chioma Trellis Paganini, NP, FNP student

## 2023-07-15 ENCOUNTER — Other Ambulatory Visit: Payer: Self-pay

## 2023-07-18 ENCOUNTER — Other Ambulatory Visit: Payer: Self-pay

## 2023-09-15 ENCOUNTER — Other Ambulatory Visit: Payer: Self-pay

## 2023-09-15 ENCOUNTER — Ambulatory Visit: Payer: Self-pay | Admitting: Gerontology

## 2023-09-15 ENCOUNTER — Encounter: Payer: Self-pay | Admitting: Gerontology

## 2023-09-15 VITALS — BP 125/89 | HR 79 | Temp 97.9°F | Ht 61.0 in | Wt 234.1 lb

## 2023-09-15 DIAGNOSIS — E785 Hyperlipidemia, unspecified: Secondary | ICD-10-CM

## 2023-09-15 DIAGNOSIS — H6123 Impacted cerumen, bilateral: Secondary | ICD-10-CM

## 2023-09-15 DIAGNOSIS — E119 Type 2 diabetes mellitus without complications: Secondary | ICD-10-CM

## 2023-09-15 LAB — GLUCOSE, POCT (MANUAL RESULT ENTRY): POC Glucose: 143 mg/dL — AB (ref 70–99)

## 2023-09-15 LAB — POCT GLYCOSYLATED HEMOGLOBIN (HGB A1C): Hemoglobin A1C: 7.3 % — AB (ref 4.0–5.6)

## 2023-09-15 MED ORDER — METFORMIN HCL ER 500 MG PO TB24
1000.0000 mg | ORAL_TABLET | Freq: Every day | ORAL | 0 refills | Status: DC
  Filled 2023-09-15: qty 180, 90d supply, fill #0

## 2023-09-15 MED ORDER — PRAVASTATIN SODIUM 20 MG PO TABS
20.0000 mg | ORAL_TABLET | Freq: Every day | ORAL | 0 refills | Status: DC
  Filled 2023-09-15 – 2023-09-26 (×2): qty 90, 90d supply, fill #0

## 2023-09-15 MED ORDER — DEBROX 6.5 % OT SOLN
5.0000 [drp] | Freq: Two times a day (BID) | OTIC | 0 refills | Status: DC
Start: 2023-09-15 — End: 2023-12-13
  Filled 2023-09-15: qty 15, 30d supply, fill #0

## 2023-09-15 NOTE — Patient Instructions (Signed)

## 2023-09-15 NOTE — Progress Notes (Signed)
Follow up  Office Visit  Subjective   Patient ID: Lisa Howell, female    DOB: 1971-09-04  Age: 52 y.o. MRN: [id number]  No chief complaint on file.   HPI  Lisa Howell is a 52 y/o female with history of Type 2 Diabetes and hyperlipidemia who presents to the clinic with dizziness. She states that when she woke up this morning, she feels dizziness , nausea, and the  room was spinning. She slide down from the bed. She denies injury, hitting her head, arms and legs. She denies headache, chest pain, SOB at that time. She didn't drink fluid much due to nausea. At today's visit, her glucose was 143 mg/dl and Hgb N8G 7.3 %. She states she checks glucose twice a day and PRN at home, reading 110-140 mg/dL. She denies hypo/hyper glycemia symptoms. Overall she is doing well and offers no further complaint at this time.  Review of Systems  Constitutional:        Feels dizziness when changes position.  Eyes: Negative.   Respiratory: Negative.    Cardiovascular: Negative.   Gastrointestinal: Negative.   Genitourinary: Negative.   Musculoskeletal: Negative.   Skin: Negative.   Neurological:  Positive for dizziness.  Endo/Heme/Allergies: Negative.   Psychiatric/Behavioral: Negative.       Objective:     There were no vitals taken for this visit. BP Readings from Last 3 Encounters:  09/15/23 125/89  07/14/23 125/81  06/16/23 130/86   Wt Readings from Last 3 Encounters:  09/15/23 234 lb 1.6 oz (106.2 kg)  07/14/23 231 lb 12.8 oz (105.1 kg)  06/16/23 231 lb (104.8 kg)      Physical Exam Constitutional:      Appearance: Normal appearance.  HENT:     Head: Normocephalic.     Right Ear: Tympanic membrane normal. There is no impacted cerumen.     Left Ear: Tympanic membrane normal. There is impacted cerumen.     Nose: Nose normal.     Mouth/Throat:     Mouth: Mucous membranes are moist.  Eyes:     Extraocular Movements: Extraocular movements intact.     Conjunctiva/sclera:  Conjunctivae normal.     Pupils: Pupils are equal, round, and reactive to light.  Cardiovascular:     Rate and Rhythm: Normal rate and regular rhythm.     Pulses: Normal pulses.     Heart sounds: Normal heart sounds.  Pulmonary:     Effort: Pulmonary effort is normal.     Breath sounds: Normal breath sounds.  Abdominal:     General: Bowel sounds are normal.     Palpations: Abdomen is soft.  Musculoskeletal:        General: Normal range of motion.     Cervical back: Normal range of motion and neck supple.  Skin:    General: Skin is warm.  Neurological:     General: No focal deficit present.     Mental Status: She is alert and oriented to person, place, and time.  Psychiatric:        Mood and Affect: Mood normal.        Behavior: Behavior normal.    No results found for any visits on 09/15/23.  Last CBC Lab Results  Component Value Date   WBC 8.5 12/15/2022   HGB 11.0 (L) 12/15/2022   HCT 34.3 12/15/2022   MCV 79 12/15/2022   MCH 25.3 (L) 12/15/2022   RDW 14.7 12/15/2022   PLT 370 12/15/2022  Last metabolic panel Lab Results  Component Value Date   GLUCOSE 115 (H) 12/15/2022   NA 141 12/15/2022   K 4.5 12/15/2022   CL 103 12/15/2022   CO2 24 12/15/2022   BUN 10 12/15/2022   CREATININE 0.70 12/15/2022   EGFR 103 12/15/2022   CALCIUM 9.2 12/15/2022   PROT 6.9 12/15/2022   ALBUMIN 3.8 12/15/2022   LABGLOB 3.1 12/15/2022   AGRATIO 1.2 12/15/2022   BILITOT 0.4 12/15/2022   ALKPHOS 112 12/15/2022   AST 13 12/15/2022   ALT 10 12/15/2022   ANIONGAP 12 05/31/2020   Last lipids Lab Results  Component Value Date   CHOL 170 12/15/2022   HDL 41 12/15/2022   LDLCALC 103 (H) 12/15/2022   TRIG 147 12/15/2022   CHOLHDL 4.1 12/15/2022   Last hemoglobin A1c Lab Results  Component Value Date   HGBA1C 7.3 (A) 09/15/2023   Last thyroid functions Lab Results  Component Value Date   TSH 0.931 12/09/2021   Last vitamin D Lab Results  Component Value Date    VD25OH 30.3 06/16/2023   Last vitamin B12 and Folate No results found for: "VITAMINB12", "FOLATE"   The 10-year ASCVD risk score (Arnett DK, et al., 2019) is: 3.7%    Assessment & Plan:  1. Type 2 diabetes mellitus without complication, without long-term current use of insulin (HCC) - Her diabetes is improving, her HgbA1c was 7.3% and her goal should be less than 7%. She was encouraged to continue on low carb/non concentrated sweat diet and exercise as tolerated. She was encouraged to check her glucose at home and bring the log at the next visit. She will continue on current medication regimen. - POCT Glucose (CBG) - POCT HgB A1C - metFORMIN (GLUCOPHAGE-XR) 500 MG 24 hr tablet; Take 2 tablets (1,000 mg total) by mouth daily with breakfast.  Dispense: 180 tablet; Refill: 0  2. Elevated lipids -The 10-year ASCVD risk score (Arnett DK, et al., 2019) is: 3.7%   Values used to calculate the score:     Age: 27 years     Sex: Female     Is Non-Hispanic African American: No     Diabetic: Yes     Tobacco smoker: No     Systolic Blood Pressure: 125 mmHg     Is BP treated: No     HDL Cholesterol: 41 mg/dL     Total Cholesterol: 170 mg/dL  - She will continue on current medication, was encouraged on low fat/cholesterol diet and exercise as tolerated. She was instructed to cal office or go to ED if having trouble with speaking, seeing, severe headache, chest pain, SOB.  - pravastatin (PRAVACHOL) 20 MG tablet; Take 1 tablet (20 mg total) by mouth daily.  Dispense: 90 tablet; Refill: 0  3. Excessive ear wax, bilateral She was  was started on Debrox, instructed on how to use medication and side of effect of medication - carbamide peroxide (DEBROX) 6.5 % OTIC solution; Place 5 drops into both ears 2 (two) times daily.  Dispense: 15 mL; Refill: 0  4. Dizziness, unspecific  - Unknown etiology of dizziness. It may be related to bilateral excessive cerumen. Her orthostatic Bp without significant  changes. She was advised to change her position slowly, increase water intake, as well as a good rest. She was advised to call office the next week Tuesday or go to ED at any time if symptoms getting worse.  Follow up in 3 months (12/14/2023) in clinic.  Odette Fraction,  NP

## 2023-09-26 ENCOUNTER — Other Ambulatory Visit: Payer: Self-pay

## 2023-12-13 ENCOUNTER — Ambulatory Visit: Payer: Self-pay | Admitting: Gerontology

## 2023-12-13 ENCOUNTER — Other Ambulatory Visit: Payer: Self-pay

## 2023-12-13 ENCOUNTER — Encounter: Payer: Self-pay | Admitting: Gerontology

## 2023-12-13 VITALS — BP 123/82 | HR 92 | Ht 61.0 in | Wt 232.9 lb

## 2023-12-13 DIAGNOSIS — R0981 Nasal congestion: Secondary | ICD-10-CM

## 2023-12-13 DIAGNOSIS — Z0000 Encounter for general adult medical examination without abnormal findings: Secondary | ICD-10-CM

## 2023-12-13 DIAGNOSIS — E785 Hyperlipidemia, unspecified: Secondary | ICD-10-CM

## 2023-12-13 DIAGNOSIS — L308 Other specified dermatitis: Secondary | ICD-10-CM | POA: Insufficient documentation

## 2023-12-13 DIAGNOSIS — E119 Type 2 diabetes mellitus without complications: Secondary | ICD-10-CM

## 2023-12-13 LAB — POCT GLYCOSYLATED HEMOGLOBIN (HGB A1C): Hemoglobin A1C: 6.8 % — AB (ref 4.0–5.6)

## 2023-12-13 LAB — GLUCOSE, POCT (MANUAL RESULT ENTRY): POC Glucose: 115 mg/dL — AB (ref 70–99)

## 2023-12-13 MED ORDER — PRAVASTATIN SODIUM 20 MG PO TABS
20.0000 mg | ORAL_TABLET | Freq: Every day | ORAL | 0 refills | Status: DC
  Filled 2023-12-13: qty 90, 90d supply, fill #0

## 2023-12-13 MED ORDER — METFORMIN HCL ER 500 MG PO TB24
1000.0000 mg | ORAL_TABLET | Freq: Every day | ORAL | 0 refills | Status: DC
  Filled 2023-12-13: qty 180, 90d supply, fill #0

## 2023-12-13 MED ORDER — ALBUTEROL SULFATE HFA 108 (90 BASE) MCG/ACT IN AERS
1.0000 | INHALATION_SPRAY | Freq: Four times a day (QID) | RESPIRATORY_TRACT | 0 refills | Status: AC | PRN
  Filled 2023-12-13: qty 6.7, 25d supply, fill #0

## 2023-12-13 MED ORDER — CLOTRIMAZOLE-BETAMETHASONE 1-0.05 % EX CREA
1.0000 | TOPICAL_CREAM | Freq: Two times a day (BID) | CUTANEOUS | 0 refills | Status: AC
  Filled 2023-12-13: qty 30, 15d supply, fill #0

## 2023-12-13 NOTE — Progress Notes (Signed)
 Established Patient Office Visit  Subjective   Patient ID: Lisa Howell, female    DOB: 05/26/1971  Age: 53 y.o. MRN: [id number]  Chief Complaint  Patient presents with   Follow-up    HPI  Lisa Howell is a 53 y/o female with history of Type 2 Diabetes and hyperlipidemia who presents to the clinic for follow up visit. She states that she's compliant with her medications, denies side effects and continues to make healthy lifestyle changes. Her HgbA1c checked during visit  decreased from 7.3% to 6.8% and her blood glucose was 115 mg/dl.   She  is monitoring her glucose reading twice a day before breakfast and before  dinner. Her fasting blood glucose readings  are between 100-115 mg/dl. She denies hypoglycemic and hyperglycemic  symptoms, peripheral neuropathy and performs daily foot checks. She reported experiencing intermittent chronic rash to abdominal  folds and under her breasts and applying Lotrisone cream relieve symptoms.  She states that rash erupts with sweating, though she does not have any rash currently. Overall, she states that she's doing well and offers no further complaint.       Patient Active Problem List   Diagnosis Date Noted   Sinus pressure 06/02/2023   Thumb pain, left 03/17/2023   Skin lesion 02/03/2023   Unspecified chronic bronchitis (HCC) 11/11/2022   Nasal congestion 10/13/2022   Cough 09/15/2022   Wheezing 09/15/2022   Health care maintenance 03/17/2022   Encounter for screening colonoscopy    Polyp of transverse colon    Obesity 12/17/2020   Dental decay 09/11/2020   Elevated lipids 08/13/2020   Fluttering sensation of heart 08/13/2020   Encounter to establish care 07/10/2020   Type 2 diabetes mellitus without complications (HCC) 07/10/2020   Hospital discharge follow-up 07/10/2020   Pneumonia due to 2019-nCoV 05/27/2020   Chronic tension-type headache, not intractable 09/18/2015   Headache 02/25/2015   Past Medical History:   Diagnosis Date   Diabetes mellitus without complication (HCC)    Hyperlipidemia    Past Surgical History:  Procedure Laterality Date   ABDOMINAL HYSTERECTOMY     CESAREAN SECTION     CHOLECYSTECTOMY     COLONOSCOPY WITH PROPOFOL N/A 12/30/2020   Procedure: COLONOSCOPY WITH PROPOFOL;  Surgeon: Midge Minium, MD;  Location: [facility] ENDOSCOPY;  Service: Endoscopy;  Laterality: N/A;   HERNIA REPAIR  1998   c-section site   Social History   Tobacco Use   Smoking status: Former    Current packs/day: 0.00    Types: Cigarettes    Start date: 12/30/1997    Quit date: 12/31/1999    Years since quitting: 23.9   Smokeless tobacco: Never  Vaping Use   Vaping status: Never Used  Substance Use Topics   Alcohol use: Yes    Comment: rarely, maybe 1 drink a year   Drug use: No   Family History  Problem Relation Age of Onset   Diabetes Mother    Heart disease Mother    Cancer Mother        "bone"   Diabetes Father    Cancer Father        colon   Diabetes Sister    Diabetes Brother    Heart disease Maternal Grandmother    Heart disease Maternal Grandfather    Dementia Paternal Grandmother    Alzheimer's disease Paternal Grandfather    Diabetes Half-Sister    Allergies  Allergen Reactions   Onion Anaphylaxis   Aspirin Other (See Comments)  Drowsy  Drowsy  Drowsy       Review of Systems  Constitutional: Negative.   Eyes: Negative.   Respiratory: Negative.    Cardiovascular: Negative.   Genitourinary: Negative.   Skin: Negative.   Neurological: Negative.   Endo/Heme/Allergies: Negative.   Psychiatric/Behavioral: Negative.        Objective:     BP 123/82 (BP Location: Right Arm, Patient Position: Sitting, Cuff Size: Large)   Pulse 92   Ht 5\' 1"  (1.549 m)   Wt 232 lb 14.4 oz (105.6 kg)   SpO2 95%   BMI 44.01 kg/m  BP Readings from Last 3 Encounters:  12/13/23 123/82  09/15/23 125/89  07/14/23 125/81   Wt Readings from Last 3 Encounters:  12/13/23 232 lb 14.4  oz (105.6 kg)  09/15/23 234 lb 1.6 oz (106.2 kg)  07/14/23 231 lb 12.8 oz (105.1 kg)      Physical Exam HENT:     Head: Normocephalic and atraumatic.     Mouth/Throat:     Mouth: Mucous membranes are moist.  Eyes:     Pupils: Pupils are equal, round, and reactive to light.  Cardiovascular:     Rate and Rhythm: Normal rate and regular rhythm.     Pulses: Normal pulses.     Heart sounds: Normal heart sounds.  Pulmonary:     Effort: Pulmonary effort is normal.     Breath sounds: Normal breath sounds.  Skin:    General: Skin is warm.     Capillary Refill: Capillary refill takes less than 2 seconds.  Neurological:     General: No focal deficit present.     Mental Status: She is alert and oriented to person, place, and time.  Psychiatric:        Mood and Affect: Mood normal.        Behavior: Behavior normal.      No results found for any visits on 12/13/23.  Last CBC Lab Results  Component Value Date   WBC 8.5 12/15/2022   HGB 11.0 (L) 12/15/2022   HCT 34.3 12/15/2022   MCV 79 12/15/2022   MCH 25.3 (L) 12/15/2022   RDW 14.7 12/15/2022   PLT 370 12/15/2022   Last metabolic panel Lab Results  Component Value Date   GLUCOSE 115 (H) 12/15/2022   NA 141 12/15/2022   K 4.5 12/15/2022   CL 103 12/15/2022   CO2 24 12/15/2022   BUN 10 12/15/2022   CREATININE 0.70 12/15/2022   EGFR 103 12/15/2022   CALCIUM 9.2 12/15/2022   PROT 6.9 12/15/2022   ALBUMIN 3.8 12/15/2022   LABGLOB 3.1 12/15/2022   AGRATIO 1.2 12/15/2022   BILITOT 0.4 12/15/2022   ALKPHOS 112 12/15/2022   AST 13 12/15/2022   ALT 10 12/15/2022   ANIONGAP 12 05/31/2020   Last lipids Lab Results  Component Value Date   CHOL 170 12/15/2022   HDL 41 12/15/2022   LDLCALC 103 (H) 12/15/2022   TRIG 147 12/15/2022   CHOLHDL 4.1 12/15/2022   Last hemoglobin A1c Lab Results  Component Value Date   HGBA1C 7.3 (A) 09/15/2023   Last thyroid functions Lab Results  Component Value Date   TSH 0.931  12/09/2021   Last vitamin D Lab Results  Component Value Date   VD25OH 30.3 06/16/2023   Last vitamin B12 and Folate No results found for: "VITAMINB12", "FOLATE"    The 10-year ASCVD risk score (Arnett DK, et al., 2019) is: 3.9%    Assessment &  Plan:   1. Type 2 diabetes mellitus without complication, without long-term current use of insulin (HCC) (Primary)  Her  diabetes is show improvement. , she reached her goal of A1C- 6.8 %  during today visit.  She is  making healthy  meal choices. She stop soft drinks and  making conscious choice to drink more water.  She will continue current medication, and exercise as tolerated.  - POCT HgB A1C - POCT Glucose (CBG) - metFORMIN (GLUCOPHAGE-XR) 500 MG 24 hr tablet; Take 2 tablets (1,000 mg total) by mouth daily with breakfast.  Dispense: 180 tablet; Refill: 0 - Urine Microalbumin w/creat. ratio; Future  2. Elevated lipids   The 10-year ASCVD risk score (Arnett DK, et al., 2019) is: 3.9%   Values used to calculate the score:     Age: 86 years     Sex: Female     Is Non-Hispanic African American: No     Diabetic: Yes     Tobacco smoker: No     Systolic Blood Pressure: 123 mmHg     Is BP treated: No     HDL Cholesterol: 41 mg/dL     Total Cholesterol: 170 mg/dL   - pravastatin (PRAVACHOL) 20 MG tablet; Take 1 tablet (20 mg total) by mouth daily.  Dispense: 90 tablet; Refill: 0 - Lipid panel; Future She will continue current treatment plan, low fat/cholesterol diet and exercise as tolerated.  3. Health care maintenance  Blood work ordered and collected  for annual screening.  - Comp Met (CMET); Future - CBC with Differential/Platelet; Future - Vitamin D (25 hydroxy); Future  4. Dermatitis associated with moisture  She started on Lotrisone for dermatitis.  She was advise to  allow skin completely dry at the abdominal fold  after showers and  use Lotrisone  cream with recurrent   redness . - clotrimazole-betamethasone  (LOTRISONE) cream; Apply 1 Application topically 2 (two) times daily.  Dispense: 30 g; Refill: 0     Return in about 13 weeks (around 03/13/2024).    Teofilo Pod, RN

## 2023-12-13 NOTE — Patient Instructions (Signed)

## 2023-12-14 ENCOUNTER — Ambulatory Visit: Payer: Self-pay | Admitting: Gerontology

## 2023-12-14 ENCOUNTER — Emergency Department
Admission: EM | Admit: 2023-12-14 | Discharge: 2023-12-14 | Disposition: A | Discharge status: Home / Self Care | Payer: Worker's Compensation | Attending: Emergency Medicine | Admitting: Emergency Medicine

## 2023-12-14 ENCOUNTER — Other Ambulatory Visit: Payer: Self-pay | Admitting: Gerontology

## 2023-12-14 ENCOUNTER — Other Ambulatory Visit: Payer: Self-pay

## 2023-12-14 ENCOUNTER — Emergency Department: Payer: Worker's Compensation

## 2023-12-14 DIAGNOSIS — M25562 Pain in left knee: Secondary | ICD-10-CM

## 2023-12-14 DIAGNOSIS — E559 Vitamin D deficiency, unspecified: Secondary | ICD-10-CM

## 2023-12-14 DIAGNOSIS — M25572 Pain in left ankle and joints of left foot: Secondary | ICD-10-CM | POA: Diagnosis present

## 2023-12-14 DIAGNOSIS — W19XXXA Unspecified fall, initial encounter: Secondary | ICD-10-CM

## 2023-12-14 DIAGNOSIS — Y990 Civilian activity done for income or pay: Secondary | ICD-10-CM | POA: Diagnosis not present

## 2023-12-14 DIAGNOSIS — W010XXA Fall on same level from slipping, tripping and stumbling without subsequent striking against object, initial encounter: Secondary | ICD-10-CM | POA: Diagnosis not present

## 2023-12-14 LAB — CBC WITH DIFFERENTIAL/PLATELET
Basophils Absolute: 0 10*3/uL (ref 0.0–0.2)
Basos: 0 %
EOS (ABSOLUTE): 0.3 10*3/uL (ref 0.0–0.4)
Eos: 3 %
Hematocrit: 35.4 % (ref 34.0–46.6)
Hemoglobin: 11.2 g/dL (ref 11.1–15.9)
Immature Grans (Abs): 0 10*3/uL (ref 0.0–0.1)
Immature Granulocytes: 0 %
Lymphocytes Absolute: 3.1 10*3/uL (ref 0.7–3.1)
Lymphs: 33 %
MCH: 25.2 pg — ABNORMAL LOW (ref 26.6–33.0)
MCHC: 31.6 g/dL (ref 31.5–35.7)
MCV: 80 fL (ref 79–97)
Monocytes Absolute: 0.4 10*3/uL (ref 0.1–0.9)
Monocytes: 4 %
Neutrophils Absolute: 5.7 10*3/uL (ref 1.4–7.0)
Neutrophils: 60 %
Platelets: 358 10*3/uL (ref 150–450)
RBC: 4.45 x10E6/uL (ref 3.77–5.28)
RDW: 15.3 % (ref 11.7–15.4)
WBC: 9.5 10*3/uL (ref 3.4–10.8)

## 2023-12-14 LAB — MICROALBUMIN / CREATININE URINE RATIO
Creatinine, Urine: 159.2 mg/dL
Microalb/Creat Ratio: 10 mg/g{creat} (ref 0–29)
Microalbumin, Urine: 15.4 ug/mL

## 2023-12-14 LAB — COMPREHENSIVE METABOLIC PANEL
ALT: 12 IU/L (ref 0–32)
AST: 15 IU/L (ref 0–40)
Albumin: 4 g/dL (ref 3.8–4.9)
Alkaline Phosphatase: 120 IU/L (ref 44–121)
BUN/Creatinine Ratio: 19 (ref 9–23)
BUN: 13 mg/dL (ref 6–24)
Bilirubin Total: 0.3 mg/dL (ref 0.0–1.2)
CO2: 22 mmol/L (ref 20–29)
Calcium: 9.3 mg/dL (ref 8.7–10.2)
Chloride: 103 mmol/L (ref 96–106)
Creatinine, Ser: 0.69 mg/dL (ref 0.57–1.00)
Globulin, Total: 2.7 g/dL (ref 1.5–4.5)
Glucose: 109 mg/dL — ABNORMAL HIGH (ref 70–99)
Potassium: 4.5 mmol/L (ref 3.5–5.2)
Sodium: 140 mmol/L (ref 134–144)
Total Protein: 6.7 g/dL (ref 6.0–8.5)
eGFR: 102 mL/min/{1.73_m2} (ref >59)

## 2023-12-14 LAB — LIPID PANEL
Chol/HDL Ratio: 4 ratio (ref 0.0–4.4)
Cholesterol, Total: 190 mg/dL (ref 100–199)
HDL: 47 mg/dL (ref >39)
LDL Chol Calc (NIH): 116 mg/dL — ABNORMAL HIGH (ref 0–99)
Triglycerides: 150 mg/dL — ABNORMAL HIGH (ref 0–149)
VLDL Cholesterol Cal: 27 mg/dL (ref 5–40)

## 2023-12-14 LAB — VITAMIN D 25 HYDROXY (VIT D DEFICIENCY, FRACTURES): Vit D, 25-Hydroxy: 14.5 ng/mL — ABNORMAL LOW (ref 30.0–100.0)

## 2023-12-14 MED ORDER — OXYCODONE-ACETAMINOPHEN 5-325 MG PO TABS
1.0000 | ORAL_TABLET | Freq: Four times a day (QID) | ORAL | 0 refills | Status: DC | PRN
  Filled 2023-12-14: qty 20, 5d supply, fill #0

## 2023-12-14 MED ORDER — OXYCODONE-ACETAMINOPHEN 5-325 MG PO TABS
1.0000 | ORAL_TABLET | Freq: Once | ORAL | Status: AC
  Administered 2023-12-14: 1 via ORAL
  Filled 2023-12-14: qty 1

## 2023-12-14 MED ORDER — OXYCODONE-ACETAMINOPHEN 5-325 MG PO TABS: 1.0000 | ORAL_TABLET | Freq: Four times a day (QID) | ORAL | 0 refills | Status: DC | PRN

## 2023-12-14 MED ORDER — VITAMIN D (ERGOCALCIFEROL) 1.25 MG (50000 UNIT) PO CAPS
50000.0000 [IU] | ORAL_CAPSULE | ORAL | 0 refills | Status: DC
  Filled 2023-12-14: qty 12, 84d supply, fill #0
  Filled 2024-02-29: qty 4, 28d supply, fill #1

## 2023-12-14 NOTE — ED Notes (Signed)
 This tech assisted pt to restroom in wheelchair. Pt is back in room resting comfortably.

## 2023-12-14 NOTE — ED Provider Notes (Signed)
 [facility] Provider Note  Patient Contact: 11:07 AM (approximate)   History   Fall   HPI  Lisa Howell is a 53 y.o. female who presents to the emergency department complaining of left knee and left ankle pain.  Patient states that she slipped at work, landed with her leg twisted underneath her.  Did not hit her head or lose consciousness.  She is complaining of left knee left ankle with the left knee being the most painful.  No history of previous surgeries.  Patient is unable to bear weight at this time due to pain.     Physical Exam   Triage Vital Signs: ED Triage Vitals  Encounter Vitals Group     BP 12/14/23 1105 (!) 162/89     Systolic BP Percentile --      Diastolic BP Percentile --      Pulse Rate 12/14/23 1105 (!) 106     Resp 12/14/23 1105 20     Temp 12/14/23 1105 98.1 F (36.7 C)     Temp Source 12/14/23 1105 Oral     SpO2 12/14/23 1105 100 %     Weight --      Height --      Head Circumference --      Peak Flow --      Pain Score 12/14/23 1100 8     Pain Loc --      Pain Education --      Exclude from Growth Chart --     Most recent vital signs: Vitals:   12/14/23 1105  BP: (!) 162/89  Pulse: (!) 106  Resp: 20  Temp: 98.1 F (36.7 C)  SpO2: 100%     General: Alert and in no acute distress. Head: No acute traumatic findings  Neck: No stridor. No cervical spine tenderness to palpation.  Cardiovascular:  Good peripheral perfusion Respiratory: Normal respiratory effort without tachypnea or retractions. Lungs CTAB. Good air entry to the bases with no decreased or absent breath sounds Musculoskeletal: Full range of motion to all extremities.  Visualization of the left knee reveals slight edema when compared with right.  Patient has significant tenderness along the bilateral joint lines worse on the medial aspect.  There is no ballottement, ecchymosis identified to the knee.  No significant range of motion is performed  prior to imaging.  Patient ankle is evaluated with slight tenderness along the lateral malleolus without palpable abnormality.  No gross edema or ecchymosis.  Pulse and sensation intact distally.  No tenderness over the hip or lumbar spine. Neurologic:  No gross focal neurologic deficits are appreciated.  Skin:   No rash noted Other:   ED Results / Procedures / Treatments   Labs (all labs ordered are listed, but only abnormal results are displayed) Labs Reviewed - No data to display   EKG     RADIOLOGY    No results found.  PROCEDURES:  Critical Care performed: No  Procedures   MEDICATIONS ORDERED IN ED: Medications - No data to display   IMPRESSION / MDM / ASSESSMENT AND PLAN / ED COURSE  I reviewed the triage vital signs and the nursing notes.                                 Differential diagnosis includes, but is not limited to, knee sprain, knee contusion, derangement of the knee, tibial plateau fracture, fibular head  fracture, patellar dislocation, ankle sprain, ankle fracture   Patient's presentation is most consistent with acute presentation with potential threat to life or bodily function.   Patient arrived to the emergency department via EMS after a mechanical fall.  Patient slipped and fell landing awkwardly on her left lower extremity.  She states that the left lower extremity twisted up underneath her and she landed directly on it.  Patient is having no back pain or hip pain.  She has pain in both the left knee and left ankle with the worst pain in the knee.  Currently imaging has not returned for this patient at shift change.  I will hand the patient over to my colleague, PA Romeo Apple.  I suspect that patient will likely need immobilization of her knee, possibly her ankle as well and likely referral to orthopedics.  If there is concerning findings such as tibial plateau fracture then patient may require either admission or transfer.    Note:  This  document was prepared using Dragon voice recognition software and may include unintentional dictation errors.   Racheal Patches, PA-C 12/14/23 1301    [name], MD 12/14/23 1343

## 2023-12-14 NOTE — ED Provider Notes (Signed)
-----------------------------------------   1:09 PM on 12/14/2023 -----------------------------------------  Blood pressure (!) 162/89, pulse (!) 106, temperature 98.1 F (36.7 C), temperature source Oral, resp. rate 20, SpO2 100%.  Assuming care from PA Cuthriell.  In short, Lisa Howell is a 53 y.o. female with a chief complaint of Fall .  Refer to the original H&P for additional details.  The current plan of care is to await left knee and left ankle images.   ----------------------------------------- 2:09 PM on 12/14/2023 -----------------------------------------  Updated patient's on imaging.  They are overall reassuring.  Patient placed in knee sleeve and ankle brace and provided crutches.  She is encouraged nonweightbearing status for 3 days with gradual increase as tolerated.  RICE therapy education provided and patient is encouraged to follow-up with orthopedics if symptoms do not improve in 1 week.  Work note given.  Patient in stable condition for discharge home.         Romeo Apple, Lisa Howell A, PA-C 12/14/23 1411    Lisa Lima, MD 12/14/23 [phone]

## 2023-12-14 NOTE — Discharge Instructions (Signed)
 You were evaluated in the ED for left knee and ankle pain following a fall.  Your x-rays are normal there is no broken bone or dislocations.  We suspect this to be a sprain.  We have provided you a knee sleeve and ankle brace to wear for comfort.  You have also been provided crutches in which we would like for you to be on nonweightbearing status for 3 days with gradual increase as tolerated.  Plenty of rest.  Apply ice to the affected area and keep the left leg elevated above heart level is much as possible.  You can do this by placing 2-3 pillows underneath your ankle at night while laying down.  If symptoms do not improve in 1 week please follow-up with orthopedics.  Do not take Tylenol with pain medication prescribed to you.

## 2023-12-14 NOTE — ED Triage Notes (Addendum)
 Pt arrived via EMS after slipping in a rain puddle and now has left knee pain.

## 2023-12-20 ENCOUNTER — Telehealth: Payer: Self-pay | Admitting: Gerontology

## 2023-12-20 NOTE — Telephone Encounter (Signed)
 Ergocalciferol was ordered

## 2023-12-22 ENCOUNTER — Ambulatory Visit: Payer: Self-pay | Admitting: Gerontology

## 2023-12-22 VITALS — BP 138/92 | HR 95 | Temp 98.7°F | Wt 238.8 lb

## 2023-12-22 DIAGNOSIS — E119 Type 2 diabetes mellitus without complications: Secondary | ICD-10-CM

## 2023-12-22 MED ORDER — SEMAGLUTIDE(0.25 OR 0.5MG/DOS) 2 MG/3ML ~~LOC~~ SOPN
0.5000 mg | PEN_INJECTOR | SUBCUTANEOUS | 0 refills | Status: DC
  Filled 2023-12-22: qty 3, fill #0
  Filled 2024-02-02: qty 3, 28d supply, fill #0

## 2023-12-22 MED ORDER — OZEMPIC (1 MG/DOSE) 2 MG/1.5ML ~~LOC~~ SOPN
1.0000 mg | PEN_INJECTOR | SUBCUTANEOUS | 2 refills | Status: DC
  Filled 2023-12-22: qty 1.5, 14d supply, fill #0

## 2023-12-22 MED ORDER — OZEMPIC (0.25 OR 0.5 MG/DOSE) 2 MG/3ML ~~LOC~~ SOPN
0.2500 mg | PEN_INJECTOR | SUBCUTANEOUS | 0 refills | Status: DC
  Filled 2023-12-22: qty 3, 28d supply, fill #0

## 2023-12-22 NOTE — Progress Notes (Signed)
 [facility] Endocrinology Follow-up Visit  Assessment & Plan:   1. Type 2 diabetes mellitus without complication, without long-term current use of insulin (HCC) (Primary) - CONTINUE Metformin 500 mg BID, decrease to 500 mg daily (1 tablet) once Ozempic has been titrated to 1 mg - START Ozempic 0.25 mg, plan to up-titrate to 0.5 mg in 4 weeks or as tolerated - counseled on diet/exercise, and potential for weight gain if Ozempic is discontinued without exercise/lifestyle changes.   2. Elevated lipids -CONTINUE Pravastatin 20 mg -10-year ASCVD risk score (Arnett DK, et al., 2019) is: 3.9% -continue to monitor lipids -She will continue current treatment plan, low fat/cholesterol diet and exercise as tolerated.  3. Vitamin D Deficiency -Continue 50,000 U cholecalciferol weekly   Subjective   Patient ID: Lisa TURKINGTON, female    DOB: 1971-03-02  Age: 53 y.o. MRN: [id number]  No chief complaint on file.   HPI  Lisa Howell is a 53 y/o female with history of Type 2 Diabetes and hyperlipidemia who presents to the clinic for follow up visit. She states that she's compliant with her Metformin 1000 mg and pravastatin 20 mg, denies side effects and continues to make healthy lifestyle changes. Exercise has been difficult after spraining her L knee/ankle. She notes her weight has gone up over the past few months and is interested in starting Ozempic. Her HgbA1c checked during visit decreased from 7.3% to 6.8% and her blood glucose was 115 mg/dl.  She is monitoring her glucose reading twice a day before breakfast and before dinner. Her fasting blood glucose readings  are below <120 mg/dl. She denies hypoglycemic and hyperglycemic symptoms, peripheral neuropathy and performs daily foot checks. Overall, she states that she's doing well and offers no further complaint.  She notes a history of bone/colon cancer on her mother's side. No Hx of thyroid, kidney disease or liver disease.   Patient Active  Problem List   Diagnosis Date Noted   Dermatitis associated with moisture 12/13/2023   Sinus pressure 06/02/2023   Thumb pain, left 03/17/2023   Skin lesion 02/03/2023   Unspecified chronic bronchitis (HCC) 11/11/2022   Nasal congestion 10/13/2022   Cough 09/15/2022   Wheezing 09/15/2022   Health care maintenance 03/17/2022   Encounter for screening colonoscopy    Polyp of transverse colon    Obesity 12/17/2020   Dental decay 09/11/2020   Elevated lipids 08/13/2020   Fluttering sensation of heart 08/13/2020   Encounter to establish care 07/10/2020   Type 2 diabetes mellitus without complications (HCC) 07/10/2020   Hospital discharge follow-up 07/10/2020   Pneumonia due to 2019-nCoV 05/27/2020   Chronic tension-type headache, not intractable 09/18/2015   Headache 02/25/2015   Past Medical History:  Diagnosis Date   Diabetes mellitus without complication (HCC)    Hyperlipidemia    Past Surgical History:  Procedure Laterality Date   ABDOMINAL HYSTERECTOMY     CESAREAN SECTION     CHOLECYSTECTOMY     COLONOSCOPY WITH PROPOFOL N/A 12/30/2020   Procedure: COLONOSCOPY WITH PROPOFOL;  Surgeon: Midge Minium, MD;  Location: [facility] ENDOSCOPY;  Service: Endoscopy;  Laterality: N/A;   HERNIA REPAIR  1998   c-section site   Social History   Tobacco Use   Smoking status: Former    Current packs/day: 0.00    Types: Cigarettes    Start date: 12/30/1997    Quit date: 12/31/1999    Years since quitting: 23.9   Smokeless tobacco: Never  Vaping Use   Vaping status: Never  Used  Substance Use Topics   Alcohol use: Yes    Comment: rarely, maybe 1 drink a year   Drug use: No   Family History  Problem Relation Age of Onset   Diabetes Mother    Heart disease Mother    Cancer Mother        "bone"   Diabetes Father    Cancer Father        colon   Diabetes Sister    Diabetes Brother    Heart disease Maternal Grandmother    Heart disease Maternal Grandfather    Dementia Paternal  Grandmother    Alzheimer's disease Paternal Grandfather    Diabetes Half-Sister    Allergies  Allergen Reactions   Onion Anaphylaxis   Aspirin Other (See Comments)    Drowsy  Drowsy  Drowsy       Review of Systems  Constitutional: Negative.   Eyes: Negative.   Respiratory: Negative.    Cardiovascular: Negative.   Genitourinary: Negative.   Skin: Negative.   Neurological: Negative.   Endo/Heme/Allergies: Negative.   Psychiatric/Behavioral: Negative.        Objective:     BP (!) 138/92 (BP Location: Right Arm, Patient Position: Sitting)   Pulse 95   Temp 98.7 F (37.1 C) (Oral)   Wt 238 lb 12.8 oz (108.3 kg) Comment: pt. has a ortho boot to left lower leg.  BMI 45.12 kg/m  BP Readings from Last 3 Encounters:  12/22/23 (!) 138/92  12/14/23 (!) 162/89  12/13/23 123/82   Wt Readings from Last 3 Encounters:  12/22/23 238 lb 12.8 oz (108.3 kg)  12/13/23 232 lb 14.4 oz (105.6 kg)  09/15/23 234 lb 1.6 oz (106.2 kg)      Physical Exam HENT:     Head: Normocephalic and atraumatic.     Mouth/Throat:     Mouth: Mucous membranes are moist.  Eyes:     Pupils: Pupils are equal, round, and reactive to light.  Cardiovascular:     Rate and Rhythm: Normal rate and regular rhythm.     Pulses: Normal pulses.     Heart sounds: Normal heart sounds.  Pulmonary:     Effort: Pulmonary effort is normal.     Breath sounds: Normal breath sounds.  Skin:    General: Skin is warm.     Capillary Refill: Capillary refill takes less than 2 seconds.  Neurological:     General: No focal deficit present.     Mental Status: She is alert and oriented to person, place, and time.  Psychiatric:        Mood and Affect: Mood normal.        Behavior: Behavior normal.     No results found for any visits on 12/22/23.  Last CBC Lab Results  Component Value Date   WBC 9.5 12/13/2023   HGB 11.2 12/13/2023   HCT 35.4 12/13/2023   MCV 80 12/13/2023   MCH 25.2 (L) 12/13/2023   RDW 15.3  12/13/2023   PLT 358 12/13/2023   Last metabolic panel Lab Results  Component Value Date   GLUCOSE 109 (H) 12/13/2023   NA 140 12/13/2023   K 4.5 12/13/2023   CL 103 12/13/2023   CO2 22 12/13/2023   BUN 13 12/13/2023   CREATININE 0.69 12/13/2023   EGFR 102 12/13/2023   CALCIUM 9.3 12/13/2023   PROT 6.7 12/13/2023   ALBUMIN 4.0 12/13/2023   LABGLOB 2.7 12/13/2023   AGRATIO 1.2 12/15/2022   BILITOT  0.3 12/13/2023   ALKPHOS 120 12/13/2023   AST 15 12/13/2023   ALT 12 12/13/2023   ANIONGAP 12 05/31/2020   Last lipids Lab Results  Component Value Date   CHOL 190 12/13/2023   HDL 47 12/13/2023   LDLCALC 116 (H) 12/13/2023   TRIG 150 (H) 12/13/2023   CHOLHDL 4.0 12/13/2023   Last hemoglobin A1c Lab Results  Component Value Date   HGBA1C 6.8 (A) 12/13/2023   Last thyroid functions Lab Results  Component Value Date   TSH 0.931 12/09/2021   Last vitamin D Lab Results  Component Value Date   VD25OH 14.5 (L) 12/13/2023   Last vitamin B12 and Folate No results found for: "VITAMINB12", "FOLATE"    The 10-year ASCVD risk score (Arnett DK, et al., 2019) is: 4.8%      No follow-ups on file.    Berenda Morale, Medical Student

## 2023-12-22 NOTE — Patient Instructions (Signed)

## 2023-12-23 ENCOUNTER — Other Ambulatory Visit: Payer: Self-pay

## 2023-12-26 ENCOUNTER — Other Ambulatory Visit: Payer: Self-pay

## 2024-01-04 ENCOUNTER — Other Ambulatory Visit: Payer: Self-pay

## 2024-01-05 ENCOUNTER — Other Ambulatory Visit (HOSPITAL_BASED_OUTPATIENT_CLINIC_OR_DEPARTMENT_OTHER): Payer: Self-pay

## 2024-01-05 ENCOUNTER — Other Ambulatory Visit: Payer: Self-pay

## 2024-01-06 ENCOUNTER — Other Ambulatory Visit: Payer: Self-pay | Admitting: Orthopedic Surgery

## 2024-01-06 ENCOUNTER — Other Ambulatory Visit: Payer: Self-pay

## 2024-01-06 DIAGNOSIS — M1712 Unilateral primary osteoarthritis, left knee: Secondary | ICD-10-CM

## 2024-01-06 DIAGNOSIS — M19072 Primary osteoarthritis, left ankle and foot: Secondary | ICD-10-CM

## 2024-01-09 ENCOUNTER — Other Ambulatory Visit: Payer: Self-pay

## 2024-01-09 MED ORDER — CELECOXIB 200 MG PO CAPS
200.0000 mg | ORAL_CAPSULE | Freq: Every day | ORAL | 0 refills | Status: AC
  Filled 2024-01-09: qty 30, 30d supply, fill #0

## 2024-01-13 ENCOUNTER — Other Ambulatory Visit: Payer: Self-pay

## 2024-01-14 ENCOUNTER — Ambulatory Visit
Admission: RE | Admit: 2024-01-14 | Discharge: 2024-01-14 | Disposition: A | Discharge status: Home / Self Care | Payer: Self-pay | Source: Ambulatory Visit | Attending: Orthopedic Surgery | Admitting: Orthopedic Surgery

## 2024-01-14 DIAGNOSIS — M19072 Primary osteoarthritis, left ankle and foot: Secondary | ICD-10-CM

## 2024-01-14 DIAGNOSIS — M1712 Unilateral primary osteoarthritis, left knee: Secondary | ICD-10-CM

## 2024-01-17 ENCOUNTER — Other Ambulatory Visit: Payer: Self-pay

## 2024-01-19 ENCOUNTER — Ambulatory Visit: Payer: Self-pay | Admitting: Gerontology

## 2024-01-19 ENCOUNTER — Other Ambulatory Visit: Payer: Self-pay

## 2024-01-19 ENCOUNTER — Encounter: Payer: Self-pay | Admitting: Gerontology

## 2024-01-19 VITALS — BP 129/86 | HR 79 | Ht 61.0 in | Wt 224.0 lb

## 2024-01-19 DIAGNOSIS — E119 Type 2 diabetes mellitus without complications: Secondary | ICD-10-CM

## 2024-01-19 DIAGNOSIS — E785 Hyperlipidemia, unspecified: Secondary | ICD-10-CM

## 2024-01-19 DIAGNOSIS — M25562 Pain in left knee: Secondary | ICD-10-CM

## 2024-01-19 MED ORDER — METFORMIN HCL ER 500 MG PO TB24
1000.0000 mg | ORAL_TABLET | Freq: Every day | ORAL | 0 refills | Status: DC
  Filled 2024-01-19: qty 180, 90d supply, fill #0

## 2024-01-19 MED ORDER — OZEMPIC (1 MG/DOSE) 4 MG/3ML ~~LOC~~ SOPN
1.0000 mg | PEN_INJECTOR | SUBCUTANEOUS | 3 refills | Status: DC
  Filled 2024-03-01: qty 12, 112d supply, fill #0

## 2024-01-19 MED ORDER — PRAVASTATIN SODIUM 20 MG PO TABS
20.0000 mg | ORAL_TABLET | Freq: Every day | ORAL | 0 refills | Status: DC
Start: 2024-01-19 — End: 2024-03-13
  Filled 2024-01-19 – 2024-03-02 (×2): qty 90, 90d supply, fill #0

## 2024-01-19 NOTE — Progress Notes (Signed)
 Established Patient Office Visit  Subjective   Patient ID: Lisa Howell, female    DOB: [birth year]  Age: 53 y.o. MRN: [id number]  Chief Complaint  Patient presents with   Follow-up    DM    HPI  Lisa Howell is a 53 y/o female with history of Type 2 Diabetes and hyperlipidemia who presents to the clinic for follow up visit. She states that she is compliant with her medications, denies side effects and continues to make healthy lifestyle changes.  Her hemoglobin A1c checked on 12/13/2023 decreased from 7.3% to 6.8%.  She reports  checking her blood glucose  bid, her fasting readings are usually between  87-89  mg/dl and 409 mg/dl in the evening. She denies any polyuria, polyphagia, and polydipsia.  She reported she gets a lot of sunlight from her job and continue taking her Vit D. Reports she continue to loose weight since starting Ozempic.   Monofilament and tuning fork positive to right lower foot and left leg (warm to touch).  She reports a recent fall with injury on 12/14/2023 to her left Ankle/knee visited the ED. Currently with a short ortho immobilized boot in place to LLE. Discussed findings indicating no fractures or dislocation with mild chronic changes-plantar calcaneal spur. No significant knee joint effusion or erosion or focal bone abnormality. She states the pain is much better 4/10 intermittently with the prescribed ibuprofen and she will follow up with [employer] provider. Overall, she states  that she feel "good" with no further complaints.     Patient Active Problem List   Diagnosis Date Noted   Dermatitis associated with moisture 12/13/2023   Sinus pressure 06/02/2023   Thumb pain, left 03/17/2023   Skin lesion 02/03/2023   Unspecified chronic bronchitis (HCC) 11/11/2022   Nasal congestion 10/13/2022   Cough 09/15/2022   Wheezing 09/15/2022   Health care maintenance 03/17/2022   Encounter for screening colonoscopy    Polyp of transverse colon    Obesity  12/17/2020   Dental decay 09/11/2020   Elevated lipids 08/13/2020   Fluttering sensation of heart 08/13/2020   Encounter to establish care 07/10/2020   Type 2 diabetes mellitus without complications (HCC) 07/10/2020   Hospital discharge follow-up 07/10/2020   Pneumonia due to 2019-nCoV 05/27/2020   Chronic tension-type headache, not intractable 09/18/2015   Headache 02/25/2015   Past Medical History:  Diagnosis Date   Diabetes mellitus without complication (HCC)    Hyperlipidemia    Allergies  Allergen Reactions   Onion Anaphylaxis   Aspirin Other (See Comments)    Drowsy  Drowsy  Drowsy       Review of Systems  Constitutional: Negative.   Eyes: Negative.   Respiratory: Negative.    Cardiovascular: Negative.   Genitourinary: Negative.   Musculoskeletal: Negative.   Skin: Negative.   Neurological: Negative.   Endo/Heme/Allergies: Negative.   Psychiatric/Behavioral: Negative.        Objective:     BP 129/86   Pulse 79   Ht 5\' 1"  (1.549 m)   Wt 224 lb (101.6 kg)   SpO2 95%   BMI 42.32 kg/m  BP Readings from Last 3 Encounters:  01/19/24 129/86  12/22/23 (!) 138/92  12/14/23 (!) 162/89   Wt Readings from Last 3 Encounters:  01/19/24 224 lb (101.6 kg)  12/22/23 238 lb 12.8 oz (108.3 kg)  12/13/23 232 lb 14.4 oz (105.6 kg)   SpO2 Readings from Last 3 Encounters:  01/19/24 95%  12/14/23 100%  12/13/23 95%      Physical Exam HENT:     Head: Normocephalic and atraumatic.     Mouth/Throat:     Mouth: Mucous membranes are moist.  Eyes:     Extraocular Movements: Extraocular movements intact.     Pupils: Pupils are equal, round, and reactive to light.  Cardiovascular:     Rate and Rhythm: Normal rate and regular rhythm.     Pulses: Normal pulses.     Heart sounds: Normal heart sounds.  Pulmonary:     Effort: Pulmonary effort is normal.     Breath sounds: Normal breath sounds.  Musculoskeletal:        General: Tenderness and signs of injury  present. Normal range of motion.     Comments: Under the knee intermittent tenderness. Recent fall with no fractures or dislocations from the ED visit. Braces are in place to the LLL.  Skin:    General: Skin is warm.  Neurological:     General: No focal deficit present.     Mental Status: She is alert and oriented to person, place, and time.  Psychiatric:        Mood and Affect: Mood normal.        Behavior: Behavior normal.        Thought Content: Thought content normal.        Judgment: Judgment normal.      No results found for any visits on 01/19/24.  Last CBC Lab Results  Component Value Date   WBC 9.5 12/13/2023   HGB 11.2 12/13/2023   HCT 35.4 12/13/2023   MCV 80 12/13/2023   MCH 25.2 (L) 12/13/2023   RDW 15.3 12/13/2023   PLT 358 12/13/2023   Last metabolic panel Lab Results  Component Value Date   GLUCOSE 109 (H) 12/13/2023   NA 140 12/13/2023   K 4.5 12/13/2023   CL 103 12/13/2023   CO2 22 12/13/2023   BUN 13 12/13/2023   CREATININE 0.69 12/13/2023   EGFR 102 12/13/2023   CALCIUM 9.3 12/13/2023   PROT 6.7 12/13/2023   ALBUMIN 4.0 12/13/2023   LABGLOB 2.7 12/13/2023   AGRATIO 1.2 12/15/2022   BILITOT 0.3 12/13/2023   ALKPHOS 120 12/13/2023   AST 15 12/13/2023   ALT 12 12/13/2023   ANIONGAP 12 05/31/2020   Last lipids Lab Results  Component Value Date   CHOL 190 12/13/2023   HDL 47 12/13/2023   LDLCALC 116 (H) 12/13/2023   TRIG 150 (H) 12/13/2023   CHOLHDL 4.0 12/13/2023   Last hemoglobin A1c Lab Results  Component Value Date   HGBA1C 6.8 (A) 12/13/2023   Last thyroid functions Lab Results  Component Value Date   TSH 0.931 12/09/2021   Last vitamin D Lab Results  Component Value Date   VD25OH 14.5 (L) 12/13/2023   Last vitamin B12 and Folate No results found for: "VITAMINB12", "FOLATE"    The 10-year ASCVD risk score (Arnett DK, et al., 2019) is: 4.3%    Assessment & Plan:   1. Type 2 diabetes mellitus without complication,  without long-term current use of insulin (HCC) (Primary) -Her diabetes is improving, though her HgbA1c was 6.8%, She will continue on current medication, check her blood glucose bid, record and bring log to follow up appointment. She was encouraged to pick up her medications from the pharmacy and continues on low carb/non concentrated sweet diet and exercise as tolerated - metFORMIN (GLUCOPHAGE-XR) 500 MG 24 hr tablet; Take 2 tablets (1,000 mg  total) by mouth daily with breakfast.  Dispense: 180 tablet; Refill: 0  2. Elevated lipids -The 10-year ASCVD risk score (Arnett DK, et al., 2019) is: 4.3%   Values used to calculate the score:     Age: 74 years     Sex: Female     Is Non-Hispanic African American: No     Diabetic: Yes     Tobacco smoker: No     Systolic Blood Pressure: 129 mmHg     Is BP treated: No     HDL Cholesterol: 47 mg/dL     Total Cholesterol: 190 mg/dL -Her ASCVD risk is 1.1%, and will continue on current medication, was encouraged to continue on low fat/heart healthy diet, exercise as tolerated. - pravastatin (PRAVACHOL) 20 MG tablet; Take 1 tablet (20 mg total) by mouth daily.  Dispense: 90 tablet; Refill: 0  3. Left knee pain, unspecified chronicity -She was informed to continue the prescribed medication for her intermitted pain 4/10 today and contact the the provider to scheduled a follow up appointment concerning her LLE. She was advised to go to the ED for worsening symptoms.  Return in about 8 weeks (around 03/15/2024), or if symptoms worsen or fail to improve.    Elliot Cousin, RN

## 2024-01-19 NOTE — Patient Instructions (Signed)

## 2024-02-02 ENCOUNTER — Other Ambulatory Visit: Payer: Self-pay

## 2024-02-03 ENCOUNTER — Other Ambulatory Visit: Payer: Self-pay

## 2024-02-16 ENCOUNTER — Ambulatory Visit: Payer: Self-pay | Admitting: Gerontology

## 2024-02-16 VITALS — BP 143/80 | HR 85 | Ht 62.5 in | Wt 223.3 lb

## 2024-02-16 DIAGNOSIS — R030 Elevated blood-pressure reading, without diagnosis of hypertension: Secondary | ICD-10-CM

## 2024-02-16 MED ORDER — BLOOD PRESSURE KIT
1.0000 | PACK | Freq: Every day | 0 refills | Status: AC
  Filled 2024-02-16: qty 1, fill #0

## 2024-02-16 NOTE — Patient Instructions (Signed)

## 2024-02-17 ENCOUNTER — Other Ambulatory Visit: Payer: Self-pay

## 2024-02-28 NOTE — Progress Notes (Signed)
 [facility] Endocrinology Follow-up Visit  Assessment & Plan:   1. Type 2 diabetes mellitus without complication, without long-term current use of insulin  (HCC) (Primary) Her Ha1c is on target at 6.8% (March 2025), down from 7.3% in Dec 2024. Continue current regimen.  - Counseled on diet, exercise and healthy lifestyle changes.  - Continue Metformin  500 mg BID (1000 mg total) - Continue Ozempic  1 mg weekly    2. Elevated lipids -CONTINUE Pravastatin  20 mg -10-year ASCVD risk score (Arnett DK, et al., 2019) is: 3.9% -continue to monitor lipids -She will continue current treatment plan, low fat/cholesterol diet and exercise as tolerated.  3. Vitamin D  Deficiency -Continue 50,000 U cholecalciferol  weekly   Subjective   Patient ID: Lisa Howell, female    DOB: 09-07-1971  Age: 53 y.o. MRN: [id number]  Chief Complaint  Patient presents with   Follow-up    T2D f/u     HPI  Lisa Howell is a 53 y/o female with history of Type 2 Diabetes and hyperlipidemia who presents to the clinic for follow up visit. She states that she's compliant with her Metformin  1000 mg and pravastatin  20 mg, denies side effects and continues to make healthy lifestyle changes. Exercise has been difficult after spraining her L knee/ankle. She is doing well on Ozempic  and denies any side effects related to the medication. Her HgbA1c checked during visit decreased from 7.3% (Dec 2024) to 6.8% (March 2024) and her blood glucose was 115 mg/dl (March 1027). Her weight has gone down 15 lbs from 238 lbs in March 2025 to 223 lbs in May 2025. She is monitoring her glucose reading twice a day before breakfast and before dinner. Her fasting blood glucose readings are below <120 mg/dl. She denies hypoglycemic and hyperglycemic symptoms, peripheral neuropathy and performs daily foot checks. Overall, she states that she's doing well and offers no further complaint.  She notes a history of bone/colon cancer on her mother's  side. No Hx of thyroid, kidney disease or liver disease.   Patient Active Problem List   Diagnosis Date Noted   Dermatitis associated with moisture 12/13/2023   Sinus pressure 06/02/2023   Thumb pain, left 03/17/2023   Skin lesion 02/03/2023   Unspecified chronic bronchitis (HCC) 11/11/2022   Nasal congestion 10/13/2022   Cough 09/15/2022   Wheezing 09/15/2022   Health care maintenance 03/17/2022   Encounter for screening colonoscopy    Polyp of transverse colon    Obesity 12/17/2020   Dental decay 09/11/2020   Elevated lipids 08/13/2020   Fluttering sensation of heart 08/13/2020   Encounter to establish care 07/10/2020   Type 2 diabetes mellitus without complications (HCC) 07/10/2020   Hospital discharge follow-up 07/10/2020   Pneumonia due to 2019-nCoV 05/27/2020   Chronic tension-type headache, not intractable 09/18/2015   Headache 02/25/2015   Past Medical History:  Diagnosis Date   Diabetes mellitus without complication (HCC)    Hyperlipidemia    Past Surgical History:  Procedure Laterality Date   ABDOMINAL HYSTERECTOMY     CESAREAN SECTION     CHOLECYSTECTOMY     COLONOSCOPY WITH PROPOFOL  N/A 12/30/2020   Procedure: COLONOSCOPY WITH PROPOFOL ;  Surgeon: Marnee Sink, MD;  Location: [facility] ENDOSCOPY;  Service: Endoscopy;  Laterality: N/A;   HERNIA REPAIR  1998   c-section site   Social History   Tobacco Use   Smoking status: Former    Current packs/day: 0.00    Types: Cigarettes    Start date: 12/30/1997    Quit  date: 12/31/1999    Years since quitting: 24.1   Smokeless tobacco: Never  Vaping Use   Vaping status: Never Used  Substance Use Topics   Alcohol use: Yes    Comment: rarely, maybe 1 drink a year   Drug use: No   Family History  Problem Relation Age of Onset   Diabetes Mother    Heart disease Mother    Cancer Mother        "bone"   Diabetes Father    Cancer Father        colon   Diabetes Sister    Diabetes Brother    Heart disease Maternal  Grandmother    Heart disease Maternal Grandfather    Dementia Paternal Grandmother    Alzheimer's disease Paternal Grandfather    Diabetes Half-Sister    Allergies  Allergen Reactions   Onion Anaphylaxis   Aspirin Other (See Comments)    Drowsy  Drowsy  Drowsy       Review of Systems  Constitutional: Negative.   Eyes: Negative.   Respiratory: Negative.    Cardiovascular: Negative.   Genitourinary: Negative.   Skin: Negative.   Neurological: Negative.   Endo/Heme/Allergies: Negative.   Psychiatric/Behavioral: Negative.        Objective:     BP (!) 143/80 (BP Location: Right Arm, Patient Position: Sitting) Comment: second 146/86, Lt arm,  Pulse 85   Ht 5' 2.5" (1.588 m)   Wt 223 lb 4.8 oz (101.3 kg)   BMI 40.19 kg/m  BP Readings from Last 3 Encounters:  02/16/24 (!) 143/80  01/19/24 129/86  12/22/23 (!) 138/92   Wt Readings from Last 3 Encounters:  02/16/24 223 lb 4.8 oz (101.3 kg)  01/19/24 224 lb (101.6 kg)  12/22/23 238 lb 12.8 oz (108.3 kg)      Physical Exam HENT:     Head: Normocephalic and atraumatic.     Mouth/Throat:     Mouth: Mucous membranes are moist.  Eyes:     Pupils: Pupils are equal, round, and reactive to light.  Cardiovascular:     Rate and Rhythm: Normal rate and regular rhythm.     Pulses: Normal pulses.     Heart sounds: Normal heart sounds.  Pulmonary:     Effort: Pulmonary effort is normal.     Breath sounds: Normal breath sounds.  Skin:    General: Skin is warm.     Capillary Refill: Capillary refill takes less than 2 seconds.  Neurological:     General: No focal deficit present.     Mental Status: She is alert and oriented to person, place, and time.  Psychiatric:        Mood and Affect: Mood normal.        Behavior: Behavior normal.     No results found for any visits on 02/16/24.  Last CBC Lab Results  Component Value Date   WBC 9.5 12/13/2023   HGB 11.2 12/13/2023   HCT 35.4 12/13/2023   MCV 80 12/13/2023    MCH 25.2 (L) 12/13/2023   RDW 15.3 12/13/2023   PLT 358 12/13/2023   Last metabolic panel Lab Results  Component Value Date   GLUCOSE 109 (H) 12/13/2023   NA 140 12/13/2023   K 4.5 12/13/2023   CL 103 12/13/2023   CO2 22 12/13/2023   BUN 13 12/13/2023   CREATININE 0.69 12/13/2023   EGFR 102 12/13/2023   CALCIUM 9.3 12/13/2023   PROT 6.7 12/13/2023   ALBUMIN 4.0  12/13/2023   LABGLOB 2.7 12/13/2023   AGRATIO 1.2 12/15/2022   BILITOT 0.3 12/13/2023   ALKPHOS 120 12/13/2023   AST 15 12/13/2023   ALT 12 12/13/2023   ANIONGAP 12 05/31/2020   Last lipids Lab Results  Component Value Date   CHOL 190 12/13/2023   HDL 47 12/13/2023   LDLCALC 116 (H) 12/13/2023   TRIG 150 (H) 12/13/2023   CHOLHDL 4.0 12/13/2023   Last hemoglobin A1c Lab Results  Component Value Date   HGBA1C 6.8 (A) 12/13/2023   Last thyroid functions Lab Results  Component Value Date   TSH 0.931 12/09/2021   Last vitamin D  Lab Results  Component Value Date   VD25OH 14.5 (L) 12/13/2023   Last vitamin B12 and Folate No results found for: "VITAMINB12", "FOLATE"    The 10-year ASCVD risk score (Arnett DK, et al., 2019) is: 5%      Return in about 4 weeks (around 03/15/2024), or if symptoms worsen or fail to improve.    Stclair Szymborski S Eathan Groman, [place]

## 2024-02-29 ENCOUNTER — Other Ambulatory Visit: Payer: Self-pay | Admitting: Gerontology

## 2024-02-29 ENCOUNTER — Other Ambulatory Visit: Payer: Self-pay

## 2024-02-29 DIAGNOSIS — E119 Type 2 diabetes mellitus without complications: Secondary | ICD-10-CM

## 2024-03-01 ENCOUNTER — Other Ambulatory Visit: Payer: Self-pay

## 2024-03-02 ENCOUNTER — Other Ambulatory Visit: Payer: Self-pay

## 2024-03-13 ENCOUNTER — Encounter: Payer: Self-pay | Admitting: Gerontology

## 2024-03-13 ENCOUNTER — Ambulatory Visit: Payer: Self-pay | Admitting: Gerontology

## 2024-03-13 ENCOUNTER — Other Ambulatory Visit: Payer: Self-pay

## 2024-03-13 VITALS — BP 109/76 | HR 99 | Wt 220.3 lb

## 2024-03-13 DIAGNOSIS — M25562 Pain in left knee: Secondary | ICD-10-CM | POA: Insufficient documentation

## 2024-03-13 DIAGNOSIS — E119 Type 2 diabetes mellitus without complications: Secondary | ICD-10-CM

## 2024-03-13 DIAGNOSIS — G8929 Other chronic pain: Secondary | ICD-10-CM

## 2024-03-13 DIAGNOSIS — E785 Hyperlipidemia, unspecified: Secondary | ICD-10-CM

## 2024-03-13 LAB — POCT GLYCOSYLATED HEMOGLOBIN (HGB A1C): Hemoglobin A1C: 6 % — AB (ref 4.0–5.6)

## 2024-03-13 LAB — GLUCOSE, POCT (MANUAL RESULT ENTRY): POC Glucose: 97 mg/dL (ref 70–99)

## 2024-03-13 MED ORDER — METFORMIN HCL ER 500 MG PO TB24
500.0000 mg | ORAL_TABLET | Freq: Every day | ORAL | 0 refills | Status: DC
  Filled 2024-03-13: qty 90, 90d supply, fill #0

## 2024-03-13 MED ORDER — PRAVASTATIN SODIUM 20 MG PO TABS
20.0000 mg | ORAL_TABLET | Freq: Every day | ORAL | 0 refills | Status: DC
  Filled 2024-03-13: qty 90, 90d supply, fill #0

## 2024-03-13 NOTE — Progress Notes (Deleted)
   Established Patient Office Visit  Subjective   Patient ID: Lisa Howell, female    DOB: Apr 28, 1971  Age: 53 y.o. MRN: [id number]  No chief complaint on file.   HPI Lisa Howell is a 53 y/o female with history of Type 2 Diabetes and hyperlipidemia who presents for routine follow up viit.   ROS     Objective:     There were no vitals taken for this visit. BP Readings from Last 3 Encounters:  02/16/24 (!) 143/80  01/19/24 129/86  12/22/23 (!) 138/92   Wt Readings from Last 3 Encounters:  02/16/24 223 lb 4.8 oz (101.3 kg)  01/19/24 224 lb (101.6 kg)  12/22/23 238 lb 12.8 oz (108.3 kg)      Physical Exam   No results found for any visits on 03/13/24.  Last CBC Lab Results  Component Value Date   WBC 9.5 12/13/2023   HGB 11.2 12/13/2023   HCT 35.4 12/13/2023   MCV 80 12/13/2023   MCH 25.2 (L) 12/13/2023   RDW 15.3 12/13/2023   PLT 358 12/13/2023   Last metabolic panel Lab Results  Component Value Date   GLUCOSE 109 (H) 12/13/2023   NA 140 12/13/2023   K 4.5 12/13/2023   CL 103 12/13/2023   CO2 22 12/13/2023   BUN 13 12/13/2023   CREATININE 0.69 12/13/2023   EGFR 102 12/13/2023   CALCIUM 9.3 12/13/2023   PROT 6.7 12/13/2023   ALBUMIN 4.0 12/13/2023   LABGLOB 2.7 12/13/2023   AGRATIO 1.2 12/15/2022   BILITOT 0.3 12/13/2023   ALKPHOS 120 12/13/2023   AST 15 12/13/2023   ALT 12 12/13/2023   ANIONGAP 12 05/31/2020   Last lipids Lab Results  Component Value Date   CHOL 190 12/13/2023   HDL 47 12/13/2023   LDLCALC 116 (H) 12/13/2023   TRIG 150 (H) 12/13/2023   CHOLHDL 4.0 12/13/2023   Last hemoglobin A1c Lab Results  Component Value Date   HGBA1C 6.8 (A) 12/13/2023   Last thyroid functions Lab Results  Component Value Date   TSH 0.931 12/09/2021   Last vitamin D  Lab Results  Component Value Date   VD25OH 14.5 (L) 12/13/2023   Last vitamin B12 and Folate No results found for: "VITAMINB12", "FOLATE"    The 10-year ASCVD  risk score (Arnett DK, et al., 2019) is: 5%    Assessment & Plan:   Problem List Items Addressed This Visit   None   No follow-ups on file.    Fernanda Twaddell E Deone Omahoney, NP

## 2024-03-13 NOTE — Progress Notes (Unsigned)
 Established Patient Office Visit  Subjective   Patient ID: Lisa Howell, female    DOB: 19-Jul-1971  Age: 53 y.o. MRN: [id number]  No chief complaint on file.   HPI  Lisa Howell is a 53 y/o female with history of Type 2 Diabetes and hyperlipidemia who presents to the clinic for follow up visit. Her HgbA1c decreased from 6.8% to 6%, she states that she is compliant with her medications , denies side effects and continues to make healthy lifestyle changes. She reports checking her blood glucose tid, and her fasting readings are less than 130 mg/dl, she reports that she had an episode of hypoglycemia one week ago and her blood glucose was 66 mg/dl.  She states that she started on 1 mg Ozempic . Her blood glucose was 97 mg/dl during visit. 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 She will follow up with workman's comp Orthopedic at [facility] clinic Overall, she states that she's doing well and offers no further complaint.  Review of Systems  Constitutional: Negative.   Eyes: Negative.   Respiratory: Negative.    Cardiovascular: Negative.   Musculoskeletal:  Positive for joint pain (left knee pain).  Neurological: Negative.   Psychiatric/Behavioral: Negative.        Objective:     There were no vitals taken for this visit. BP Readings from Last 3 Encounters:  03/13/24 109/76  02/16/24 (!) 143/80  01/19/24 129/86   Wt Readings from Last 3 Encounters:  03/13/24 220 lb 4.8 oz (99.9 kg)  02/16/24 223 lb 4.8 oz (101.3 kg)  01/19/24 224 lb (101.6 kg)      Physical Exam   No results found for any visits on 03/13/24.  Last CBC Lab Results  Component Value Date   WBC 9.5 12/13/2023   HGB 11.2 12/13/2023   HCT 35.4 12/13/2023   MCV 80 12/13/2023   MCH 25.2 (L)  12/13/2023   RDW 15.3 12/13/2023   PLT 358 12/13/2023   Last metabolic panel Lab Results  Component Value Date   GLUCOSE 109 (H) 12/13/2023   NA 140 12/13/2023   K 4.5 12/13/2023   CL 103 12/13/2023   CO2 22 12/13/2023   BUN 13 12/13/2023   CREATININE 0.69 12/13/2023   EGFR 102 12/13/2023   CALCIUM 9.3 12/13/2023   PROT 6.7 12/13/2023   ALBUMIN 4.0 12/13/2023   LABGLOB 2.7 12/13/2023   AGRATIO 1.2 12/15/2022   BILITOT 0.3 12/13/2023   ALKPHOS 120 12/13/2023   AST 15 12/13/2023   ALT 12 12/13/2023   ANIONGAP 12 05/31/2020   Last lipids Lab Results  Component Value Date   CHOL 190 12/13/2023   HDL 47 12/13/2023   LDLCALC 116 (H) 12/13/2023   TRIG 150 (H) 12/13/2023   CHOLHDL 4.0 12/13/2023   Last hemoglobin A1c Lab Results  Component Value Date   HGBA1C 6.8 (A) 12/13/2023   Last thyroid functions Lab Results  Component Value Date   TSH 0.931 12/09/2021   Last vitamin D  Lab Results  Component Value Date   VD25OH 14.5 (L) 12/13/2023   Last vitamin B12 and Folate No results found for: "VITAMINB12", "FOLATE"    The 10-year ASCVD risk score (Arnett DK, et al., 2019) is: 5%    Assessment & Plan:  Problem List Items Addressed This Visit   None   No follow-ups on file.    Amarya Kuehl E Magdalynn Davilla, NP

## 2024-03-13 NOTE — Patient Instructions (Signed)
 Dyslipidemia Dyslipidemia is an imbalance of waxy, fat-like substances (lipids) in the blood. The body needs lipids in small amounts. Dyslipidemia often involves a high level of cholesterol or triglycerides, which are types of lipids. Common forms of dyslipidemia include: High levels of LDL cholesterol. LDL is the type of cholesterol that causes fatty deposits (plaques) to build up in the blood vessels that carry blood away from the heart (arteries). Low levels of HDL cholesterol. HDL cholesterol is the type of cholesterol that protects against heart disease. High levels of HDL remove the LDL buildup from arteries. High levels of triglycerides. Triglycerides are a fatty substance in the blood that is linked to a buildup of plaques in the arteries. What are the causes? There are two main types of dyslipidemia: primary and secondary. Primary dyslipidemia is caused by changes (mutations) in genes that are passed down through families (inherited). These mutations cause several types of dyslipidemia. Secondary dyslipidemia may be caused by various risk factors that can lead to the disease, such as lifestyle choices and certain medical conditions. What increases the risk? You are more likely to develop this condition if you are an older man or if you are a woman who has gone through menopause. Other risk factors include: Having a family history of dyslipidemia. Taking certain medicines, including birth control pills, steroids, some diuretics, and beta-blockers. Eating a diet high in saturated fat. Smoking cigarettes or excessive alcohol intake. Having certain medical conditions such as diabetes, polycystic ovary syndrome (PCOS), kidney disease, liver disease, or hypothyroidism. Not exercising regularly. Being overweight or obese with too much belly fat. What are the signs or symptoms? In most cases, dyslipidemia does not usually cause any symptoms. In severe cases, very high lipid levels can  cause: Fatty bumps under the skin (xanthomas). A white or gray ring around the black center (pupil) of the eye. Very high triglyceride levels can cause inflammation of the pancreas (pancreatitis). How is this diagnosed? Your health care provider may diagnose dyslipidemia based on a routine blood test (fasting blood test). Because most people do not have symptoms of the condition, this blood testing (lipid profile) is done on adults age 66 and older and is repeated every 4-6 years. This test checks: Total cholesterol. This measures the total amount of cholesterol in your blood, including LDL cholesterol, HDL cholesterol, and triglycerides. A healthy number is below 200 mg/dL (4.09 mmol/L). LDL cholesterol. The target number for LDL cholesterol is different for each person, depending on individual risk factors. A healthy number is usually below 100 mg/dL (8.11 mmol/L). Ask your health care provider what your LDL cholesterol should be. HDL cholesterol. An HDL level of 60 mg/dL (9.14 mmol/L) or higher is best because it helps to protect against heart disease. A number below 40 mg/dL (7.82 mmol/L) for men or below 50 mg/dL (9.56 mmol/L) for women increases the risk for heart disease. Triglycerides. A healthy triglyceride number is below 150 mg/dL (2.13 mmol/L). If your lipid profile is abnormal, your health care provider may do other blood tests. How is this treated? Treatment depends on the type of dyslipidemia that you have and your other risk factors for heart disease and stroke. Your health care provider will have a target range for your lipid levels based on this information. Treatment for dyslipidemia starts with lifestyle changes, such as diet and exercise. Your health care provider may recommend that you: Get regular exercise. Make changes to your diet. Quit smoking if you smoke. Limit your alcohol intake. If diet  changes and exercise do not help you reach your goals, your health care provider  may also prescribe medicine to lower lipids. The most commonly prescribed type of medicine lowers your LDL cholesterol (statin drug). If you have a high triglyceride level, your provider may prescribe another type of drug (fibrate) or an omega-3 fish oil supplement, or both. Follow these instructions at home: Eating and drinking  Follow instructions from your health care provider or dietitian about eating or drinking restrictions. Eat a healthy diet as told by your health care provider. This can help you reach and maintain a healthy weight, lower your LDL cholesterol, and raise your HDL cholesterol. This may include: Limiting your calories, if you are overweight. Eating more fruits, vegetables, whole grains, fish, and lean meats. Limiting saturated fat, trans fat, and cholesterol. Do not drink alcohol if: Your health care provider tells you not to drink. You are pregnant, may be pregnant, or are planning to become pregnant. If you drink alcohol: Limit how much you have to: 0-1 drink a day for women. 0-2 drinks a day for men. Know how much alcohol is in your drink. In the [place] one drink equals one 12 oz bottle of beer (355 mL), one 5 oz glass of wine (148 mL), or one 1 oz glass of hard liquor (44 mL). Activity Get regular exercise. Start an exercise and strength training program as told by your health care provider. Ask your health care provider what activities are safe for you. Your health care provider may recommend: 30 minutes of aerobic activity 4-6 days a week. Brisk walking is an example of aerobic activity. Strength training 2 days a week. General instructions Do not use any products that contain nicotine or tobacco. These products include cigarettes, chewing tobacco, and vaping devices, such as e-cigarettes. If you need help quitting, ask your health care provider. Take over-the-counter and prescription medicines only as told by your health care provider. This includes  supplements. Keep all follow-up visits. This is important. Contact a health care provider if: You are having trouble sticking to your exercise or diet plan. You are struggling to quit smoking or to control your use of alcohol. Summary Dyslipidemia often involves a high level of cholesterol or triglycerides, which are types of lipids. Treatment depends on the type of dyslipidemia that you have and your other risk factors for heart disease and stroke. Treatment for dyslipidemia starts with lifestyle changes, such as diet and exercise. Your health care provider may prescribe medicine to lower lipids. This information is not intended to replace advice given to you by your health care provider. Make sure you discuss any questions you have with your health care provider. Document Revised: 04/30/2022 Document Reviewed: 12/01/2020 Elsevier Patient Education  2024 [employer]Carbohydrate Counting for Diabetes Mellitus, Adult Carbohydrate counting is a method of keeping track of how many carbohydrates you eat. Eating carbohydrates increases the amount of sugar (glucose) in the blood. Counting how many carbohydrates you eat improves how well you manage your blood glucose. This, in turn, helps you manage your diabetes. Carbohydrates are measured in grams (g) per serving. It is important to know how many carbohydrates (in grams or by serving size) you can have in each meal. This is different for every person. A dietitian can help you make a meal plan and calculate how many carbohydrates you should have at each meal and snack. What foods contain carbohydrates? Carbohydrates are found in the following foods: Grains, such as breads and cereals. Dried beans  and soy products. Starchy vegetables, such as potatoes, peas, and corn. Fruit and fruit juices. Milk and yogurt. Sweets and snack foods, such as cake, cookies, candy, chips, and soft drinks. How do I count carbohydrates in foods? There are two ways to  count carbohydrates in food. You can read food labels or learn standard serving sizes of foods. You can use either of these methods or a combination of both. Using the Nutrition Facts label The Nutrition Facts list is included on the labels of almost all packaged foods and beverages in the [place] . It includes: The serving size. Information about nutrients in each serving, including the grams of carbohydrate per serving. To use the Nutrition Facts, decide how many servings you will have. Then, multiply the number of servings by the number of carbohydrates per serving. The resulting number is the total grams of carbohydrates that you will be having. Learning the standard serving sizes of foods When you eat carbohydrate foods that are not packaged or do not include Nutrition Facts on the label, you need to measure the servings in order to count the grams of carbohydrates. Measure the foods that you will eat with a food scale or measuring cup, if needed. Decide how many standard-size servings you will eat. Multiply the number of servings by 15. For foods that contain carbohydrates, one serving equals 15 g of carbohydrates. For example, if you eat 2 cups or 10 oz (300 g) of strawberries, you will have eaten 2 servings and 30 g of carbohydrates (2 servings x 15 g = 30 g). For foods that have more than one food mixed, such as soups and casseroles, you must count the carbohydrates in each food that is included. The following list contains standard serving sizes of common carbohydrate-rich foods. Each of these servings has about 15 g of carbohydrates: 1 slice of bread. 1 six-inch (15 cm) tortilla. ? cup or 2 oz (53 g) cooked rice or pasta.  cup or 3 oz (85 g) cooked or canned, drained and rinsed beans or lentils.  cup or 3 oz (85 g) starchy vegetable, such as peas, corn, or squash.  cup or 4 oz (120 g) hot cereal.  cup or 3 oz (85 g) boiled or mashed potatoes, or  or 3 oz (85 g) of a large  baked potato.  cup or 4 fl oz (118 mL) fruit juice. 1 cup or 8 fl oz (237 mL) milk. 1 small or 4 oz (106 g) apple.  or 2 oz (63 g) of a medium banana. 1 cup or 5 oz (150 g) strawberries. 3 cups or 1 oz (28.3 g) popped popcorn. What is an example of carbohydrate counting? To calculate the grams of carbohydrates in this sample meal, follow the steps shown below. Sample meal 3 oz (85 g) chicken breast. ? cup or 4 oz (106 g) brown rice.  cup or 3 oz (85 g) corn. 1 cup or 8 fl oz (237 mL) milk. 1 cup or 5 oz (150 g) strawberries with sugar-free whipped topping. Carbohydrate calculation Identify the foods that contain carbohydrates: Rice. Corn. Milk. Strawberries. Calculate how many servings you have of each food: 2 servings rice. 1 serving corn. 1 serving milk. 1 serving strawberries. Multiply each number of servings by 15 g: 2 servings rice x 15 g = 30 g. 1 serving corn x 15 g = 15 g. 1 serving milk x 15 g = 15 g. 1 serving strawberries x 15 g = 15 g. Add together  all of the amounts to find the total grams of carbohydrates eaten: 30 g + 15 g + 15 g + 15 g = 75 g of carbohydrates total. What are tips for following this plan? Shopping Develop a meal plan and then make a shopping list. Buy fresh and frozen vegetables, fresh and frozen fruit, dairy, eggs, beans, lentils, and whole grains. Look at food labels. Choose foods that have more fiber and less sugar. Avoid processed foods and foods with added sugars. Meal planning Aim to have the same number of grams of carbohydrates at each meal and for each snack time. Plan to have regular, balanced meals and snacks. Where to find more information American Diabetes Association: diabetes.org Centers for Disease Control and Prevention: TonerPromos.no Academy of Nutrition and Dietetics: eatright.org Association of Diabetes Care & Education Specialists: diabeteseducator.org Summary Carbohydrate counting is a method of keeping track of how  many carbohydrates you eat. Eating carbohydrates increases the amount of sugar (glucose) in your blood. Counting how many carbohydrates you eat improves how well you manage your blood glucose. This helps you manage your diabetes. A dietitian can help you make a meal plan and calculate how many carbohydrates you should have at each meal and snack. This information is not intended to replace advice given to you by your health care provider. Make sure you discuss any questions you have with your health care provider. Document Revised: 04/29/2020 Document Reviewed: 04/30/2020 Elsevier Patient Education  2024 [employer].

## 2024-03-14 LAB — SEDIMENTATION RATE: Sed Rate: 34 mm/h (ref 0–40)

## 2024-03-14 LAB — C-REACTIVE PROTEIN: CRP: 16 mg/L — ABNORMAL HIGH (ref 0–10)

## 2024-03-14 LAB — RHEUMATOID FACTOR: Rheumatoid fact SerPl-aCnc: <10 [IU]/mL (ref <14.0)

## 2024-03-15 ENCOUNTER — Ambulatory Visit: Payer: Self-pay | Admitting: Gerontology

## 2024-04-04 ENCOUNTER — Other Ambulatory Visit: Payer: Self-pay

## 2024-05-17 ENCOUNTER — Other Ambulatory Visit: Payer: Self-pay

## 2024-05-24 ENCOUNTER — Ambulatory Visit: Payer: Self-pay

## 2024-05-24 NOTE — Progress Notes (Deleted)
 [facility] Endocrinology Follow-up Visit  Assessment & Plan:   1. Type 2 diabetes mellitus without complication, without long-term current use of insulin  (HCC) (Primary) Her Ha1c is on target at 6.0% (June 2025), down from 6.8% in March 2025. Continue current regimen.  - Last foot exam:  - Last optho exam:  - Counseled on diet, exercise and healthy lifestyle changes.  - Continue Metformin  500 mg once daily - Continue Ozempic  1 mg weekly - Follow-up in 3 months  2. Elevated lipids -CONTINUE Pravastatin  20 mg - The 10-year ASCVD risk score (Arnett DK, et al., 2019) is: 3.9% -continue to monitor lipids -She will continue current treatment plan, low fat/cholesterol diet and exercise as tolerated.  3. Vitamin D  Deficiency -Continue 50,000 U cholecalciferol  weekly   Subjective   Patient ID: Lisa Howell, female    DOB: 07/17/1971  Age: 53 y.o. MRN: [id number]  No chief complaint on file.   HPI  Lisa Howell is a 53 y/o female with history of Type 2 Diabetes and hyperlipidemia who presents to the clinic for follow up visit. She states that she's compliant with her Metformin  500 mg and pravastatin  20 mg, denies side effects and continues to make healthy lifestyle changes. Exercise has been difficult after spraining her L knee/ankle. She is doing well on Ozempic  and denies any side effects related to the medication. Her HgbA1c checked during visit decreased to 6.0% (June 2024) to 6.8% (March 2025) and her blood glucose was 97 (down from 115 mg/dl, March 7975). Her weight has gone down 15 lbs to 220 lbs in June 2025 to 223 lbs in May 2025. She is monitoring her glucose reading twice a day before breakfast and before dinner. Her fasting blood glucose readings are below <120 mg/dl. She denies hypoglycemic and hyperglycemic symptoms, peripheral neuropathy and performs daily foot checks. Overall, she states that she's doing well and offers no further complaint.  She notes a history of  bone/colon cancer on her mother's side. No Hx of thyroid, kidney disease or liver disease.   Patient Active Problem List   Diagnosis Date Noted   Left knee pain 03/13/2024   Dermatitis associated with moisture 12/13/2023   Sinus pressure 06/02/2023   Thumb pain, left 03/17/2023   Skin lesion 02/03/2023   Unspecified chronic bronchitis (HCC) 11/11/2022   Nasal congestion 10/13/2022   Cough 09/15/2022   Wheezing 09/15/2022   Health care maintenance 03/17/2022   Encounter for screening colonoscopy    Polyp of transverse colon    Obesity 12/17/2020   Dental decay 09/11/2020   Elevated lipids 08/13/2020   Fluttering sensation of heart 08/13/2020   Encounter to establish care 07/10/2020   Type 2 diabetes mellitus without complications (HCC) 07/10/2020   Hospital discharge follow-up 07/10/2020   Pneumonia due to 2019-nCoV 05/27/2020   Chronic tension-type headache, not intractable 09/18/2015   Headache 02/25/2015   Past Medical History:  Diagnosis Date   Diabetes mellitus without complication (HCC)    Hyperlipidemia    Past Surgical History:  Procedure Laterality Date   ABDOMINAL HYSTERECTOMY     CESAREAN SECTION     CHOLECYSTECTOMY     COLONOSCOPY WITH PROPOFOL  N/A 12/30/2020   Procedure: COLONOSCOPY WITH PROPOFOL ;  Surgeon: Jinny Carmine, MD;  Location: [facility] ENDOSCOPY;  Service: Endoscopy;  Laterality: N/A;   HERNIA REPAIR  1998   c-section site   Social History   Tobacco Use   Smoking status: Former    Current packs/day: 0.00    Types:  Cigarettes    Start date: 12/30/1997    Quit date: 12/31/1999    Years since quitting: 24.4   Smokeless tobacco: Never  Vaping Use   Vaping status: Never Used  Substance Use Topics   Alcohol use: Yes    Comment: rarely, maybe 1 drink a year   Drug use: No   Family History  Problem Relation Age of Onset   Diabetes Mother    Heart disease Mother    Cancer Mother        bone   Diabetes Father    Cancer Father        colon    Diabetes Sister    Diabetes Brother    Heart disease Maternal Grandmother    Heart disease Maternal Grandfather    Dementia Paternal Grandmother    Alzheimer's disease Paternal Grandfather    Diabetes Half-Sister    Allergies  Allergen Reactions   Onion Anaphylaxis   Aspirin Other (See Comments)    Drowsy  Drowsy  Drowsy       Review of Systems  Constitutional: Negative.   Eyes: Negative.   Respiratory: Negative.    Cardiovascular: Negative.   Genitourinary: Negative.   Skin: Negative.   Neurological: Negative.   Endo/Heme/Allergies: Negative.   Psychiatric/Behavioral: Negative.        Objective:     There were no vitals taken for this visit. BP Readings from Last 3 Encounters:  03/13/24 109/76  02/16/24 (!) 143/80  01/19/24 129/86   Wt Readings from Last 3 Encounters:  03/13/24 220 lb 4.8 oz (99.9 kg)  02/16/24 223 lb 4.8 oz (101.3 kg)  01/19/24 224 lb (101.6 kg)      Physical Exam HENT:     Head: Normocephalic and atraumatic.     Mouth/Throat:     Mouth: Mucous membranes are moist.  Eyes:     Pupils: Pupils are equal, round, and reactive to light.  Cardiovascular:     Rate and Rhythm: Normal rate and regular rhythm.     Pulses: Normal pulses.     Heart sounds: Normal heart sounds.  Pulmonary:     Effort: Pulmonary effort is normal.     Breath sounds: Normal breath sounds.  Skin:    General: Skin is warm.     Capillary Refill: Capillary refill takes less than 2 seconds.  Neurological:     General: No focal deficit present.     Mental Status: She is alert and oriented to person, place, and time.  Psychiatric:        Mood and Affect: Mood normal.        Behavior: Behavior normal.    Physical Exam Cardiovascular:     RRR, normal pulses Pulmonary:     CTA bilaterally MSK:    Injection site without erythema and redness   No results found for any visits on 05/24/24.  Last CBC Lab Results  Component Value Date   WBC 9.5 12/13/2023   HGB  11.2 12/13/2023   HCT 35.4 12/13/2023   MCV 80 12/13/2023   MCH 25.2 (L) 12/13/2023   RDW 15.3 12/13/2023   PLT 358 12/13/2023   Last metabolic panel Lab Results  Component Value Date   GLUCOSE 109 (H) 12/13/2023   NA 140 12/13/2023   K 4.5 12/13/2023   CL 103 12/13/2023   CO2 22 12/13/2023   BUN 13 12/13/2023   CREATININE 0.69 12/13/2023   EGFR 102 12/13/2023   CALCIUM 9.3 12/13/2023   PROT  6.7 12/13/2023   ALBUMIN 4.0 12/13/2023   LABGLOB 2.7 12/13/2023   AGRATIO 1.2 12/15/2022   BILITOT 0.3 12/13/2023   ALKPHOS 120 12/13/2023   AST 15 12/13/2023   ALT 12 12/13/2023   ANIONGAP 12 05/31/2020   Last lipids Lab Results  Component Value Date   CHOL 190 12/13/2023   HDL 47 12/13/2023   LDLCALC 116 (H) 12/13/2023   TRIG 150 (H) 12/13/2023   CHOLHDL 4.0 12/13/2023   Last hemoglobin A1c Lab Results  Component Value Date   HGBA1C 6.0 (A) 03/13/2024   Last thyroid functions Lab Results  Component Value Date   TSH 0.931 12/09/2021   Last vitamin D  Lab Results  Component Value Date   VD25OH 14.5 (L) 12/13/2023   Last vitamin B12 and Folate No results found for: VITAMINB12, FOLATE    The 10-year ASCVD risk score (Arnett DK, et al., 2019) is: 3.9%      No follow-ups on file.    Avyon Herendeen S Maite Burlison, [place]

## 2024-06-14 ENCOUNTER — Encounter: Payer: Self-pay | Admitting: Gerontology

## 2024-06-14 ENCOUNTER — Other Ambulatory Visit: Payer: Self-pay

## 2024-06-14 ENCOUNTER — Ambulatory Visit: Payer: Self-pay | Admitting: Gerontology

## 2024-06-14 VITALS — BP 106/75 | HR 86 | Ht 61.0 in | Wt 212.0 lb

## 2024-06-14 DIAGNOSIS — E119 Type 2 diabetes mellitus without complications: Secondary | ICD-10-CM

## 2024-06-14 DIAGNOSIS — M25562 Pain in left knee: Secondary | ICD-10-CM

## 2024-06-14 LAB — POCT GLYCOSYLATED HEMOGLOBIN (HGB A1C): Hemoglobin A1C: 5.7 % — AB (ref 4.0–5.6)

## 2024-06-14 LAB — GLUCOSE, POCT (MANUAL RESULT ENTRY): POC Glucose: 100 mg/dL — AB (ref 70–99)

## 2024-06-14 MED ORDER — METFORMIN HCL ER 500 MG PO TB24
500.0000 mg | ORAL_TABLET | Freq: Every day | ORAL | 0 refills | Status: DC
  Filled 2024-06-14: qty 90, 90d supply, fill #0

## 2024-06-14 MED ORDER — SEMAGLUTIDE(0.25 OR 0.5MG/DOS) 2 MG/3ML ~~LOC~~ SOPN
0.5000 mg | PEN_INJECTOR | SUBCUTANEOUS | 1 refills | Status: DC
  Filled 2024-06-14: qty 3, 28d supply, fill #0
  Filled 2024-07-11 – 2024-07-12 (×2): qty 3, 28d supply, fill #1

## 2024-06-14 NOTE — Patient Instructions (Signed)

## 2024-06-14 NOTE — Progress Notes (Signed)
 Established Patient Office Visit  Subjective   Patient ID: Lisa Howell, female    DOB: 06-Jun-1971  Age: 53 y.o. MRN: [id number]  No chief complaint on file.   HPI  Lisa Howell is a 53 y/o female with history of Type 2 Diabetes and hyperlipidemia who presents to the clinic for follow up visit . She was seen by Orthopedic Surgery at  [facility] on 04/25/24, received Monovisc injection to left knee and followed up yesterday . She states that taking Ibuprofen  and wearing nee brace moderately relieves her pain. Her HgbA1c checked during visit decreased from 6% to 5.7%, and blood glucose 100 mg/dl. She states that she checks her fasting blood glucose  daily and  it ranges between 88-103 mg/dl. She endorses hypoglycemic symptoms daily since taking 1 mg Ozempic , especially when her blood glucose is between 72-80 mg/dl. Overall, she states that she's doing well and offers no further complaint.  Review of Systems  Constitutional: Negative.   Eyes: Negative.   Respiratory: Negative.    Cardiovascular: Negative.   Genitourinary: Negative.   Musculoskeletal:  Positive for joint pain (left knee pain).  Skin: Negative.   Neurological: Negative.   Endo/Heme/Allergies: Negative.   Psychiatric/Behavioral: Negative.        Objective:     BP 106/75   Pulse 86   Ht 5' 1 (1.549 m)   Wt 212 lb (96.2 kg)   SpO2 93%   BMI 40.06 kg/m  BP Readings from Last 3 Encounters:  06/14/24 106/75  03/13/24 109/76  02/16/24 (!) 143/80   Wt Readings from Last 3 Encounters:  06/14/24 212 lb (96.2 kg)  03/13/24 220 lb 4.8 oz (99.9 kg)  02/16/24 223 lb 4.8 oz (101.3 kg)      Physical Exam HENT:     Head: Normocephalic and atraumatic.     Mouth/Throat:     Mouth: Mucous membranes are moist.     Pharynx: Oropharynx is clear.  Eyes:     Extraocular Movements: Extraocular movements intact.     Pupils: Pupils are equal, round, and reactive to light.  Cardiovascular:     Rate and  Rhythm: Normal rate and regular rhythm.     Pulses: Normal pulses.     Heart sounds: Normal heart sounds.  Pulmonary:     Effort: Pulmonary effort is normal.     Breath sounds: Normal breath sounds.  Skin:    General: Skin is warm.  Neurological:     General: No focal deficit present.     Mental Status: She is alert and oriented to person, place, and time.  Psychiatric:        Mood and Affect: Mood normal.        Behavior: Behavior normal.        Thought Content: Thought content normal.        Judgment: Judgment normal.      Results for orders placed or performed in visit on 06/14/24  POCT Glucose (CBG)  Result Value Ref Range   POC Glucose 100 (A) 70 - 99 mg/dl  POCT HgB J8R  Result Value Ref Range   Hemoglobin A1C 5.7 (A) 4.0 - 5.6 %   HbA1c POC (<> result, manual entry)     HbA1c, POC (prediabetic range)     HbA1c, POC (controlled diabetic range)      Last CBC Lab Results  Component Value Date   WBC 9.5 12/13/2023   HGB 11.2 12/13/2023   HCT 35.4  12/13/2023   MCV 80 12/13/2023   MCH 25.2 (L) 12/13/2023   RDW 15.3 12/13/2023   PLT 358 12/13/2023   Last metabolic panel Lab Results  Component Value Date   GLUCOSE 109 (H) 12/13/2023   NA 140 12/13/2023   K 4.5 12/13/2023   CL 103 12/13/2023   CO2 22 12/13/2023   BUN 13 12/13/2023   CREATININE 0.69 12/13/2023   EGFR 102 12/13/2023   CALCIUM 9.3 12/13/2023   PROT 6.7 12/13/2023   ALBUMIN 4.0 12/13/2023   LABGLOB 2.7 12/13/2023   AGRATIO 1.2 12/15/2022   BILITOT 0.3 12/13/2023   ALKPHOS 120 12/13/2023   AST 15 12/13/2023   ALT 12 12/13/2023   ANIONGAP 12 05/31/2020   Last lipids Lab Results  Component Value Date   CHOL 190 12/13/2023   HDL 47 12/13/2023   LDLCALC 116 (H) 12/13/2023   TRIG 150 (H) 12/13/2023   CHOLHDL 4.0 12/13/2023   Last hemoglobin A1c Lab Results  Component Value Date   HGBA1C 5.7 (A) 06/14/2024   Last thyroid functions Lab Results  Component Value Date   TSH 0.931  12/09/2021   Last vitamin D  Lab Results  Component Value Date   VD25OH 14.5 (L) 12/13/2023   Last vitamin B12 and Folate No results found for: VITAMINB12, FOLATE    The 10-year ASCVD risk score (Arnett DK, et al., 2019) is: 2.9%    Assessment & Plan:   1. Type 2 diabetes mellitus without complication, without long-term current use of insulin  (HCC) (Primary) - Her diabetes is improved HgbA1c at 5.7%, her Ozempic  was decreased to 0.5 mg weekly, due to  increased activity and hypoglycemia . She was advised to continue checking her blood glucose and notify clinic for hypoglycemic episodes. Continue on low carb/non concentrated sweet diet and exercise as tolerated. - POCT Glucose (CBG) - POCT HgB A1C  2. Left knee pain, unspecified chronicity - Was encouraged to follow up at [facility] clinic Orthopedic and notify them for worsening symptoms.    Return in about 3 months (around 09/13/2024), or if symptoms worsen or fail to improve.    Kimba Lottes E Shaft Corigliano, NP

## 2024-07-09 ENCOUNTER — Other Ambulatory Visit: Payer: Self-pay

## 2024-07-11 ENCOUNTER — Other Ambulatory Visit: Payer: Self-pay

## 2024-07-12 ENCOUNTER — Other Ambulatory Visit: Payer: Self-pay

## 2024-07-14 ENCOUNTER — Other Ambulatory Visit: Payer: Self-pay

## 2024-08-01 ENCOUNTER — Other Ambulatory Visit: Payer: Self-pay

## 2024-08-09 ENCOUNTER — Other Ambulatory Visit: Payer: Self-pay | Admitting: Gerontology

## 2024-08-09 ENCOUNTER — Other Ambulatory Visit: Payer: Self-pay

## 2024-08-09 DIAGNOSIS — E119 Type 2 diabetes mellitus without complications: Secondary | ICD-10-CM

## 2024-08-10 ENCOUNTER — Other Ambulatory Visit: Payer: Self-pay

## 2024-08-10 MED FILL — Semaglutide Soln Pen-inj 0.25 or 0.5 MG/DOSE (2 MG/3ML): SUBCUTANEOUS | 28 days supply | Qty: 3 | Fill #0 | Status: AC

## 2024-08-13 ENCOUNTER — Encounter: Payer: Self-pay | Admitting: Radiology

## 2024-09-03 ENCOUNTER — Other Ambulatory Visit: Payer: Self-pay

## 2024-09-03 MED FILL — Semaglutide Soln Pen-inj 0.25 or 0.5 MG/DOSE (2 MG/3ML): SUBCUTANEOUS | 28 days supply | Qty: 3 | Fill #1 | Status: AC

## 2024-09-03 MED FILL — Semaglutide Soln Pen-inj 0.25 or 0.5 MG/DOSE (2 MG/3ML): SUBCUTANEOUS | 28 days supply | Qty: 3 | Fill #1 | Status: CN

## 2024-09-04 ENCOUNTER — Other Ambulatory Visit: Payer: Self-pay | Admitting: Gerontology

## 2024-09-04 ENCOUNTER — Other Ambulatory Visit: Payer: Self-pay

## 2024-09-05 ENCOUNTER — Other Ambulatory Visit: Payer: Self-pay

## 2024-09-07 ENCOUNTER — Other Ambulatory Visit: Payer: Self-pay

## 2024-09-13 ENCOUNTER — Other Ambulatory Visit: Payer: Self-pay

## 2024-09-13 ENCOUNTER — Ambulatory Visit: Payer: Self-pay | Admitting: Gerontology

## 2024-09-13 VITALS — BP 113/80 | HR 86 | Ht 63.0 in | Wt 212.0 lb

## 2024-09-13 DIAGNOSIS — E119 Type 2 diabetes mellitus without complications: Secondary | ICD-10-CM

## 2024-09-13 DIAGNOSIS — Z0000 Encounter for general adult medical examination without abnormal findings: Secondary | ICD-10-CM

## 2024-09-13 LAB — POCT GLYCOSYLATED HEMOGLOBIN (HGB A1C): Hemoglobin A1C: 5.9 % — AB (ref 4.0–5.6)

## 2024-09-13 LAB — GLUCOSE, POCT (MANUAL RESULT ENTRY): POC Glucose: 92 mg/dL (ref 70–99)

## 2024-09-13 MED ORDER — METFORMIN HCL ER 500 MG PO TB24
500.0000 mg | ORAL_TABLET | Freq: Every day | ORAL | 0 refills | Status: AC
  Filled 2024-09-13: qty 90, 90d supply, fill #0

## 2024-09-13 NOTE — Patient Instructions (Signed)

## 2024-09-13 NOTE — Progress Notes (Signed)
 Established Patient Office Visit  Subjective   Patient ID: Lisa Howell, female    DOB: [birth year]  Age: 53 y.o. MRN: [id number]  Chief Complaint  Patient presents with   Follow-up    HPI  ennifer D Lisbon is a 53 y/o female with history of Type 2 Diabetes and hyperlipidemia who presents to the clinic for follow up visit .  She states that she is compliant with her medications, denies side effects and continues to make healthy lifestyle changes.  Her blood HgbA1c checked during visit increased from 5.7% to 5.9 % and blood glucose was 92 mg/dl. She states that she checks her blood sugar once in a while and her highest has been less than 140 mg per DL.  She denies hypo-/hyperglycemic symptoms and peripheral neuropathy and performs daily foot checks.  She has not had mammogram no Pap smear done.  Overall, she states that she is doing well and offers no further complaints.    Patient Active Problem List   Diagnosis Date Noted   Left knee pain 03/13/2024   Dermatitis associated with moisture 12/13/2023   Sinus pressure 06/02/2023   Thumb pain, left 03/17/2023   Skin lesion 02/03/2023   Unspecified chronic bronchitis (HCC) 11/11/2022   Nasal congestion 10/13/2022   Cough 09/15/2022   Wheezing 09/15/2022   Health care maintenance 03/17/2022   Encounter for screening colonoscopy    Polyp of transverse colon    Obesity 12/17/2020   Dental decay 09/11/2020   Elevated lipids 08/13/2020   Fluttering sensation of heart 08/13/2020   Encounter to establish care 07/10/2020   Type 2 diabetes mellitus without complications (HCC) 07/10/2020   Hospital discharge follow-up 07/10/2020   Pneumonia due to 2019-nCoV 05/27/2020   Chronic tension-type headache, not intractable 09/18/2015   Headache 02/25/2015   Past Medical History:  Diagnosis Date   Diabetes mellitus without complication (HCC)    Hyperlipidemia    Past Surgical History:  Procedure Laterality Date   ABDOMINAL  HYSTERECTOMY     CESAREAN SECTION     CHOLECYSTECTOMY     COLONOSCOPY WITH PROPOFOL  N/A 12/30/2020   Procedure: COLONOSCOPY WITH PROPOFOL ;  Surgeon: Jinny Carmine, MD;  Location: [facility] ENDOSCOPY;  Service: Endoscopy;  Laterality: N/A;   HERNIA REPAIR  1998   c-section site   Social History   Tobacco Use   Smoking status: Former    Current packs/day: 0.00    Types: Cigarettes    Start date: 12/30/1997    Quit date: 12/31/1999    Years since quitting: 24.7   Smokeless tobacco: Never  Vaping Use   Vaping status: Never Used  Substance Use Topics   Alcohol use: Yes    Comment: rarely, maybe 1 drink a year   Drug use: No   Family History  Problem Relation Age of Onset   Diabetes Mother    Heart disease Mother    Cancer Mother        bone   Diabetes Father    Cancer Father        colon   Diabetes Sister    Diabetes Brother    Heart disease Maternal Grandmother    Heart disease Maternal Grandfather    Dementia Paternal Grandmother    Alzheimer's disease Paternal Grandfather    Diabetes Half-Sister    Allergies  Allergen Reactions   Onion Anaphylaxis   Aspirin Other (See Comments)    Drowsy  Drowsy  Drowsy       Review of  Systems  Constitutional: Negative.   Eyes: Negative.   Respiratory: Negative.    Cardiovascular: Negative.   Skin: Negative.   Neurological: Negative.   Endo/Heme/Allergies: Negative.       Objective:     BP 113/80   Pulse 86   Wt 212 lb (96.2 kg)   SpO2 93%   BMI 40.06 kg/m  BP Readings from Last 3 Encounters:  09/13/24 113/80  06/14/24 106/75  03/13/24 109/76   Wt Readings from Last 3 Encounters:  09/13/24 212 lb (96.2 kg)  06/14/24 212 lb (96.2 kg)  03/13/24 220 lb 4.8 oz (99.9 kg)      Physical Exam HENT:     Head: Normocephalic and atraumatic.     Mouth/Throat:     Mouth: Mucous membranes are moist.     Pharynx: Oropharynx is clear.  Eyes:     Extraocular Movements: Extraocular movements intact.      Conjunctiva/sclera: Conjunctivae normal.     Pupils: Pupils are equal, round, and reactive to light.  Cardiovascular:     Rate and Rhythm: Normal rate and regular rhythm.     Pulses: Normal pulses.     Heart sounds: Normal heart sounds.  Pulmonary:     Effort: Pulmonary effort is normal.     Breath sounds: Normal breath sounds.  Skin:    General: Skin is warm.     Capillary Refill: Capillary refill takes less than 2 seconds.  Neurological:     General: No focal deficit present.     Mental Status: She is alert and oriented to person, place, and time.  Psychiatric:        Mood and Affect: Mood normal.        Behavior: Behavior normal.        Thought Content: Thought content normal.        Judgment: Judgment normal.      Results for orders placed or performed in visit on 09/13/24  POCT Glucose (CBG)  Result Value Ref Range   POC Glucose 92 70 - 99 mg/dl  POCT HgB J8R  Result Value Ref Range   Hemoglobin A1C 5.9 (A) 4.0 - 5.6 %   HbA1c POC (<> result, manual entry)     HbA1c, POC (prediabetic range)     HbA1c, POC (controlled diabetic range)      Last CBC Lab Results  Component Value Date   WBC 9.5 12/13/2023   HGB 11.2 12/13/2023   HCT 35.4 12/13/2023   MCV 80 12/13/2023   MCH 25.2 (L) 12/13/2023   RDW 15.3 12/13/2023   PLT 358 12/13/2023   Last metabolic panel Lab Results  Component Value Date   GLUCOSE 109 (H) 12/13/2023   NA 140 12/13/2023   K 4.5 12/13/2023   CL 103 12/13/2023   CO2 22 12/13/2023   BUN 13 12/13/2023   CREATININE 0.69 12/13/2023   EGFR 102 12/13/2023   CALCIUM 9.3 12/13/2023   PROT 6.7 12/13/2023   ALBUMIN 4.0 12/13/2023   LABGLOB 2.7 12/13/2023   AGRATIO 1.2 12/15/2022   BILITOT 0.3 12/13/2023   ALKPHOS 120 12/13/2023   AST 15 12/13/2023   ALT 12 12/13/2023   ANIONGAP 12 05/31/2020   Last lipids Lab Results  Component Value Date   CHOL 190 12/13/2023   HDL 47 12/13/2023   LDLCALC 116 (H) 12/13/2023   TRIG 150 (H) 12/13/2023    CHOLHDL 4.0 12/13/2023   Last hemoglobin A1c Lab Results  Component Value Date  HGBA1C 5.9 (A) 09/13/2024   Last thyroid functions Lab Results  Component Value Date   TSH 0.931 12/09/2021   Last vitamin D  Lab Results  Component Value Date   VD25OH 14.5 (L) 12/13/2023   Last vitamin B12 and Folate No results found for: VITAMINB12, FOLATE    The 10-year ASCVD risk score (Arnett DK, et al., 2019) is: 3.3%    Assessment & Plan:   1. Type 2 diabetes mellitus without complication, without long-term current use of insulin  (HCC) (Primary) - Her diabetes is under control, she agreed to continue Metformin , low carb/non concentrated sweet diet and exercise as tolerated. - POCT HgB A1C; Future - POCT Glucose (CBG); Future - metFORMIN  (GLUCOPHAGE -XR) 500 MG 24 hr tablet; Take 1 tablet (500 mg total) by mouth daily with breakfast.  Dispense: 90 tablet; Refill: 0 - Microalbumin / creatinine urine ratio; Future - Hemoglobin A1c; Future - POCT Glucose (CBG) - POCT HgB A1C  2. Health care maintenance - Routine labs will be checked and sent referral for her to be scheduled for Mammogram and Pap smear. - CBC w/Diff; Future - Lipid panel; Future - Comp Met (CMET); Future - Urinalysis; Future    Return in about 13 weeks (around 12/13/2024), or if symptoms worsen or fail to improve.    Srinika Delone E Massiah Longanecker, NP

## 2024-09-23 ENCOUNTER — Emergency Department: Payer: Self-pay

## 2024-09-23 ENCOUNTER — Emergency Department
Admission: EM | Admit: 2024-09-23 | Discharge: 2024-09-23 | Disposition: A | Discharge status: Home / Self Care | Payer: Self-pay

## 2024-09-23 ENCOUNTER — Other Ambulatory Visit: Payer: Self-pay

## 2024-09-23 ENCOUNTER — Encounter: Payer: Self-pay | Admitting: Emergency Medicine

## 2024-09-23 DIAGNOSIS — M542 Cervicalgia: Secondary | ICD-10-CM | POA: Diagnosis present

## 2024-09-23 DIAGNOSIS — E119 Type 2 diabetes mellitus without complications: Secondary | ICD-10-CM | POA: Insufficient documentation

## 2024-09-23 DIAGNOSIS — G44209 Tension-type headache, unspecified, not intractable: Secondary | ICD-10-CM | POA: Insufficient documentation

## 2024-09-23 DIAGNOSIS — Y92410 Unspecified street and highway as the place of occurrence of the external cause: Secondary | ICD-10-CM | POA: Diagnosis not present

## 2024-09-23 MED ORDER — HYDROCODONE-ACETAMINOPHEN 5-325 MG PO TABS
1.0000 | ORAL_TABLET | Freq: Three times a day (TID) | ORAL | 0 refills | Status: AC | PRN
  Filled 2024-09-23: qty 6, 2d supply, fill #0

## 2024-09-23 MED ORDER — HYDROCODONE-ACETAMINOPHEN 5-325 MG PO TABS
1.0000 | ORAL_TABLET | Freq: Once | ORAL | Status: AC
  Administered 2024-09-23: 1 via ORAL
  Filled 2024-09-23: qty 1

## 2024-09-23 MED ORDER — CYCLOBENZAPRINE HCL 10 MG PO TABS
10.0000 mg | ORAL_TABLET | Freq: Once | ORAL | Status: AC
  Administered 2024-09-23: 10 mg via ORAL
  Filled 2024-09-23: qty 1

## 2024-09-23 MED ORDER — CYCLOBENZAPRINE HCL 5 MG PO TABS
5.0000 mg | ORAL_TABLET | Freq: Three times a day (TID) | ORAL | 0 refills | Status: AC | PRN
  Filled 2024-09-23: qty 15, 5d supply, fill #0
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
## 2024-09-23 NOTE — ED Notes (Signed)
 No imaging orders at this time per [place], [place].

## 2024-09-23 NOTE — ED Notes (Signed)
 Patient provided discharge teaching and inquired about scans to head, neck and back. New orders placed

## 2024-09-24 ENCOUNTER — Other Ambulatory Visit: Payer: Self-pay

## 2024-09-25 ENCOUNTER — Other Ambulatory Visit: Payer: Self-pay

## 2024-10-09 ENCOUNTER — Other Ambulatory Visit: Payer: Self-pay

## 2024-10-09 MED ORDER — ERYTHROMYCIN 5 MG/GM OP OINT
TOPICAL_OINTMENT | Freq: Four times a day (QID) | OPHTHALMIC | 0 refills | Status: AC
  Filled 2024-10-09: qty 3.5, 5d supply, fill #0

## 2024-10-12 ENCOUNTER — Emergency Department: Payer: Worker's Compensation

## 2024-10-12 ENCOUNTER — Other Ambulatory Visit: Payer: Self-pay

## 2024-10-12 ENCOUNTER — Emergency Department
Admission: EM | Admit: 2024-10-12 | Discharge: 2024-10-12 | Disposition: A | Discharge status: Home / Self Care | Payer: Worker's Compensation | Source: Home / Self Care | Attending: Emergency Medicine | Admitting: Emergency Medicine

## 2024-10-12 DIAGNOSIS — Y990 Civilian activity done for income or pay: Secondary | ICD-10-CM | POA: Diagnosis not present

## 2024-10-12 DIAGNOSIS — W010XXA Fall on same level from slipping, tripping and stumbling without subsequent striking against object, initial encounter: Secondary | ICD-10-CM | POA: Insufficient documentation

## 2024-10-12 DIAGNOSIS — M25562 Pain in left knee: Secondary | ICD-10-CM | POA: Diagnosis present

## 2024-10-12 DIAGNOSIS — W19XXXA Unspecified fall, initial encounter: Secondary | ICD-10-CM

## 2024-10-12 DIAGNOSIS — E119 Type 2 diabetes mellitus without complications: Secondary | ICD-10-CM | POA: Diagnosis not present

## 2024-10-12 MED ORDER — OXYCODONE-ACETAMINOPHEN 5-325 MG PO TABS
1.0000 | ORAL_TABLET | ORAL | 0 refills | Status: AC | PRN
  Filled 2024-10-12: qty 8, 2d supply, fill #0

## 2024-10-12 MED ORDER — OXYCODONE-ACETAMINOPHEN 5-325 MG PO TABS
2.0000 | ORAL_TABLET | Freq: Once | ORAL | Status: AC
  Administered 2024-10-12: 2 via ORAL
  Filled 2024-10-12: qty 2

## 2024-10-12 NOTE — ED Triage Notes (Signed)
 Pt arrived via [facility] from work d/t fall. Pt states that she slipped on water at work, with right leg slipping out forward and pt landing on left knee. Pt states that left knee is previously injured. EMS reports vital signs all WNL.   Pt AO x4 upon arrival. Pt states pain in left knee is 10/10.

## 2024-10-12 NOTE — ED Provider Notes (Signed)
 "  [facility] Provider Note   None    (approximate) History  Fall  HPI Lisa Howell is a 54 y.o. female with a stated past medical history of type 2 diabetes, hyperlipidemia, obesity, and severe arthritis of the left knee who presents after slipping on ice at work falling onto a bent knee on the left.  Patient states that she felt a deformity in this knee as well as significant 10/10 pain that is worsened with any movement at the left knee.  Patient endorses intact sensation and movement to the ankle and foot on this left side.  EMS placed this left leg in a moldable cast and appreciated good distal pulses ROS: Patient currently denies any vision changes, tinnitus, difficulty speaking, facial droop, sore throat, chest pain, shortness of breath, abdominal pain, nausea/vomiting/diarrhea, dysuria, or weakness/numbness/paresthesias in any extremity   Physical Exam  Triage Vital Signs: ED Triage Vitals  Encounter Vitals Group     BP      Girls Systolic BP Percentile      Girls Diastolic BP Percentile      Boys Systolic BP Percentile      Boys Diastolic BP Percentile      Pulse      Resp      Temp      Temp src      SpO2      Weight      Height      Head Circumference      Peak Flow      Pain Score      Pain Loc      Pain Education      Exclude from Growth Chart    Most recent vital signs: Vitals:   10/12/24 1314  BP: 135/84  Pulse: (!) 103  Resp: 18  Temp: 98.5 F (36.9 C)  SpO2: 97%   General: Awake, oriented x4. CV:  Good peripheral perfusion. Resp:  Normal effort. Abd:  No distention. Other:  Middle-aged obese Caucasian female resting comfortably in no acute distress.  Moldable cast to left lower extremity overlying the knee.  Tenderness to palpation to the anterior joint line as well as over the patella ED Results / Procedures / Treatments  Labs (all labs ordered are listed, but only abnormal results are displayed) Labs Reviewed - No  data to display RADIOLOGY ED MD interpretation: X-ray of the left knee shows no evidence of acute abnormalities - All radiology independently interpreted and agree with radiology assessment Official radiology report(s): DG Knee Complete 4 Views Left Result Date: 10/12/2024 EXAM: 4 VIEW(S) XRAY OF THE LEFT KNEE 10/12/2024 01:33:00 PM COMPARISON: 12/14/2023 CLINICAL HISTORY: fall w/ deformity and 10/10 pain to anterior L knee FINDINGS: BONES AND JOINTS: No acute fracture. No malalignment. No significant joint effusion. Tricompartmental osteoarthritis with peripheral spurring and mild medial tibiofemoral joint space narrowing. SOFT TISSUES: The soft tissues are unremarkable. IMPRESSION: 1. No acute fracture or dislocation. Electronically signed by: Morgane Naveau MD 10/12/2024 02:32 PM EST RP Workstation: [id number]   PROCEDURES: Critical Care performed: No Procedures MEDICATIONS ORDERED IN ED: Medications  oxyCODONE -acetaminophen  (PERCOCET/ROXICET) 5-325 MG per tablet 2 tablet (2 tablets Oral Given 10/12/24 1318)   IMPRESSION / MDM / ASSESSMENT AND PLAN / ED COURSE  I reviewed the triage vital signs and the nursing notes.                             The  patient is on the cardiac monitor to evaluate for evidence of arrhythmia and/or significant heart rate changes. Patient's presentation is most consistent with acute presentation with potential threat to life or bodily function. Patient is a 54 year old female who presents via EMS after a mechanical fall at work complaining of left knee pain.  Patient arrives in multiple cast with intact distal pulses.  X-ray of the left knee shows no evidence of acute abnormalities.  Patient is minimally ambulatory on this leg however pain is better controlled after oxycodone .  Patient placed in knee immobilizer as well as a walker as requested by patient.  Patient was offered crutches and states that she does not feel stable on them.  Patient was informed that she  would likely need to follow-up with orthopedics for an MRI if this pain/inability to tolerate ambulation does not improve.  Patient states that she already sees Medford Amber in orthopedics and would like to follow-up with him.  Patient given strict return precautions and all questions answered prior to discharge.  Dispo: Discharge home with orthopedic follow-up   FINAL CLINICAL IMPRESSION(S) / ED DIAGNOSES   Final diagnoses:  Fall, initial encounter  Acute pain of left knee   Rx / DC Orders   ED Discharge Orders          Ordered    oxyCODONE -acetaminophen  (PERCOCET) 5-325 MG tablet  Every 4 hours PRN        10/12/24 1444           Note:  This document was prepared using Dragon voice recognition software and may include unintentional dictation errors.   Montrelle Eddings K, MD 10/12/24 1446  "

## 2024-10-30 ENCOUNTER — Other Ambulatory Visit: Payer: Self-pay

## 2024-12-04 ENCOUNTER — Other Ambulatory Visit: Payer: Self-pay

## 2024-12-11 ENCOUNTER — Ambulatory Visit: Payer: Self-pay | Admitting: Gerontology
